# Patient Record
Sex: Male | Born: 1945 | Race: Black or African American | Hispanic: No | Marital: Married | Smoking: Never smoker
Health system: Southern US, Community
[De-identification: ages and names within clinical notes are randomized; demographics above are authoritative.]

## PROBLEM LIST (undated history)

## (undated) DIAGNOSIS — D649 Anemia, unspecified: Secondary | ICD-10-CM

## (undated) DIAGNOSIS — I10 Essential (primary) hypertension: Secondary | ICD-10-CM

---

## 2012-04-15 ENCOUNTER — Encounter: Payer: Self-pay | Admitting: Family Medicine

## 2012-04-15 ENCOUNTER — Ambulatory Visit (INDEPENDENT_AMBULATORY_CARE_PROVIDER_SITE_OTHER): Payer: Self-pay | Admitting: Family Medicine

## 2012-04-15 VITALS — BP 154/80 | HR 67 | Ht 69.75 in | Wt 198.0 lb

## 2012-04-15 DIAGNOSIS — I10 Essential (primary) hypertension: Secondary | ICD-10-CM

## 2012-04-15 LAB — COMPREHENSIVE METABOLIC PANEL
ALT: 19 U/L (ref 0–53)
AST: 23 U/L (ref 0–37)
Alkaline Phosphatase: 76 U/L (ref 39–117)
BUN: 13 mg/dL (ref 6–23)
Calcium: 9.1 mg/dL (ref 8.4–10.5)
Chloride: 105 mEq/L (ref 96–112)
Creat: 0.97 mg/dL (ref 0.50–1.35)

## 2012-04-15 MED ORDER — TRIAMTERENE-HCTZ 37.5-25 MG PO CAPS: 1.0000 | ORAL_CAPSULE | Freq: Every day | ORAL | Status: DC

## 2012-04-15 MED ORDER — ASPIRIN 81 MG PO TBEC: 81.0000 mg | DELAYED_RELEASE_TABLET | Freq: Every day | ORAL | Status: AC

## 2012-04-15 NOTE — Patient Instructions (Signed)
1.5 Gram Low Sodium Diet A 1.5 gram sodium diet restricts the amount of sodium in the diet to no more than 1.5 g or 1500 mg daily. The American Heart Association recommends Americans over the age of 20 to consume no more than 1500 mg of sodium each day to reduce the risk of developing high blood pressure. Research also shows that limiting sodium may reduce heart attack and stroke risk. Many foods contain sodium for flavor and sometimes as a preservative. When the amount of sodium in a diet needs to be low, it is important to know what to look for when choosing foods and drinks. The following includes some information and guidelines to help make it easier for you to adapt to a low sodium diet. QUICK TIPS  Do not add salt to food.   Avoid convenience items and fast food.   Choose unsalted snack foods.   Buy lower sodium products, often labeled as "lower sodium" or "no salt added."   Check food labels to learn how much sodium is in 1 serving.   When eating at a restaurant, ask that your food be prepared with less salt or none, if possible.  READING FOOD LABELS FOR SODIUM INFORMATION The nutrition facts label is a good place to find how much sodium is in foods. Look for products with no more than 400 mg of sodium per serving. Remember that 1.5 g = 1500 mg. The food label may also list foods as:  Sodium-free: Less than 5 mg in a serving.   Very low sodium: 35 mg or less in a serving.   Low-sodium: 140 mg or less in a serving.   Light in sodium: 50% less sodium in a serving. For example, if a food that usually has 300 mg of sodium is changed to become light in sodium, it will have 150 mg of sodium.   Reduced sodium: 25% less sodium in a serving. For example, if a food that usually has 400 mg of sodium is changed to reduced sodium, it will have 300 mg of sodium.  CHOOSING FOODS Grains  Avoid: Salted crackers and snack items. Some cereals, including instant hot cereals. Bread stuffing and  biscuit mixes. Seasoned rice or pasta mixes.   Choose: Unsalted snack items. Low-sodium cereals, oats, puffed wheat and rice, shredded wheat. English muffins and bread. Pasta.  Meats  Avoid: Salted, canned, smoked, spiced, pickled meats, including fish and poultry. Bacon, ham, sausage, cold cuts, hot dogs, anchovies.   Choose: Low-sodium canned tuna and salmon. Fresh or frozen meat, poultry, and fish.  Dairy  Avoid: Processed cheese and spreads. Cottage cheese. Buttermilk and condensed milk. Regular cheese.   Choose: Milk. Low-sodium cottage cheese. Yogurt. Sour cream. Low-sodium cheese.  Fruits and Vegetables  Avoid: Regular canned vegetables. Regular canned tomato sauce and paste. Frozen vegetables in sauces. Olives. Pickles. Relishes. Sauerkraut.   Choose: Low-sodium canned vegetables. Low-sodium tomato sauce and paste. Frozen or fresh vegetables. Fresh and frozen fruit.  Condiments  Avoid: Canned and packaged gravies. Worcestershire sauce. Tartar sauce. Barbecue sauce. Soy sauce. Steak sauce. Ketchup. Onion, garlic, and table salt. Meat flavorings and tenderizers.   Choose: Fresh and dried herbs and spices. Low-sodium varieties of mustard and ketchup. Lemon juice. Tabasco sauce. Horseradish.  SAMPLE 1.5 GRAM SODIUM MEAL PLAN Breakfast / Sodium (mg)  1 cup low-fat milk / 143 mg   1 whole-wheat English muffin / 240 mg   1 tbs heart-healthy margarine / 153 mg   1 hard-boiled egg /   139 mg   1 small orange / 0 mg  Lunch / Sodium (mg)  1 cup raw carrots / 76 mg   2 tbs no salt added peanut butter / 5 mg   2 slices whole-wheat bread / 270 mg   1 tbs jelly / 6 mg    cup red grapes / 2 mg  Dinner / Sodium (mg)  1 cup whole-wheat pasta / 2 mg   1 cup low-sodium tomato sauce / 73 mg   3 oz lean ground beef / 57 mg   1 small side salad (1 cup raw spinach leaves,  cup cucumber,  cup yellow bell pepper) with 1 tsp olive oil and 1 tsp red wine vinegar / 25 mg  Snack /  Sodium (mg)  1 container low-fat vanilla yogurt / 107 mg   3 graham cracker squares / 127 mg  Nutrient Analysis  Calories: 1745   Protein: 75 g   Carbohydrate: 237 g   Fat: 57 g   Sodium: 1425 mg  Document Released: 11/26/2005 Document Revised: 11/15/2011 Document Reviewed: 02/27/2010 [facility] Patient Information 2012 [place], [place].  Hypertension As your heart beats, it forces blood through your arteries. This force is your blood pressure. If the pressure is too high, it is called hypertension (HTN) or high blood pressure. HTN is dangerous because you may have it and not know it. High blood pressure may mean that your heart has to work harder to pump blood. Your arteries may be narrow or stiff. The extra work puts you at risk for heart disease, stroke, and other problems.  Blood pressure consists of two numbers, a higher number over a lower, 110/72, for example. It is stated as "110 over 72." The ideal is below 120 for the top number (systolic) and under 80 for the bottom (diastolic). Write down your blood pressure today. You should pay close attention to your blood pressure if you have certain conditions such as:  Heart failure.   Prior heart attack.   Diabetes   Chronic kidney disease.   Prior stroke.   Multiple risk factors for heart disease.  To see if you have HTN, your blood pressure should be measured while you are seated with your arm held at the level of the heart. It should be measured at least twice. A one-time elevated blood pressure reading (especially in the Emergency Department) does not mean that you need treatment. There may be conditions in which the blood pressure is different between your right and left arms. It is important to see your caregiver soon for a recheck. Most people have essential hypertension which means that there is not a specific cause. This type of high blood pressure may be lowered by changing lifestyle factors such as:  Stress.    Smoking.   Lack of exercise.   Excessive weight.   Drug/tobacco/alcohol use.   Eating less salt.  Most people do not have symptoms from high blood pressure until it has caused damage to the body. Effective treatment can often prevent, delay or reduce that damage. TREATMENT  When a cause has been identified, treatment for high blood pressure is directed at the cause. There are a large number of medications to treat HTN. These fall into several categories, and your caregiver will help you select the medicines that are best for you. Medications may have side effects. You should review side effects with your caregiver. If your blood pressure stays high after you have made lifestyle changes or started  on medicines,   Your medication(s) may need to be changed.   Other problems may need to be addressed.   Be certain you understand your prescriptions, and know how and when to take your medicine.   Be sure to follow up with your caregiver within the time frame advised (usually within two weeks) to have your blood pressure rechecked and to review your medications.   If you are taking more than one medicine to lower your blood pressure, make sure you know how and at what times they should be taken. Taking two medicines at the same time can result in blood pressure that is too low.  SEEK IMMEDIATE MEDICAL CARE IF:  You develop a severe headache, blurred or changing vision, or confusion.   You have unusual weakness or numbness, or a faint feeling.   You have severe chest or abdominal pain, vomiting, or breathing problems.  MAKE SURE YOU:   Understand these instructions.   Will watch your condition.   Will get help right away if you are not doing well or get worse.  Document Released: 11/26/2005 Document Revised: 11/15/2011 Document Reviewed: 07/16/2008 [facility] Patient Information 2012 [place], [place].

## 2012-04-21 NOTE — Progress Notes (Signed)
  Subjective:    Patient ID: Frank Daugherty, male    DOB: 1946/12/07, 66 y.o.   MRN: [id number]  HPI Pt presents today as a new pt visit.  Pt is pending [facility] set up.  No acute issues currently.   Has had informal diagnosis of HTN in the past per pt by previous physician. Has not been on medications for this in the past.  Has not been checknig BPs daily Does report some mild high salt intake.  No HA, CP, SOB.  No other past medical history.    Review of Systems See HPI, otherwise ROS negative.     Objective:   Physical Exam Gen: up in chair, NAD HEENT: NCAT, EOMI, TMs clear bilaterally CV: RRR, no murmurs auscultated PULM: CTAB, no wheezes, rales, rhoncii ABD: S/NT/+ bowel sounds  EXT: 2+ peripheral pulses         Assessment & Plan:

## 2012-04-23 ENCOUNTER — Encounter: Payer: Self-pay | Admitting: Family Medicine

## 2012-04-23 ENCOUNTER — Ambulatory Visit (INDEPENDENT_AMBULATORY_CARE_PROVIDER_SITE_OTHER): Payer: Self-pay | Admitting: Family Medicine

## 2012-04-23 VITALS — BP 127/83 | HR 87 | Temp 98.3°F | Ht 69.75 in | Wt 194.5 lb

## 2012-04-23 DIAGNOSIS — I10 Essential (primary) hypertension: Secondary | ICD-10-CM | POA: Insufficient documentation

## 2012-04-23 DIAGNOSIS — E785 Hyperlipidemia, unspecified: Secondary | ICD-10-CM

## 2012-04-23 LAB — BASIC METABOLIC PANEL
BUN: 23 mg/dL (ref 6–23)
CO2: 26 mEq/L (ref 19–32)
Chloride: 102 mEq/L (ref 96–112)
Creat: 1 mg/dL (ref 0.50–1.35)
Glucose, Bld: 116 mg/dL — ABNORMAL HIGH (ref 70–99)
Potassium: 4.2 mEq/L (ref 3.5–5.3)

## 2012-04-23 MED ORDER — PRAVASTATIN SODIUM 40 MG PO TABS: 40.0000 mg | ORAL_TABLET | Freq: Every day | ORAL | Status: DC

## 2012-04-23 NOTE — Progress Notes (Signed)
Subjective:  Pt is here for hypertension follow up: Pt is pending [employer] set up.  Hypertension: Checking BPS Daily ?:no BP Range:no Any HA, CP, SOB?:no Any Medication Side Effects:no Medication Compliance:yes Salt intake?:low Exercise?:no Weight Loss?:no    Review of Systems - Negative except as noted above in HPI   Objective:  Current Outpatient Prescriptions  Medication Sig Dispense Refill  . aspirin 81 MG EC tablet Take 1 tablet (81 mg total) by mouth daily. Swallow whole.  30 tablet  12  . pravastatin (PRAVACHOL) 40 MG tablet Take 1 tablet (40 mg total) by mouth daily.  90 tablet  3  . triamterene-hydrochlorothiazide (DYAZIDE) 37.5-25 MG per capsule Take 1 each (1 capsule total) by mouth daily.  30 capsule  6    Wt Readings from Last 3 Encounters:  04/23/12 194 lb 8 oz (88.225 kg)  04/15/12 198 lb (89.812 kg)   Temp Readings from Last 3 Encounters:  04/23/12 98.3 F (36.8 C) Oral   BP Readings from Last 3 Encounters:  04/23/12 127/83  04/15/12 154/80   Pulse Readings from Last 3 Encounters:  04/23/12 87  04/15/12 67     General: alert and cooperative HEENT: PERRLA and extra ocular movement intact Heart: S1, S2 normal, no murmur, rub or gallop, regular rate and rhythm Lungs: clear to auscultation, no wheezes or rales and unlabored breathing Abdomen: abdomen is soft without significant tenderness, masses, organomegaly or guarding Extremities: extremities normal, atraumatic, no cyanosis or edema Skin:no rashes Neurology: normal without focal findings   Assessment/Plan:

## 2012-04-23 NOTE — Assessment & Plan Note (Signed)
Started on pravachol.

## 2012-04-23 NOTE — Assessment & Plan Note (Signed)
Elevated BP. Will start on maxzide for this. Discussed importance of medication. Will also check general risk stratification labs pendig [facility] set up. Handout given. Follow up in 2-3 weeks pending [facility].

## 2012-04-23 NOTE — Assessment & Plan Note (Signed)
Controlled with maxzide. Will check Cr and K. Discussed monitoring.

## 2012-04-23 NOTE — Patient Instructions (Signed)

## 2020-01-05 ENCOUNTER — Emergency Department (HOSPITAL_COMMUNITY): Payer: Medicare Other

## 2020-01-05 ENCOUNTER — Inpatient Hospital Stay (HOSPITAL_COMMUNITY)
Admission: EM | Admit: 2020-01-05 | Discharge: 2020-01-14 | DRG: 309 | Disposition: A | Discharge status: Skilled Nursing Facility | Payer: Medicare Other | Attending: Internal Medicine | Admitting: Internal Medicine

## 2020-01-05 ENCOUNTER — Encounter (HOSPITAL_COMMUNITY): Payer: Self-pay | Admitting: Emergency Medicine

## 2020-01-05 ENCOUNTER — Other Ambulatory Visit: Payer: Self-pay

## 2020-01-05 DIAGNOSIS — D649 Anemia, unspecified: Secondary | ICD-10-CM | POA: Diagnosis present

## 2020-01-05 DIAGNOSIS — I480 Paroxysmal atrial fibrillation: Principal | ICD-10-CM | POA: Diagnosis present

## 2020-01-05 DIAGNOSIS — E785 Hyperlipidemia, unspecified: Secondary | ICD-10-CM | POA: Diagnosis present

## 2020-01-05 DIAGNOSIS — R339 Retention of urine, unspecified: Secondary | ICD-10-CM | POA: Diagnosis not present

## 2020-01-05 DIAGNOSIS — I340 Nonrheumatic mitral (valve) insufficiency: Secondary | ICD-10-CM | POA: Diagnosis not present

## 2020-01-05 DIAGNOSIS — E8809 Other disorders of plasma-protein metabolism, not elsewhere classified: Secondary | ICD-10-CM | POA: Diagnosis present

## 2020-01-05 DIAGNOSIS — K7689 Other specified diseases of liver: Secondary | ICD-10-CM | POA: Diagnosis present

## 2020-01-05 DIAGNOSIS — C7952 Secondary malignant neoplasm of bone marrow: Secondary | ICD-10-CM | POA: Diagnosis present

## 2020-01-05 DIAGNOSIS — I4891 Unspecified atrial fibrillation: Secondary | ICD-10-CM | POA: Diagnosis not present

## 2020-01-05 DIAGNOSIS — D684 Acquired coagulation factor deficiency: Secondary | ICD-10-CM | POA: Diagnosis present

## 2020-01-05 DIAGNOSIS — R739 Hyperglycemia, unspecified: Secondary | ICD-10-CM | POA: Diagnosis present

## 2020-01-05 DIAGNOSIS — I10 Essential (primary) hypertension: Secondary | ICD-10-CM | POA: Diagnosis not present

## 2020-01-05 DIAGNOSIS — R935 Abnormal findings on diagnostic imaging of other abdominal regions, including retroperitoneum: Secondary | ICD-10-CM | POA: Diagnosis not present

## 2020-01-05 DIAGNOSIS — E1165 Type 2 diabetes mellitus with hyperglycemia: Secondary | ICD-10-CM | POA: Diagnosis present

## 2020-01-05 DIAGNOSIS — R109 Unspecified abdominal pain: Secondary | ICD-10-CM

## 2020-01-05 DIAGNOSIS — N179 Acute kidney failure, unspecified: Secondary | ICD-10-CM

## 2020-01-05 DIAGNOSIS — N289 Disorder of kidney and ureter, unspecified: Secondary | ICD-10-CM | POA: Diagnosis not present

## 2020-01-05 DIAGNOSIS — K7290 Hepatic failure, unspecified without coma: Secondary | ICD-10-CM | POA: Diagnosis not present

## 2020-01-05 DIAGNOSIS — R945 Abnormal results of liver function studies: Secondary | ICD-10-CM

## 2020-01-05 DIAGNOSIS — D689 Coagulation defect, unspecified: Secondary | ICD-10-CM | POA: Diagnosis not present

## 2020-01-05 DIAGNOSIS — R748 Abnormal levels of other serum enzymes: Secondary | ICD-10-CM | POA: Diagnosis not present

## 2020-01-05 DIAGNOSIS — R7401 Elevation of levels of liver transaminase levels: Secondary | ICD-10-CM | POA: Diagnosis present

## 2020-01-05 DIAGNOSIS — F1011 Alcohol abuse, in remission: Secondary | ICD-10-CM | POA: Insufficient documentation

## 2020-01-05 DIAGNOSIS — D696 Thrombocytopenia, unspecified: Secondary | ICD-10-CM | POA: Diagnosis present

## 2020-01-05 DIAGNOSIS — I493 Ventricular premature depolarization: Secondary | ICD-10-CM | POA: Diagnosis present

## 2020-01-05 DIAGNOSIS — Z20822 Contact with and (suspected) exposure to covid-19: Secondary | ICD-10-CM | POA: Diagnosis present

## 2020-01-05 DIAGNOSIS — R7989 Other specified abnormal findings of blood chemistry: Secondary | ICD-10-CM

## 2020-01-05 HISTORY — DX: Essential (primary) hypertension: I10

## 2020-01-05 LAB — ACETAMINOPHEN LEVEL: Acetaminophen (Tylenol), Serum: <10 ug/mL — ABNORMAL LOW (ref 10–30)

## 2020-01-05 LAB — BASIC METABOLIC PANEL
Anion gap: 18 — ABNORMAL HIGH (ref 5–15)
BUN: 26 mg/dL — ABNORMAL HIGH (ref 8–23)
CO2: 20 mmol/L — ABNORMAL LOW (ref 22–32)
Calcium: 8.4 mg/dL — ABNORMAL LOW (ref 8.9–10.3)
Chloride: 105 mmol/L (ref 98–111)
Creatinine, Ser: 1.43 mg/dL — ABNORMAL HIGH (ref 0.61–1.24)
GFR calc Af Amer: 56 mL/min — ABNORMAL LOW (ref >60)
GFR calc non Af Amer: 48 mL/min — ABNORMAL LOW (ref >60)
Glucose, Bld: 244 mg/dL — ABNORMAL HIGH (ref 70–99)
Potassium: 4.5 mmol/L (ref 3.5–5.1)
Sodium: 143 mmol/L (ref 135–145)

## 2020-01-05 LAB — CBC
HCT: 35.6 % — ABNORMAL LOW (ref 39.0–52.0)
Hemoglobin: 11.6 g/dL — ABNORMAL LOW (ref 13.0–17.0)
MCH: 26.4 pg (ref 26.0–34.0)
MCHC: 32.6 g/dL (ref 30.0–36.0)
MCV: 81.1 fL (ref 80.0–100.0)
Platelets: 140 10*3/uL — ABNORMAL LOW (ref 150–400)
RBC: 4.39 MIL/uL (ref 4.22–5.81)
RDW: 15.8 % — ABNORMAL HIGH (ref 11.5–15.5)
WBC: 6.2 10*3/uL (ref 4.0–10.5)
nRBC: 3.7 % — ABNORMAL HIGH (ref 0.0–0.2)

## 2020-01-05 LAB — RESPIRATORY PANEL BY RT PCR (FLU A&B, COVID)
Influenza A by PCR: NEGATIVE
Influenza B by PCR: NEGATIVE
SARS Coronavirus 2 by RT PCR: NEGATIVE

## 2020-01-05 LAB — HEPATITIS PANEL, ACUTE
HCV Ab: NONREACTIVE
Hep A IgM: NONREACTIVE
Hep B C IgM: NONREACTIVE
Hepatitis B Surface Ag: NONREACTIVE

## 2020-01-05 LAB — HEPATIC FUNCTION PANEL
ALT: 49 U/L — ABNORMAL HIGH (ref 0–44)
AST: 83 U/L — ABNORMAL HIGH (ref 15–41)
Albumin: 1.9 g/dL — ABNORMAL LOW (ref 3.5–5.0)
Alkaline Phosphatase: 974 U/L — ABNORMAL HIGH (ref 38–126)
Bilirubin, Direct: 1.5 mg/dL — ABNORMAL HIGH (ref 0.0–0.2)
Indirect Bilirubin: 1.5 mg/dL — ABNORMAL HIGH (ref 0.3–0.9)
Total Bilirubin: 3 mg/dL — ABNORMAL HIGH (ref 0.3–1.2)
Total Protein: 6.5 g/dL (ref 6.5–8.1)

## 2020-01-05 LAB — PROTIME-INR
INR: 2.4 — ABNORMAL HIGH (ref 0.8–1.2)
Prothrombin Time: 26.2 seconds — ABNORMAL HIGH (ref 11.4–15.2)

## 2020-01-05 LAB — GLUCOSE, CAPILLARY: Glucose-Capillary: 167 mg/dL — ABNORMAL HIGH (ref 70–99)

## 2020-01-05 LAB — MAGNESIUM: Magnesium: 2.6 mg/dL — ABNORMAL HIGH (ref 1.7–2.4)

## 2020-01-05 LAB — TSH: TSH: 2.281 u[IU]/mL (ref 0.350–4.500)

## 2020-01-05 MED ORDER — SODIUM CHLORIDE 0.9% FLUSH
3.0000 mL | Freq: Two times a day (BID) | INTRAVENOUS | Status: DC
  Administered 2020-01-06 – 2020-01-14 (×12): 3 mL via INTRAVENOUS

## 2020-01-05 MED ORDER — DILTIAZEM HCL-DEXTROSE 125-5 MG/125ML-% IV SOLN (PREMIX)
5.0000 mg/h | INTRAVENOUS | Status: DC
  Filled 2020-01-05: qty 125

## 2020-01-05 MED ORDER — ONDANSETRON HCL 4 MG PO TABS: 4.0000 mg | ORAL_TABLET | Freq: Four times a day (QID) | ORAL | Status: DC | PRN

## 2020-01-05 MED ORDER — ONDANSETRON HCL 4 MG/2ML IJ SOLN: 4.0000 mg | Freq: Four times a day (QID) | INTRAMUSCULAR | Status: DC | PRN

## 2020-01-05 MED ORDER — DILTIAZEM LOAD VIA INFUSION
15.0000 mg | Freq: Once | INTRAVENOUS | Status: DC
  Filled 2020-01-05: qty 15

## 2020-01-05 MED ORDER — SODIUM CHLORIDE 0.9 % IV SOLN: INTRAVENOUS | Status: AC

## 2020-01-05 MED ORDER — INSULIN ASPART 100 UNIT/ML ~~LOC~~ SOLN
0.0000 [IU] | Freq: Three times a day (TID) | SUBCUTANEOUS | Status: DC
  Administered 2020-01-07: 1 [IU] via SUBCUTANEOUS
  Administered 2020-01-08 – 2020-01-09 (×2): 2 [IU] via SUBCUTANEOUS
  Administered 2020-01-10 (×2): 1 [IU] via SUBCUTANEOUS
  Administered 2020-01-14: 2 [IU] via SUBCUTANEOUS

## 2020-01-05 MED ORDER — INSULIN ASPART 100 UNIT/ML ~~LOC~~ SOLN
0.0000 [IU] | Freq: Every day | SUBCUTANEOUS | Status: DC
  Administered 2020-01-09: 2 [IU] via SUBCUTANEOUS

## 2020-01-05 MED ORDER — SODIUM CHLORIDE 0.9 % IV BOLUS
1000.0000 mL | Freq: Once | INTRAVENOUS | Status: AC
  Administered 2020-01-05: 1000 mL via INTRAVENOUS

## 2020-01-05 NOTE — ED Notes (Signed)
Main lab to add on hepatic function.

## 2020-01-05 NOTE — ED Provider Notes (Signed)
Sign out note  74 year old male presents to ER with generalized weakness.  EMS concern for A. fib with RVR, possible V. tach or A. fib with aberrancy.  Given 150 mg prior to arrival.  Here, likely A. fib versus sinus with sinus arrhythmia.  Labs with AKI, elevated INR (not on Coumadin).  Hepatic function, CBC pending.  Cardiology consulted, recommendations pending.  3:41 PM Received sign-out, plan to follow-up remainder of labs, consult hospitalist for admission  3:42 PM reviewed repeat ekg, Likely now sinus with pvcs and sinus arrhythmia; rate 108, will hold on cardizem, give additional fluids.     Lucrezia Starch, MD 01/05/20 [phone]

## 2020-01-05 NOTE — ED Notes (Signed)
Attempted report 

## 2020-01-05 NOTE — ED Provider Notes (Signed)
[facility] EMERGENCY DEPARTMENT Provider Note   CSN: [id number] Arrival date & time: 01/05/20  1337     History Chief Complaint  Patient presents with  . Atrial Fibrillation    Frank Daugherty is a 74 y.o. male.  He is brought in by EMS from home for evaluation of acute weakness.  It sounds like he was so weak he felt like he might pass out and, slumped on the wall down to the floor.  EMS found him in a wide-complex tachycardia and gave him 150 mg of amiodarone.  This change into her narrow complex irregular tachycardic rhythm.  He denies any recent illness.  No fevers or chills no chest pain no shortness of breath.  No nausea vomiting diarrhea.  No known cardiac disease.  Denies smoking or street drugs, said he quit drinking 3 months ago.  The history is provided by the patient.  Atrial Fibrillation This is a new problem. Episode onset: unknown. The problem has not changed since onset.Pertinent negatives include no chest pain, no abdominal pain, no headaches and no shortness of breath. Nothing aggravates the symptoms. Nothing relieves the symptoms. He has tried nothing for the symptoms. The treatment provided no relief.       No past medical history on file.  Patient Active Problem List   Diagnosis Date Noted  . HTN (hypertension) 04/23/2012  . HLD (hyperlipidemia) 04/23/2012    History reviewed. No pertinent surgical history.     No family history on file.  Social History   Tobacco Use  . Smoking status: Never Smoker  . Smokeless tobacco: Never Used  Substance Use Topics  . Alcohol use: Not on file  . Drug use: Not on file    Home Medications Prior to Admission medications   Medication Sig Start Date End Date Taking? Authorizing Provider  pravastatin (PRAVACHOL) 40 MG tablet Take 1 tablet (40 mg total) by mouth daily. 04/23/12 04/23/13  Deneise Lever, MD  triamterene-hydrochlorothiazide (DYAZIDE) 37.5-25 MG per capsule Take 1 each (1 capsule  total) by mouth daily. 04/15/12 04/15/13  Deneise Lever, MD    Allergies    Penicillins  Review of Systems   Review of Systems  Constitutional: Negative for fever.  HENT: Negative for sore throat.   Eyes: Negative for visual disturbance.  Respiratory: Negative for shortness of breath.   Cardiovascular: Negative for chest pain.  Gastrointestinal: Negative for abdominal pain.  Genitourinary: Negative for dysuria.  Musculoskeletal: Negative for joint swelling.  Skin: Negative for rash.  Neurological: Positive for weakness. Negative for headaches.    Physical Exam Updated Vital Signs BP 104/83   Pulse (!) 123   Temp 97.6 F (36.4 C) (Oral)   Resp (!) 24   SpO2 99%   Physical Exam Vitals and nursing note reviewed.  Constitutional:      Appearance: He is well-developed.  HENT:     Head: Normocephalic and atraumatic.  Eyes:     Conjunctiva/sclera: Conjunctivae normal.  Cardiovascular:     Rate and Rhythm: Tachycardia present. Rhythm irregular.     Heart sounds: No murmur.  Pulmonary:     Effort: Pulmonary effort is normal. No respiratory distress.     Breath sounds: Normal breath sounds.  Abdominal:     Palpations: Abdomen is soft.     Tenderness: There is no abdominal tenderness.  Musculoskeletal:        General: No deformity or signs of injury. Normal range of motion.  Cervical back: Neck supple.     Right lower leg: No edema.     Left lower leg: No edema.  Skin:    General: Skin is warm and dry.     Capillary Refill: Capillary refill takes less than 2 seconds.  Neurological:     General: No focal deficit present.     Mental Status: He is alert.     ED Results / Procedures / Treatments   Labs (all labs ordered are listed, but only abnormal results are displayed) Labs Reviewed  BASIC METABOLIC PANEL - Abnormal; Notable for the following components:      Result Value   CO2 20 (*)    Glucose, Bld 244 (*)    BUN 26 (*)    Creatinine, Ser 1.43 (*)     Calcium 8.4 (*)    GFR calc non Af Amer 48 (*)    GFR calc Af Amer 56 (*)    Anion gap 18 (*)    All other components within normal limits  MAGNESIUM - Abnormal; Notable for the following components:   Magnesium 2.6 (*)    All other components within normal limits  CBC - Abnormal; Notable for the following components:   Hemoglobin 11.6 (*)    HCT 35.6 (*)    RDW 15.8 (*)    Platelets 140 (*)    nRBC 3.7 (*)    All other components within normal limits  PROTIME-INR - Abnormal; Notable for the following components:   Prothrombin Time 26.2 (*)    INR 2.4 (*)    All other components within normal limits  HEPATIC FUNCTION PANEL - Abnormal; Notable for the following components:   Albumin 1.9 (*)    AST 83 (*)    ALT 49 (*)    Alkaline Phosphatase 974 (*)    Total Bilirubin 3.0 (*)    Bilirubin, Direct 1.5 (*)    Indirect Bilirubin 1.5 (*)    All other components within normal limits  RESPIRATORY PANEL BY RT PCR (FLU A&B, COVID)  TSH    EKG EKG Interpretation  Date/Time:  Tuesday January 05 2020 13:48:33 EST Ventricular Rate:  125 PR Interval:    QRS Duration: 88 QT Interval:  304 QTC Calculation: 439 R Axis:   -17 Text Interpretation: Atrial fibrillation Borderline left axis deviation No old tracing to compare Confirmed by Aletta Edouard [phone]) on 01/05/2020 1:52:21 PM   Radiology DG Chest Port 1 View  Result Date: 01/05/2020 CLINICAL DATA:  Weakness EXAM: PORTABLE CHEST 1 VIEW COMPARISON:  None. FINDINGS: The heart size and mediastinal contours are within normal limits. No focal airspace consolidation, pleural effusion, or pneumothorax. The visualized skeletal structures are unremarkable. IMPRESSION: No active disease. Electronically Signed   By: Davina Poke D.O.   On: 01/05/2020 14:15    Procedures .Critical Care Performed by: Hayden Rasmussen, MD Authorized by: Hayden Rasmussen, MD   Critical care provider statement:    Critical care time (minutes):  45    Critical care time was exclusive of:  Separately billable procedures and treating other patients   Critical care was necessary to treat or prevent imminent or life-threatening deterioration of the following conditions:  Circulatory failure and metabolic crisis   Critical care was time spent personally by me on the following activities:  Discussions with consultants, evaluation of patient's response to treatment, examination of patient, ordering and performing treatments and interventions, ordering and review of laboratory studies, ordering and review of radiographic studies,  pulse oximetry, re-evaluation of patient's condition, obtaining history from patient or surrogate, review of old charts and development of treatment plan with patient or surrogate   I assumed direction of critical care for this patient from another provider in my specialty: no     (including critical care time)  Medications Ordered in ED Medications  diltiazem (CARDIZEM) 1 mg/mL load via infusion 15 mg (0 mg Intravenous Hold 01/05/20 1625)    And  diltiazem (CARDIZEM) 125 mg in dextrose 5% 125 mL (1 mg/mL) infusion (0 mg/hr Intravenous Hold 01/05/20 1626)  sodium chloride 0.9 % bolus 1,000 mL (0 mLs Intravenous Stopped 01/05/20 1639)  sodium chloride 0.9 % bolus 1,000 mL (1,000 mLs Intravenous New Bag/Given 01/05/20 1639)    ED Course  I have reviewed the triage vital signs and the nursing notes.  Pertinent labs & imaging results that were available during my care of the patient were reviewed by me and considered in my medical decision making (see chart for details).  Clinical Course as of Jan 04 1730  Tue Jan 04, 2135  [id number] 74 year old male here after a near syncopal event found to be in possibly V. tach versus A. fib with aberrancy.  Received amiodarone and now is in A. fib with a rate in the 120s.  No known history of arrhythmia.  No medical complaints.   [MB]  [id number] Chest x-ray interpreted by me as no acute disease.    [MB]  1532 Discussed with Trish from [facility] who will have somebody consult on him.  His lab work is showing an elevated glucose and elevated creatinine and elevated INR with no prior labs to compare with.  Have added on liver function tests.   [MB]    Clinical Course User Index [MB] Hayden Rasmussen, MD   MDM Rules/Calculators/A&P                      Final Clinical Impression(s) / ED Diagnoses Final diagnoses:  Transaminitis  Abdominal pain  Atrial fibrillation with RVR [facility])    Rx / DC Orders ED Discharge Orders         Ordered    Amb referral to AFIB Clinic     01/05/20 1346           Hayden Rasmussen, MD 01/05/20 1733

## 2020-01-05 NOTE — Consult Note (Signed)
Cardiology Consultation:   Patient ID: Frank Daugherty; [id number]; 08-Nov-1946   Admit date: 01/05/2020 Date of Consult: 01/05/2020  Primary Care Provider: Patient, No Pcp Per Primary Cardiologist: New to [facility]  Patient Profile:   Frank Daugherty is a 74 y.o. male with a hx of HTN and HLD (not on medications) who is being seen today for the evaluation of new onset AF with RVR at the request of Dr. Melina Copa.  History of Present Illness:   Frank Daugherty is a 74 yo M with a hx as stated above who presented to the ED on 01/05/20 with acute onset of fatigue and weakness. He reports that for the last two weeks he has had decreased appetite and oral intake which has resulted in about a 5-10lb weight loss. He states that he lives here in [place] and his son was here from [place] to take him back to his house for the next week or two until he started feeling better. He denies anginal symptoms, SOB, palpitations, LE edema, orthopnea, recent fevers, chills, N/V/D. He has no prior hx of CAD or AF. Denies alcohol or tobacco use. He states that he does not follow regularly with a healthcare provider. Previously seen at [facility] at which time he was prescribed medications for HTN and HLD however he has not been taking these for a very long time. He was previously working as a Education officer, museum but has not worked in approximately 3 weeks. He has some concern for COVID on presentation given his occupation.   Given his abrupt symptoms, his son called EMS for transport to the ED for further evaluation. On EMS arrival he was noted to be in atrial fibrillation with runs of VT. 150mg  Amiodarone was given in route with improvement in VT however AF has persisted. EKG with AF and HR at 125bpm and no acute abnormalities. TSH WNL at 2.281. Creatinine elevated above remote baseline at 1.43 (was 1.00 in 2013). COVID screen was negative. CXR with no active cardiopulmomary disease. COVID screen negative.   Patient has since  converted to NSR with rates in the upper 90's to low 100's. He is currently asymptomatic and resting comfortably. the patient reports no prior hx of AF or CAD. He has had no recent illness. Denies chest pain, prior palpitations, no SOB or LE edema.   No past medical history on file.  The histories are not reviewed yet. Please review them in the "History" navigator section and refresh this [place].   Prior to Admission medications   Medication Sig Start Date End Date Taking? Authorizing Provider  pravastatin (PRAVACHOL) 40 MG tablet Take 1 tablet (40 mg total) by mouth daily. 04/23/12 04/23/13  Deneise Lever, MD  triamterene-hydrochlorothiazide (DYAZIDE) 37.5-25 MG per capsule Take 1 each (1 capsule total) by mouth daily. 04/15/12 04/15/13  Deneise Lever, MD    Inpatient Medications: Scheduled Meds: . diltiazem  15 mg Intravenous Once   Continuous Infusions: . diltiazem (CARDIZEM) infusion     PRN Meds:   Allergies:    Allergies  Allergen Reactions  . Penicillins Itching    Social History:   Social History   Socioeconomic History  . Marital status: Married    Spouse name: Not on file  . Number of children: Not on file  . Years of education: Not on file  . Highest education level: Not on file  Occupational History  . Not on file  Tobacco Use  . Smoking status: Never Smoker  .  Smokeless tobacco: Never Used  Substance and Sexual Activity  . Alcohol use: Not on file  . Drug use: Not on file  . Sexual activity: Not on file  Other Topics Concern  . Not on file  Social History Narrative  . Not on file   Social Determinants of Health   Financial Resource Strain:   . Difficulty of Paying Living Expenses: Not on file  Food Insecurity:   . Worried About Charity fundraiser in the Last Year: Not on file  . Ran Out of Food in the Last Year: Not on file  Transportation Needs:   . Lack of Transportation (Medical): Not on file  . Lack of Transportation (Non-Medical): Not  on file  Physical Activity:   . Days of Exercise per Week: Not on file  . Minutes of Exercise per Session: Not on file  Stress:   . Feeling of Stress : Not on file  Social Connections:   . Frequency of Communication with Friends and Family: Not on file  . Frequency of Social Gatherings with Friends and Family: Not on file  . Attends Religious Services: Not on file  . Active Member of Clubs or Organizations: Not on file  . Attends Archivist Meetings: Not on file  . Marital Status: Not on file  Intimate Partner Violence:   . Fear of Current or Ex-Partner: Not on file  . Emotionally Abused: Not on file  . Physically Abused: Not on file  . Sexually Abused: Not on file    Family History:   No family history on file. Family Status:  No family status information on file.    ROS:  Please see the history of present illness.  All other ROS reviewed and negative.     Physical Exam/Data:   Vitals:   01/05/20 1400 01/05/20 1415 01/05/20 1430 01/05/20 1445  BP: 104/83 112/87 100/66 (!) 133/98  Pulse: (!) 123 (!) 123  (!) 138  Resp: (!) 24 15 (!) 21 (!) 22  Temp:      TempSrc:      SpO2: 99% 100%  99%   No intake or output data in the 24 hours ending 01/05/20 1525 There were no vitals filed for this visit. There is no height or weight on file to calculate BMI.   General: Frail, NAD Skin: Warm, dry, intact  Neck: Negative for carotid bruits. No JVD Lungs:Clear to ausculation bilaterally. No wheezes, rales, or rhonchi. Breathing is unlabored. Cardiovascular: RRR with S1 S2. No murmurs Abdomen: Soft, non-tender, non-distended. No obvious abdominal masses. Extremities: No edema. DP pulses 2+ bilaterally Neuro: Alert and oriented. No focal deficits. No facial asymmetry. MAE spontaneously. Psych: Responds to questions appropriately with normal affect.     EKG:  The EKG was personally reviewed and demonstrates: 01/05/20 AF with RVR, HR 125bpm with no acute abnormality    Telemetry:  Telemetry was personally reviewed and demonstrates: 01/05/20 NSR/ST with HRs in the 90-100 range   Relevant CV Studies:  ECHO: None   CATH: None   Laboratory Data:  Chemistry Recent Labs  Lab 01/05/20 1433  NA 143  K 4.5  CL 105  CO2 20*  GLUCOSE 244*  BUN 26*  CREATININE 1.43*  CALCIUM 8.4*  GFRNONAA 48*  GFRAA 56*  ANIONGAP 18*    Total Protein  Date Value Ref Range Status  04/15/2012 6.9 6.0 - 8.3 g/dL Final   Albumin  Date Value Ref Range Status  04/15/2012 4.1 3.5 -  5.2 g/dL Final   AST  Date Value Ref Range Status  04/15/2012 23 0 - 37 U/L Final   ALT  Date Value Ref Range Status  04/15/2012 19 0 - 53 U/L Final   Alkaline Phosphatase  Date Value Ref Range Status  04/15/2012 76 39 - 117 U/L Final   Total Bilirubin  Date Value Ref Range Status  04/15/2012 0.7 0.3 - 1.2 mg/dL Final   HematologyNo results for input(s): WBC, RBC, HGB, HCT, MCV, MCH, MCHC, RDW, PLT in the last 168 hours. Cardiac EnzymesNo results for input(s): TROPONINI in the last 168 hours. No results for input(s): TROPIPOC in the last 168 hours.  BNPNo results for input(s): BNP, PROBNP in the last 168 hours.  DDimer No results for input(s): DDIMER in the last 168 hours. TSH: No results found for: TSH Lipids: Lab Results  Component Value Date   LDLDIRECT 111 (H) 04/15/2012   HgbA1c:No results found for: HGBA1C  Radiology/Studies:  DG Chest Port 1 View  Result Date: 01/05/2020 CLINICAL DATA:  Weakness EXAM: PORTABLE CHEST 1 VIEW COMPARISON:  None. FINDINGS: The heart size and mediastinal contours are within normal limits. No focal airspace consolidation, pleural effusion, or pneumothorax. The visualized skeletal structures are unremarkable. IMPRESSION: No active disease. Electronically Signed   By: Davina Poke D.O.   On: 01/05/2020 14:15   Assessment and Plan:   1. New onset atrial fibrillation/episodes of WCT: -Pt presented from home with acute onset of  fatigue. Patient reports feeling poorly with decreased appetite for the last two weeks. He admits to decreased oral intake and has lost approximately 5-10lb in that time. He reports that he began to feel very weak to the point he felt like he may pass out. His son called EMS and was found to be in AF with RVR. Per chart review, he was having episodes of wide complex tachycardia which settled with the administration of Amiodarone 150mg  IV push -In the ED, CXR was WNL. TSH normal at 2.281. EKG with AF without acute changes, with HR 125bpm.  -He has since converted to NSR/ST with rates in the 90-100 range. Likely his AF is secondary to dehydration.  -Would recommend fluid volume resuscitation and follow renal function for improvement.  -Would also recommend an echocardiogram to evaluate for structural abnormalities.  -Once BP responds to IVF, would consider the addition of low dose BB.      2. Acute kidney insufficiency: -Presented with acute onset of fatigue found to be in AF with RVR -Creatinine found to be elevated at 1.43, up from prior lab work available in [facility] at 1.00 -Appears to be in the setting of dehydration -As above, would recommend IVF hydration and follow renal function  3. Decreased appetite with elevated LFTs/coagulopathy: -Unclear etiology -AST/ALT elevated at 83/49 -INR, 2.4 with a PTT at 26.2 -Denies recent change in bowel, no abdominal pain  -Would benefit from further IM assistance for further workup  4. HTN: -Stable, 93/78>91/73>92/65 -Not currently on home antihypertensives  -Would use gentle IVF hydration and follow response closely   5. HLD: -Obtain current lipid panel -No statin given increased liver function    Active Problems:   * No active hospital problems. *   For questions or updates, please contact [place] Please consult [email or web address] for contact info under Cardiology/STEMI.   SignedKathyrn Drown NP-C [facility] Pager:  [phone] 01/05/2020 3:25 PM

## 2020-01-05 NOTE — ED Triage Notes (Signed)
Pt here from home where he was walking and had sudden onset of weakness, which made him feel like he needed to sit down. Pt in afib RVR with runs of Vtach with EMS. 150 mg amio given PTA, with improvement in vtach but arrives still in afib. Pt's only complaint is weakness when standing.

## 2020-01-05 NOTE — H&P (Signed)
History and Physical    Frank Daugherty [id number] DOB: 07-Jun-1946 DOA: 01/05/2020  PCP: Patient, No Pcp Per   Patient coming from: Home   Chief Complaint: Near-syncope   HPI: Frank Daugherty is a 74 y.o. male with medical history significant for hypertension, has not seen a physician in several years, and now presents the ED after an episode of near syncope.  Patient reports he been in his usual state of health until he developed acute lightheadedness and generalized weakness upon standing, feeling as though he might pass out, and improved some after sitting down.  He denies any recent chest pain or shortness of breath, denies leg swelling or tenderness, and denies any recent fevers or chills.  He does not take any medications regularly, reports drinking alcohol every 2 or 3 days, usually 2 beers on each occasion.  He reports a remote history of "jaundice," but does not remember what this was attributed to your what type of work-up he had.  He denies any abdominal pain or vomiting, had some loose stools recently, denies any melena or hematochezia.  ED Course: Upon arrival to the ED, patient is found to be afebrile, saturating low 90s on room air, slightly tachypneic, tachycardic to the 130s, and with blood pressure 90/64.  EKG features atrial fibrillation with rate 125.  Chest x-ray is negative for acute cardiopulmonary disease.  Right upper quadrant ultrasound with slightly distended gallbladder with sludge but no wall thickening or Murphy sign to suggest acute gallbladder disease.  CBD dimensions are upper limit of normal on the ultrasound.  Chemistry panel concerning for alkaline phosphatase 974, albumin 1.9, AST 83, ALT 49, total bilirubin 3.0, and creatinine 1.43, all normal in 2013.  COVID-19 PCR is negative.  CBC with mild anemia and thrombocytopenia.  INR is elevated to 2.4.  Cardiology and gastroenterology were consulted by the ED physician, 1 L normal saline was administered, and hospitalists  asked to admit.   Review of Systems:  All other systems reviewed and apart from HPI, are negative.  Past Medical History:  Diagnosis Date  . Hypertension     History reviewed. No pertinent surgical history.   reports that he has never smoked. He has never used smokeless tobacco. He reports current alcohol use of about 4.0 standard drinks of alcohol per week. He reports that he does not use drugs.  Allergies  Allergen Reactions  . Penicillins Itching    Did it involve swelling of the face/tongue/throat, SOB, or low BP? Unk Did it involve sudden or severe rash/hives, skin peeling, or any reaction on the inside of your mouth or nose? No Did you need to seek medical attention at a hospital or doctor's office? No When did it last happen? "It happened a long time ago" If all above answers are "NO", may proceed with cephalosporin use.     Family History  Family history unknown: Yes     Prior to Admission medications   Medication Sig Start Date End Date Taking? Authorizing Provider  OVER THE COUNTER MEDICATION Take 1 tablet by mouth See admin instructions. OTC Espira Multivitamin for males: Take 1 tablet by mouth once a day with food   Yes [provider]  pravastatin (PRAVACHOL) 40 MG tablet Take 1 tablet (40 mg total) by mouth daily. Patient not taking: Reported on 01/05/2020 04/23/12 04/23/13  Deneise Lever, MD  triamterene-hydrochlorothiazide (DYAZIDE) 37.5-25 MG per capsule Take 1 each (1 capsule total) by mouth daily. Patient not taking: Reported on 01/05/2020 04/15/12  04/15/13  Deneise Lever, MD    Physical Exam: Vitals:   01/05/20 1815 01/05/20 1845 01/05/20 1915 01/05/20 1945  BP: 107/72 116/90 (!) 113/95 108/79  Pulse: 94   98  Resp: 19 (!) 22 18 20   Temp:      TempSrc:      SpO2: 100%   100%    Constitutional: NAD, calm  Eyes: PERTLA, lids and conjunctivae normal ENMT: Mucous membranes are moist. Posterior pharynx clear of any exudate or lesions.   Neck:  normal, supple, no masses, no thyromegaly Respiratory:  no wheezing, no crackles. No accessory muscle use.  Cardiovascular: Rate ~100 and irregularly irregular. No extremity edema.   Abdomen: No distension, no tenderness, soft. Bowel sounds active.  Musculoskeletal: no clubbing / cyanosis. No joint deformity upper and lower extremities.   Skin: no significant rashes, lesions, ulcers. Warm, dry, well-perfused. Neurologic: CN 2-12 grossly intact. Sensation intact. Strength 5/5 in all 4 limbs.  Psychiatric: Alert and appropriate throughout interview and exam. Pleasant and cooperative.    Labs and Imaging on Admission: I have personally reviewed following labs and imaging studies  CBC: Recent Labs  Lab 01/05/20 1433  WBC 6.2  HGB 11.6*  HCT 35.6*  MCV 81.1  PLT [id number]*   Basic Metabolic Panel: Recent Labs  Lab 01/05/20 1433  NA 143  K 4.5  CL 105  CO2 20*  GLUCOSE 244*  BUN 26*  CREATININE 1.43*  CALCIUM 8.4*  MG 2.6*   GFR: CrCl cannot be calculated (Unknown ideal weight.). Liver Function Tests: Recent Labs  Lab 01/05/20 1530  AST 83*  ALT 49*  ALKPHOS 974*  BILITOT 3.0*  PROT 6.5  ALBUMIN 1.9*   No results for input(s): LIPASE, AMYLASE in the last 168 hours. No results for input(s): AMMONIA in the last 168 hours. Coagulation Profile: Recent Labs  Lab 01/05/20 1433  INR 2.4*   Cardiac Enzymes: No results for input(s): CKTOTAL, CKMB, CKMBINDEX, TROPONINI in the last 168 hours. BNP (last 3 results) No results for input(s): PROBNP in the last 8760 hours. HbA1C: No results for input(s): HGBA1C in the last 72 hours. CBG: No results for input(s): GLUCAP in the last 168 hours. Lipid Profile: No results for input(s): CHOL, HDL, LDLCALC, TRIG, CHOLHDL, LDLDIRECT in the last 72 hours. Thyroid Function Tests: Recent Labs    01/05/20 1433  TSH 2.281   Anemia Panel: No results for input(s): VITAMINB12, FOLATE, FERRITIN, TIBC, IRON, RETICCTPCT in the last 72  hours. Urine analysis: No results found for: COLORURINE, APPEARANCEUR, LABSPEC, PHURINE, GLUCOSEU, HGBUR, BILIRUBINUR, KETONESUR, PROTEINUR, UROBILINOGEN, NITRITE, LEUKOCYTESUR Sepsis Labs: @LABRCNTIP (procalcitonin:4,lacticidven:4) ) Recent Results (from the past 240 hour(s))  Respiratory Panel by RT PCR (Flu A&B, Covid) - Nasopharyngeal Swab     Status: None   Collection Time: 01/05/20  2:43 PM   Specimen: Nasopharyngeal Swab  Result Value Ref Range Status   SARS Coronavirus 2 by RT PCR NEGATIVE NEGATIVE Final    Comment: (NOTE) SARS-CoV-2 target nucleic acids are NOT DETECTED. The SARS-CoV-2 RNA is generally detectable in upper respiratoy specimens during the acute phase of infection. The lowest concentration of SARS-CoV-2 viral copies this assay can detect is 131 copies/mL. A negative result does not preclude SARS-Cov-2 infection and should not be used as the sole basis for treatment or other patient management decisions. A negative result may occur with  improper specimen collection/handling, submission of specimen other than nasopharyngeal swab, presence of viral mutation(s) within the areas targeted by this assay, and  inadequate number of viral copies (<131 copies/mL). A negative result must be combined with clinical observations, patient history, and epidemiological information. The expected result is Negative. Fact Sheet for Patients:  PinkCheek.be Fact Sheet for Healthcare Providers:  GravelBags.it This test is not yet ap proved or cleared by the [place] FDA and  has been authorized for detection and/or diagnosis of SARS-CoV-2 by FDA under an Emergency Use Authorization (EUA). This EUA will remain  in effect (meaning this test can be used) for the duration of the COVID-19 declaration under Section 564(b)(1) of the Act, 21 U.S.C. section 360bbb-3(b)(1), unless the authorization is terminated or revoked  sooner.    Influenza A by PCR NEGATIVE NEGATIVE Final   Influenza B by PCR NEGATIVE NEGATIVE Final    Comment: (NOTE) The Xpert Xpress SARS-CoV-2/FLU/RSV assay is intended as an aid in  the diagnosis of influenza from Nasopharyngeal swab specimens and  should not be used as a sole basis for treatment. Nasal washings and  aspirates are unacceptable for Xpert Xpress SARS-CoV-2/FLU/RSV  testing. Fact Sheet for Patients: PinkCheek.be Fact Sheet for Healthcare Providers: GravelBags.it This test is not yet approved or cleared by the [place] FDA and  has been authorized for detection and/or diagnosis of SARS-CoV-2 by  FDA under an Emergency Use Authorization (EUA). This EUA will remain  in effect (meaning this test can be used) for the duration of the  Covid-19 declaration under Section 564(b)(1) of the Act, 21  U.S.C. section 360bbb-3(b)(1), unless the authorization is  terminated or revoked. Performed at [facility], [place] [address] [place], [place] [address]      Radiological Exams on Admission: DG Chest Port 1 View  Result Date: 01/05/2020 CLINICAL DATA:  Weakness EXAM: PORTABLE CHEST 1 VIEW COMPARISON:  None. FINDINGS: The heart size and mediastinal contours are within normal limits. No focal airspace consolidation, pleural effusion, or pneumothorax. The visualized skeletal structures are unremarkable. IMPRESSION: No active disease. Electronically Signed   By: Davina Poke D.O.   On: 01/05/2020 14:15   US Abdomen Limited RUQ  Result Date: 01/05/2020 CLINICAL DATA:  Abdominal pain for 2 weeks EXAM: ULTRASOUND ABDOMEN LIMITED RIGHT UPPER QUADRANT COMPARISON:  None. Chest x-ray 01/05/2020 FINDINGS: Gallbladder: Slightly distended. Moderate sludge in the gallbladder. No shadowing stones. Normal wall thickness. Negative sonographic Murphy Common bile duct: Diameter: 6 mm Liver: Liver is slightly echogenic. Multiple  cysts within the liver. The largest is seen in the posterior right hepatic lobe and measures 2.3 x 1.8 x 1.6 cm. Portal vein is patent on color Doppler imaging with normal direction of blood flow towards the liver. Other: None. IMPRESSION: 1. Slightly distended gallbladder with sludge, but negative for wall thickness, sonographic Murphy, or other features to suggest acute gallbladder disease. Common duct diameter upper limits of normal 2. Slightly echogenic liver with multiple cysts Electronically Signed   By: Donavan Foil M.D.   On: 01/05/2020 18:07    EKG: Independently reviewed. Atrial fibrillation with RVR, rate 125.   Assessment/Plan   1. Atrial fibrillation with RVR  - Presents with near-syncope and found to be in atrial fibrillation with RVR  - Cardiology consulting and much appreciated  - No angina to suggest ischemic etiology, TSH is normal  - Likely precipitated by underlying disease process and with coagulopathy, anticoagulation not recommended for now  - Continue cardiac monitoring, use beta-blocker as needed for rate-control, continue IVF hydration, check echocardiogram   2. Liver disease; coagulopathy   - Presents with  lightheadedness/near-syncope, and is found to have grossly abnormal LFT's with INR 2.4  - No abdominal pain or obstructive process on RUQ [place]  - He denies any significant EtOH use but believes he may have had "jaundice" many years ago  - Check APAP level and viral hepatitis panel, trend LFT's and INR, follow-up GI recommendations, continue supportive care    3. Renal insufficiency  - SCr is 1.43 in ED, up from 1 in 2013  - Check FENa, continue IVF hydration, renally-dose medications, repeat chem panel in am   4. Hypertension  - BP low-normal in ED  - He was prescribed antihypertensives several years ago but has not taken recently, will continue to hold for now    5. Hyperglycemia  - Serum glucose is 244 in ED  - Check A1c, monitor CBG's and use a  low-intensity SSI with Novolog if needed    DVT prophylaxis: SCD's  Code Status: Full  Family Communication: Discussed with patient  Consults called: Cardiology and GI consulted by ED physician  Admission status: Inpatient. Patient has multiorgan failure and will require ongoing inpatient assessment and stabilization with multiple specialists consulting and is not reasonable expected to be stable for discharge within observation timeframe.    Vianne Bulls, MD [facility] Pager: See [email or web address]  If 7AM-7PM, please contact the daytime attending [email or web address]  01/05/2020, 8:30 PM

## 2020-01-06 ENCOUNTER — Inpatient Hospital Stay (HOSPITAL_COMMUNITY): Payer: Medicare Other

## 2020-01-06 DIAGNOSIS — R945 Abnormal results of liver function studies: Secondary | ICD-10-CM

## 2020-01-06 DIAGNOSIS — I340 Nonrheumatic mitral (valve) insufficiency: Secondary | ICD-10-CM

## 2020-01-06 LAB — CBC WITH DIFFERENTIAL/PLATELET
Abs Immature Granulocytes: 0.33 10*3/uL — ABNORMAL HIGH (ref 0.00–0.07)
Basophils Absolute: 0 10*3/uL (ref 0.0–0.1)
Basophils Relative: 0 %
Eosinophils Absolute: 0 10*3/uL (ref 0.0–0.5)
Eosinophils Relative: 0 %
HCT: 29.6 % — ABNORMAL LOW (ref 39.0–52.0)
Hemoglobin: 9.9 g/dL — ABNORMAL LOW (ref 13.0–17.0)
Immature Granulocytes: 6 %
Lymphocytes Relative: 47 %
Lymphs Abs: 2.4 10*3/uL (ref 0.7–4.0)
MCH: 26.5 pg (ref 26.0–34.0)
MCHC: 33.4 g/dL (ref 30.0–36.0)
MCV: 79.4 fL — ABNORMAL LOW (ref 80.0–100.0)
Monocytes Absolute: 0.3 10*3/uL (ref 0.1–1.0)
Monocytes Relative: 5 %
Neutro Abs: 2.2 10*3/uL (ref 1.7–7.7)
Neutrophils Relative %: 42 %
Platelets: 127 10*3/uL — ABNORMAL LOW (ref 150–400)
RBC: 3.73 MIL/uL — ABNORMAL LOW (ref 4.22–5.81)
RDW: 15.6 % — ABNORMAL HIGH (ref 11.5–15.5)
WBC: 5.1 10*3/uL (ref 4.0–10.5)
nRBC: 2.1 % — ABNORMAL HIGH (ref 0.0–0.2)

## 2020-01-06 LAB — URINALYSIS, COMPLETE (UACMP) WITH MICROSCOPIC
Bilirubin Urine: NEGATIVE
Glucose, UA: NEGATIVE mg/dL
Hgb urine dipstick: NEGATIVE
Ketones, ur: NEGATIVE mg/dL
Leukocytes,Ua: NEGATIVE
Nitrite: NEGATIVE
Protein, ur: 30 mg/dL — AB
Specific Gravity, Urine: 1.023 (ref 1.005–1.030)
pH: 5 (ref 5.0–8.0)

## 2020-01-06 LAB — COMPREHENSIVE METABOLIC PANEL
ALT: 43 U/L (ref 0–44)
AST: 65 U/L — ABNORMAL HIGH (ref 15–41)
Albumin: 1.8 g/dL — ABNORMAL LOW (ref 3.5–5.0)
Alkaline Phosphatase: 882 U/L — ABNORMAL HIGH (ref 38–126)
Anion gap: 10 (ref 5–15)
BUN: 21 mg/dL (ref 8–23)
CO2: 24 mmol/L (ref 22–32)
Calcium: 8.1 mg/dL — ABNORMAL LOW (ref 8.9–10.3)
Chloride: 108 mmol/L (ref 98–111)
Creatinine, Ser: 0.9 mg/dL (ref 0.61–1.24)
GFR calc Af Amer: >60 mL/min (ref >60)
GFR calc non Af Amer: >60 mL/min (ref >60)
Glucose, Bld: 131 mg/dL — ABNORMAL HIGH (ref 70–99)
Potassium: 4.3 mmol/L (ref 3.5–5.1)
Sodium: 142 mmol/L (ref 135–145)
Total Bilirubin: 1.5 mg/dL — ABNORMAL HIGH (ref 0.3–1.2)
Total Protein: 5.6 g/dL — ABNORMAL LOW (ref 6.5–8.1)

## 2020-01-06 LAB — GLUCOSE, CAPILLARY
Glucose-Capillary: 116 mg/dL — ABNORMAL HIGH (ref 70–99)
Glucose-Capillary: 135 mg/dL — ABNORMAL HIGH (ref 70–99)
Glucose-Capillary: 111 mg/dL — ABNORMAL HIGH (ref 70–99)
Glucose-Capillary: 119 mg/dL — ABNORMAL HIGH (ref 70–99)

## 2020-01-06 LAB — CREATININE, URINE, RANDOM: Creatinine, Urine: 153.97 mg/dL

## 2020-01-06 LAB — SODIUM, URINE, RANDOM: Sodium, Ur: <10 mmol/L

## 2020-01-06 LAB — HEMOGLOBIN A1C
Hgb A1c MFr Bld: 6.9 % — ABNORMAL HIGH (ref 4.8–5.6)
Mean Plasma Glucose: 151.33 mg/dL

## 2020-01-06 LAB — PROTIME-INR
INR: 2 — ABNORMAL HIGH (ref 0.8–1.2)
Prothrombin Time: 22.6 seconds — ABNORMAL HIGH (ref 11.4–15.2)

## 2020-01-06 LAB — ECHOCARDIOGRAM COMPLETE

## 2020-01-06 LAB — GAMMA GT: GGT: 151 U/L — ABNORMAL HIGH (ref 7–50)

## 2020-01-06 MED ORDER — SODIUM CHLORIDE 0.9 % IV SOLN: INTRAVENOUS | Status: DC

## 2020-01-06 MED ORDER — SODIUM CHLORIDE 0.9 % IV BOLUS
500.0000 mL | Freq: Once | INTRAVENOUS | Status: AC
  Administered 2020-01-06: 500 mL via INTRAVENOUS

## 2020-01-06 MED ORDER — METOPROLOL TARTRATE 12.5 MG HALF TABLET
12.5000 mg | ORAL_TABLET | Freq: Two times a day (BID) | ORAL | Status: DC
  Administered 2020-01-06 (×2): 12.5 mg via ORAL
  Filled 2020-01-06 (×2): qty 1

## 2020-01-06 MED ORDER — GADOBUTROL 1 MMOL/ML IV SOLN
10.0000 mL | Freq: Once | INTRAVENOUS | Status: AC | PRN
  Administered 2020-01-06: 10 mL via INTRAVENOUS

## 2020-01-06 NOTE — Progress Notes (Signed)
Progress Note  Patient Name: Frank Daugherty Date of Encounter: 01/06/2020  Primary Cardiologist: Evalina Field, MD   Subjective   No chest pain, no SOB, no abd pain  Inpatient Medications    Scheduled Meds: . insulin aspart  0-5 Units Subcutaneous QHS  . insulin aspart  0-6 Units Subcutaneous TID WC  . sodium chloride flush  3 mL Intravenous Q12H   Continuous Infusions:  PRN Meds: ondansetron **OR** ondansetron (ZOFRAN) IV   Vital Signs    Vitals:   01/05/20 1945 01/05/20 2015 01/05/20 2147 01/06/20 0711  BP: 108/79 103/66 100/70 112/70  Pulse: 98 80 73 71  Resp: '20 17 18 18  ' Temp:   98 F (36.7 C) 98 F (36.7 C)  TempSrc:   Oral   SpO2: 100% 99% 97% 100%    Intake/Output Summary (Last 24 hours) at 01/06/2020 0808 Last data filed at 01/05/2020 1959 Gross per 24 hour  Intake 2000 ml  Output --  Net 2000 ml   Last 3 Weights 04/23/2012 04/15/2012  Weight (lbs) 194 lb 8 oz 198 lb  Weight (kg) 88.225 kg 89.812 kg      Telemetry    SR with PACs - Personally Reviewed  ECG    EKG at 1533 SR with PACs and PVCs no ST changes. - Personally Reviewed  Physical Exam   GEN: No acute distress.   Neck: No JVD Cardiac: RRR, with premature beats, no murmurs, rubs, or gallops.  Respiratory: Clear to auscultation bilaterally. GI: Soft, nontender, non-distended  MS: No edema; No deformity. Neuro:  Nonfocal  Psych: Normal affect   Labs    High Sensitivity Troponin:  No results for input(s): TROPONINIHS in the last 720 hours.    Chemistry Recent Labs  Lab 01/05/20 1433 01/05/20 1530 01/06/20 0338  NA 143  --  142  K 4.5  --  4.3  CL 105  --  108  CO2 20*  --  24  GLUCOSE 244*  --  131*  BUN 26*  --  21  CREATININE 1.43*  --  0.90  CALCIUM 8.4*  --  8.1*  PROT  --  6.5 5.6*  ALBUMIN  --  1.9* 1.8*  AST  --  83* 65*  ALT  --  49* 43  ALKPHOS  --  974* 882*  BILITOT  --  3.0* 1.5*  GFRNONAA 48*  --  >60  GFRAA 56*  --  >60  ANIONGAP 18*  --  10      Hematology Recent Labs  Lab 01/05/20 1433 01/06/20 0338  WBC 6.2 5.1  RBC 4.39 3.73*  HGB 11.6* 9.9*  HCT 35.6* 29.6*  MCV 81.1 79.4*  MCH 26.4 26.5  MCHC 32.6 33.4  RDW 15.8* 15.6*  PLT 140* 127*    BNPNo results for input(s): BNP, PROBNP in the last 168 hours.   DDimer No results for input(s): DDIMER in the last 168 hours.   Radiology    DG Chest Port 1 View  Result Date: 01/05/2020 CLINICAL DATA:  Weakness EXAM: PORTABLE CHEST 1 VIEW COMPARISON:  None. FINDINGS: The heart size and mediastinal contours are within normal limits. No focal airspace consolidation, pleural effusion, or pneumothorax. The visualized skeletal structures are unremarkable. IMPRESSION: No active disease. Electronically Signed   By: Davina Poke D.O.   On: 01/05/2020 14:15   US Abdomen Limited RUQ  Result Date: 01/05/2020 CLINICAL DATA:  Abdominal pain for 2 weeks EXAM: ULTRASOUND ABDOMEN LIMITED RIGHT UPPER  QUADRANT COMPARISON:  None. Chest x-ray 01/05/2020 FINDINGS: Gallbladder: Slightly distended. Moderate sludge in the gallbladder. No shadowing stones. Normal wall thickness. Negative sonographic Murphy Common bile duct: Diameter: 6 mm Liver: Liver is slightly echogenic. Multiple cysts within the liver. The largest is seen in the posterior right hepatic lobe and measures 2.3 x 1.8 x 1.6 cm. Portal vein is patent on color Doppler imaging with normal direction of blood flow towards the liver. Other: None. IMPRESSION: 1. Slightly distended gallbladder with sludge, but negative for wall thickness, sonographic Murphy, or other features to suggest acute gallbladder disease. Common duct diameter upper limits of normal 2. Slightly echogenic liver with multiple cysts Electronically Signed   By: Donavan Foil M.D.   On: 01/05/2020 18:07    Cardiac Studies   Echo scheduled  Patient Profile     74 y.o. male a hx of HTN and HLD (not on medications) admitted for near syncope and found to have a fib RVR with  conversion to SR elevated INR without medications..  There was a questions of VT but this was ruled out.    Assessment & Plan    Near syncope with tachycardia- PAF and now in SR.  With abnormal liver studies and elevated INR no anticoagulation given.  In process of GI eval.   PAF now in SR with PACs and PVCs would like to add BB but BP is soft at 100/70 to 112/70 possible to add lopressor 12.5 BID but will defer to Dr. Audie Box.  Unable to anticoagulate currently  Liver disease, coagulopathy  -GI eval.  Elevated alk phos, LFTs  --INR 2.0 today   AKI improved now Cr 0.90 with IV fluids.    Anemia/thrombocytopenia  with hgb drop from 11.6 to 9.9  plts 127   DM-2 new per IM  HTN hx but hypotensive on arrival now improving with fluids      For questions or updates, please contact [facility] Please consult [email or web address] for contact info under        Signed, Cecilie Kicks, NP  01/06/2020, 8:08 AM

## 2020-01-06 NOTE — Progress Notes (Signed)
  Echocardiogram 2D Echocardiogram has been performed.  Frank Daugherty 01/06/2020, 9:37 AM

## 2020-01-06 NOTE — Progress Notes (Signed)
Bladder scan done with 344 cc urine , In and out cath done , total urine out put 800 cc . Patient tolerated procedure well.

## 2020-01-06 NOTE — Progress Notes (Signed)
Progress Note    Frank Daugherty  [id number] DOB: 23-Mar-1946  DOA: 01/05/2020 PCP: Patient, No Pcp Per      Brief Narrative:    Medical records reviewed and are as summarized below:  Frank Daugherty is an 74 y.o. male with medical history significant for hypertension, has not seen a physician in several years, and now presents the ED after an episode of near syncope.  Patient said he was in his usual state of health until he developed acute lightheadedness and generalized weakness upon standing, feeling as though he might pass out, and improved some after sitting down.  ED Course: Upon arrival to the ED, patient is found to be afebrile, saturating low 90s on room air, slightly tachypneic, tachycardic to the 130s, and with blood pressure 90/64.  EKG features atrial fibrillation with rate 125.  Chest x-ray is negative for acute cardiopulmonary disease.  Right upper quadrant ultrasound with slightly distended gallbladder with sludge but no wall thickening or Murphy sign to suggest acute gallbladder disease.  CBD dimensions are upper limit of normal on the ultrasound.  Chemistry panel concerning for alkaline phosphatase 974, albumin 1.9, AST 83, ALT 49, total bilirubin 3.0, and creatinine 1.43, all normal in 2013.  COVID-19 PCR is negative.  CBC with mild anemia and thrombocytopenia.  INR is elevated to 2.4.  Cardiology and gastroenterology were consulted by the ED physician, 1 L normal saline was administered, and hospitalists asked to admit.        Assessment/Plan:   Principal Problem:   Atrial fibrillation with RVR (HCC) Active Problems:   HTN (hypertension)   HLD (hyperlipidemia)   Renal insufficiency   Coagulopathy (HCC)   Liver failure (HCC)   Hyperglycemia   1. Atrial fibrillation with RVR  - Presents with near-syncope.  2D echo showed EF estimated at 55 to 60%, moderately increased LVH.  Diastolic function could not be evaluated on echo. Continue metoprolol.  No  anticoagulation for now since she is coagulopathic.  Follow-up with cardiologist.  2. Liver disease; coagulopathy, hypoalbuminemia   - No abdominal pain or obstructive process on RUQ [place]  Hepatitis A, B and C were nonreactive.  MRCP abdomen is pending. Follow-up with the gastroenterologist.  3.   Acute kidney injury: Improved Creatinine has improved with IV fluids.  Urine studies suggest dehydration.  4. Hypertension  - Continue metoprolol  5. Type 2 diabetes mellitus with hyperglycemia  Hemoglobin A1c was 6.9. NovoLog as needed for hyperglycemia.     Family Communication/Anticipated D/C date and plan/Code Status   DVT prophylaxis: SCDs Code Status: Full code Family Communication: Plan discussed with patient Disposition Plan: To be determined      Subjective:   No abdominal pain, vomiting, shortness of breath or chest pain.  Objective:    Vitals:   01/05/20 2015 01/05/20 2147 01/06/20 0711 01/06/20 1700  BP: 103/66 100/70 112/70   Pulse: 80 73 71 70  Resp: 17 18 18    Temp:  98 F (36.7 C) 98 F (36.7 C) 99 F (37.2 C)  TempSrc:  Oral  Oral  SpO2: 99% 97% 100% 93%    Intake/Output Summary (Last 24 hours) at 01/06/2020 1710 Last data filed at 01/06/2020 1013 Gross per 24 hour  Intake 1000 ml  Output 25 ml  Net 975 ml   There were no vitals filed for this visit.  Exam:  GEN: NAD SKIN: No rash EYES: EOMI ENT: MMM CV: Irregular rate and rhythm PULM: CTA B ABD:  soft, ND, NT, +BS CNS: AAO x 3, non focal EXT: No edema or tenderness   Data Reviewed:   I have personally reviewed following labs and imaging studies:  Labs: Labs show the following:   Basic Metabolic Panel: Recent Labs  Lab 01/05/20 1433 01/06/20 0338  NA 143 142  K 4.5 4.3  CL 105 108  CO2 20* 24  GLUCOSE 244* 131*  BUN 26* 21  CREATININE 1.43* 0.90  CALCIUM 8.4* 8.1*  MG 2.6*  --    GFR CrCl cannot be calculated (Unknown ideal weight.). Liver Function Tests: Recent  Labs  Lab 01/05/20 1530 01/06/20 0338  AST 83* 65*  ALT 49* 43  ALKPHOS 974* 882*  BILITOT 3.0* 1.5*  PROT 6.5 5.6*  ALBUMIN 1.9* 1.8*   No results for input(s): LIPASE, AMYLASE in the last 168 hours. No results for input(s): AMMONIA in the last 168 hours. Coagulation profile Recent Labs  Lab 01/05/20 1433 01/06/20 0338  INR 2.4* 2.0*    CBC: Recent Labs  Lab 01/05/20 1433 01/06/20 0338  WBC 6.2 5.1  NEUTROABS  --  2.2  HGB 11.6* 9.9*  HCT 35.6* 29.6*  MCV 81.1 79.4*  PLT 140* 127*   Cardiac Enzymes: No results for input(s): CKTOTAL, CKMB, CKMBINDEX, TROPONINI in the last 168 hours. BNP (last 3 results) No results for input(s): PROBNP in the last 8760 hours. CBG: Recent Labs  Lab 01/05/20 2205 01/06/20 0748 01/06/20 1211 01/06/20 1702  GLUCAP 167* 116* 135* 111*   D-Dimer: No results for input(s): DDIMER in the last 72 hours. Hgb A1c: Recent Labs    01/06/20 0338  HGBA1C 6.9*   Lipid Profile: No results for input(s): CHOL, HDL, LDLCALC, TRIG, CHOLHDL, LDLDIRECT in the last 72 hours. Thyroid function studies: Recent Labs    01/05/20 1433  TSH 2.281   Anemia work up: No results for input(s): VITAMINB12, FOLATE, FERRITIN, TIBC, IRON, RETICCTPCT in the last 72 hours. Sepsis Labs: Recent Labs  Lab 01/05/20 1433 01/06/20 0338  WBC 6.2 5.1    Microbiology Recent Results (from the past 240 hour(s))  Respiratory Panel by RT PCR (Flu A&B, Covid) - Nasopharyngeal Swab     Status: None   Collection Time: 01/05/20  2:43 PM   Specimen: Nasopharyngeal Swab  Result Value Ref Range Status   SARS Coronavirus 2 by RT PCR NEGATIVE NEGATIVE Final    Comment: (NOTE) SARS-CoV-2 target nucleic acids are NOT DETECTED. The SARS-CoV-2 RNA is generally detectable in upper respiratoy specimens during the acute phase of infection. The lowest concentration of SARS-CoV-2 viral copies this assay can detect is 131 copies/mL. A negative result does not preclude  SARS-Cov-2 infection and should not be used as the sole basis for treatment or other patient management decisions. A negative result may occur with  improper specimen collection/handling, submission of specimen other than nasopharyngeal swab, presence of viral mutation(s) within the areas targeted by this assay, and inadequate number of viral copies (<131 copies/mL). A negative result must be combined with clinical observations, patient history, and epidemiological information. The expected result is Negative. Fact Sheet for Patients:  PinkCheek.be Fact Sheet for Healthcare Providers:  GravelBags.it This test is not yet ap proved or cleared by the [place] FDA and  has been authorized for detection and/or diagnosis of SARS-CoV-2 by FDA under an Emergency Use Authorization (EUA). This EUA will remain  in effect (meaning this test can be used) for the duration of the COVID-19 declaration under Section 564(b)(1) of  the Act, 21 U.S.C. section 360bbb-3(b)(1), unless the authorization is terminated or revoked sooner.    Influenza A by PCR NEGATIVE NEGATIVE Final   Influenza B by PCR NEGATIVE NEGATIVE Final    Comment: (NOTE) The Xpert Xpress SARS-CoV-2/FLU/RSV assay is intended as an aid in  the diagnosis of influenza from Nasopharyngeal swab specimens and  should not be used as a sole basis for treatment. Nasal washings and  aspirates are unacceptable for Xpert Xpress SARS-CoV-2/FLU/RSV  testing. Fact Sheet for Patients: PinkCheek.be Fact Sheet for Healthcare Providers: GravelBags.it This test is not yet approved or cleared by the [place] FDA and  has been authorized for detection and/or diagnosis of SARS-CoV-2 by  FDA under an Emergency Use Authorization (EUA). This EUA will remain  in effect (meaning this test can be used) for the duration of the  Covid-19  declaration under Section 564(b)(1) of the Act, 21  U.S.C. section 360bbb-3(b)(1), unless the authorization is  terminated or revoked. Performed at [facility], [place] [address] [place], [place] [address]     Procedures and diagnostic studies:  DG Chest Port 1 View  Result Date: 01/05/2020 CLINICAL DATA:  Weakness EXAM: PORTABLE CHEST 1 VIEW COMPARISON:  None. FINDINGS: The heart size and mediastinal contours are within normal limits. No focal airspace consolidation, pleural effusion, or pneumothorax. The visualized skeletal structures are unremarkable. IMPRESSION: No active disease. Electronically Signed   By: Davina Poke D.O.   On: 01/05/2020 14:15   ECHOCARDIOGRAM COMPLETE  Result Date: 01/06/2020   ECHOCARDIOGRAM REPORT   Patient Name:   YUL GEDDIE Date of Exam: 01/06/2020 Medical Rec #:  [id number]     Height:       69.8 in Accession #:    [id number]    Weight:       194.5 lb Date of Birth:  09-03-1946    BSA:          2.06 m Patient Age:    71 years      BP:           112/70 mmHg Patient Gender: M             HR:           120 bpm. Exam Location:  Inpatient Procedure: 2D Echo Indications:    Atrial fibrillation  History:        Patient has no prior history of Echocardiogram examinations.                 Risk Factors:Hypertension and Dyslipidemia.  Sonographer:    Clayton Lefort RDCS (AE) Referring Phys: [id number] [place]  1. Left ventricular ejection fraction, by visual estimation, is 55 to 60%. The left ventricle has normal function. There is mildly increased left ventricular hypertrophy.  2. Left ventricular diastolic function could not be evaluated.  3. The left ventricle has no regional wall motion abnormalities.  4. Global right ventricle has normal systolic function.The right ventricular size is normal. No increase in right ventricular wall thickness.  5. Left atrial size was normal.  6. Right atrial size was normal.  7. Mild mitral annular calcification.  8. The  mitral valve is grossly normal. Mild mitral valve regurgitation.  9. The tricuspid valve is normal in structure. 10. The tricuspid valve is normal in structure. Tricuspid valve regurgitation is trivial. 11. The aortic valve is tricuspid. Aortic valve regurgitation is not visualized. 12. The pulmonic valve was not well visualized. Pulmonic valve regurgitation is  not visualized. 13. The inferior vena cava is normal in size with greater than 50% respiratory variability, suggesting right atrial pressure of 3 mmHg. FINDINGS  Left Ventricle: Left ventricular ejection fraction, by visual estimation, is 55 to 60%. The left ventricle has normal function. The left ventricle has no regional wall motion abnormalities. There is mildly increased left ventricular hypertrophy. Concentric left ventricular hypertrophy. The left ventricular diastology could not be evaluated due to nondiagnostic images. Left ventricular diastolic function could not be evaluated. Right Ventricle: The right ventricular size is normal. No increase in right ventricular wall thickness. Global RV systolic function is has normal systolic function. Left Atrium: Left atrial size was normal in size. Right Atrium: Right atrial size was normal in size Pericardium: There is no evidence of pericardial effusion. Mitral Valve: The mitral valve is grossly normal. There is mild thickening of the mitral valve leaflet(s). There is mild calcification of the mitral valve leaflet(s). Mild mitral annular calcification. Mild mitral valve regurgitation. Tricuspid Valve: The tricuspid valve is normal in structure. Tricuspid valve regurgitation is trivial. Aortic Valve: The aortic valve is tricuspid. . There is mild thickening and mild calcification of the aortic valve. Aortic valve regurgitation is not visualized. There is mild thickening of the aortic valve. There is mild calcification of the aortic valve. Pulmonic Valve: The pulmonic valve was not well visualized. Pulmonic  valve regurgitation is not visualized. Pulmonic regurgitation is not visualized. Aorta: The aortic root, ascending aorta and aortic arch are all structurally normal, with no evidence of dilitation or obstruction. Pulmonary Artery: The pulmonary artery is not well seen. Venous: The inferior vena cava is normal in size with greater than 50% respiratory variability, suggesting right atrial pressure of 3 mmHg. IAS/Shunts: No atrial level shunt detected by color flow Doppler.  LEFT VENTRICLE PLAX 2D LVIDd:         4.70 cm LVIDs:         3.30 cm LV PW:         1.25 cm LV IVS:        1.40 cm LVOT diam:     2.00 cm LV SV:         58 ml LV SV Index:   27.70 LVOT Area:     3.14 cm  RIGHT VENTRICLE             IVC RV Basal diam:  3.10 cm     IVC diam: 1.20 cm RV S prime:     15.70 cm/s TAPSE (M-mode): 2.3 cm LEFT ATRIUM           Index       RIGHT ATRIUM           Index LA diam:      3.10 cm 1.51 cm/m  RA Area:     15.80 cm LA Vol (A2C): 42.8 ml 20.80 ml/m RA Volume:   40.20 ml  19.54 ml/m LA Vol (A4C): 53.2 ml 25.85 ml/m  AORTIC VALVE LVOT Vmax:   125.00 cm/s LVOT Vmean:  80.740 cm/s LVOT VTI:    0.227 m  AORTA Ao Root diam: 3.30 cm Ao Asc diam:  3.10 cm  SHUNTS Systemic VTI:  0.23 m Systemic Diam: 2.00 cm  Buford Dresser MD Electronically signed by Buford Dresser MD Signature Date/Time: 01/06/2020/1:14:41 PM    Final    US Abdomen Limited RUQ  Result Date: 01/05/2020 CLINICAL DATA:  Abdominal pain for 2 weeks EXAM: ULTRASOUND ABDOMEN LIMITED RIGHT UPPER QUADRANT COMPARISON:  None.  Chest x-ray 01/05/2020 FINDINGS: Gallbladder: Slightly distended. Moderate sludge in the gallbladder. No shadowing stones. Normal wall thickness. Negative sonographic Murphy Common bile duct: Diameter: 6 mm Liver: Liver is slightly echogenic. Multiple cysts within the liver. The largest is seen in the posterior right hepatic lobe and measures 2.3 x 1.8 x 1.6 cm. Portal vein is patent on color Doppler imaging with normal  direction of blood flow towards the liver. Other: None. IMPRESSION: 1. Slightly distended gallbladder with sludge, but negative for wall thickness, sonographic Murphy, or other features to suggest acute gallbladder disease. Common duct diameter upper limits of normal 2. Slightly echogenic liver with multiple cysts Electronically Signed   By: Donavan Foil M.D.   On: 01/05/2020 18:07    Medications:   . insulin aspart  0-5 Units Subcutaneous QHS  . insulin aspart  0-6 Units Subcutaneous TID WC  . metoprolol tartrate  12.5 mg Oral BID  . sodium chloride flush  3 mL Intravenous Q12H   Continuous Infusions:   LOS: 1 day   Paige Monarrez  [facility]     01/06/2020, 5:10 PM

## 2020-01-06 NOTE — Consult Note (Signed)
Referring Provider:  [facility]         Primary Care Physician:  Patient, No Pcp Per Primary Gastroenterologist: unassigned             We were asked to see this patient for:     Abnormal liver tests             ASSESSMENT /  PLAN     1. Abnormal liver tests, predominantly cholestatic pattern but also cannot exclude underlying chronic liver disease with thrombocytopenia, hypoalbuminemia, coagulopathy.  -No culprit medications identified. -He has been having intermittent upper abdominal pain radiating through to the back but atypical for biliary pain as it seems to improve with food. However, gallbladder sludge and CBD ULN on [place]. With degree of alk phos elevation would expect dilated CBD if this was extrahepatic obstruction unless he passed a stone. Will obtain MRCP to clear bile duct pathology.  -obtain GGT, 5 , 5 nucleotidase --Consider AMA for PBC if no other etiologies found  -some of the coagulopathy may be due to malnourishment especially given severe hypoalbuminemia. Cirrhosis not excluded but liver non-cirrhotic appearing on [place]   2. New Afib with RVR, ? Secondary to dehydration.   3. AKI, resolving.   HPI:    Chief Complaint: abnormal liver tests  Frank Daugherty is a 74 y.o. male pmh significant for HTN. Admitted with weakness / weight loss. Found by EMS to be in Afib with RVR (new) and runs of V tach. Improved with Amiodarone. Cardiology has evaluated and doesn't feel he had ventricular tachycardia. Feels Afib likely secondary to dehydration. He converted to NSR.  Not being anticoagulated and Amiodarone on hold now due to abnormal liver tests.   Patient hasn't seen a health care provider in years. He had an episode of jaundice in the 1950 but otherwise no history of liver problems. No [place] of liver disease. He drinks 4-5 beers / week, never been a heavy drinker. No street drugs.  Puts ginger in tea , takes a multivitamin but no herbs / supplements or NSAIDS.  Doesn't like  to take meds and in fact isn't taking BP meds.   Patient has lost a significant amount of weight over the last few weeks but doesn't know why. No N/V and again weight loss not due to abdominal pain as food helps / prevents the pain. No bowel changes or blood in stools   No fevers, no pruritis. No SOB or chest pain. No urinary sx.   Data Reviewed:   hgb 11.6 yesterday >>> 9.9 today MCV 79 Platelets 140 Na+ 142 BUN 26 >>> 21 today Cr 143 >>>> 0.9 today Alk phos 974>>> 882 today Alb 1.8 AST 83 yesterday >>>65 ALT 49 >>> 43 Tbili 3 >>> 1.5 today Direct bili 1.5 INR 2.4 >>> 2.0 Hepatitis panel negative.   RUQ [place]  Slightly distended gb with sludge, no findings of acute cholecystitis. CBD ULN at 51m, multiple liver cysts the largest 2.3 / 1.8 / 1.6 cm.   Past Medical History:  Diagnosis Date  . Hypertension     History reviewed. No pertinent surgical history.  Prior to Admission medications   Medication Sig Start Date End Date Taking? Authorizing Provider  OVER THE COUNTER MEDICATION Take 1 tablet by mouth See admin instructions. OTC Espira Multivitamin for males: Take 1 tablet by mouth once a day with food   Yes [provider]  pravastatin (PRAVACHOL) 40 MG tablet Take 1 tablet (40 mg total) by mouth  daily. Patient not taking: Reported on 01/05/2020 04/23/12 04/23/13  Deneise Lever, MD  triamterene-hydrochlorothiazide (DYAZIDE) 37.5-25 MG per capsule Take 1 each (1 capsule total) by mouth daily. Patient not taking: Reported on 01/05/2020 04/15/12 04/15/13  Deneise Lever, MD    Current Facility-Administered Medications  Medication Dose Route Frequency Provider Last Rate Last Admin  . insulin aspart (novoLOG) injection 0-5 Units  0-5 Units Subcutaneous QHS Opyd, Timothy S, MD      . insulin aspart (novoLOG) injection 0-6 Units  0-6 Units Subcutaneous TID WC Opyd, Ilene Qua, MD      . metoprolol tartrate (LOPRESSOR) tablet 12.5 mg  12.5 mg Oral BID Geralynn Rile, MD       . ondansetron [facility]) tablet 4 mg  4 mg Oral Q6H PRN Opyd, Ilene Qua, MD       Or  . ondansetron (ZOFRAN) injection 4 mg  4 mg Intravenous Q6H PRN Opyd, Ilene Qua, MD      . sodium chloride flush (NS) 0.9 % injection 3 mL  3 mL Intravenous Q12H Opyd, Ilene Qua, MD        Allergies as of 01/05/2020 - Review Complete 01/05/2020  Allergen Reaction Noted  . Penicillins Itching 04/15/2012    Family History  Family history unknown: Yes    Social History   Socioeconomic History  . Marital status: Married    Spouse name: Not on file  . Number of children: Not on file  . Years of education: Not on file  . Highest education level: Not on file  Occupational History  . Not on file  Tobacco Use  . Smoking status: Never Smoker  . Smokeless tobacco: Never Used  Substance and Sexual Activity  . Alcohol use: Yes    Alcohol/week: 4.0 standard drinks    Types: 4 Cans of beer per week  . Drug use: Never  . Sexual activity: Not on file  Other Topics Concern  . Not on file  Social History Narrative  . Not on file   Social Determinants of Health   Financial Resource Strain:   . Difficulty of Paying Living Expenses: Not on file  Food Insecurity:   . Worried About Charity fundraiser in the Last Year: Not on file  . Ran Out of Food in the Last Year: Not on file  Transportation Needs:   . Lack of Transportation (Medical): Not on file  . Lack of Transportation (Non-Medical): Not on file  Physical Activity:   . Days of Exercise per Week: Not on file  . Minutes of Exercise per Session: Not on file  Stress:   . Feeling of Stress : Not on file  Social Connections:   . Frequency of Communication with Friends and Family: Not on file  . Frequency of Social Gatherings with Friends and Family: Not on file  . Attends Religious Services: Not on file  . Active Member of Clubs or Organizations: Not on file  . Attends Archivist Meetings: Not on file  . Marital Status: Not on file   Intimate Partner Violence:   . Fear of Current or Ex-Partner: Not on file  . Emotionally Abused: Not on file  . Physically Abused: Not on file  . Sexually Abused: Not on file    Review of Systems: All systems reviewed and negative except where noted in HPI.  Physical Exam: Vital signs in last 24 hours: Temp:  [97.6 F (36.4 C)-98 F (36.7 C)] 98 F (36.7  C) (01/27 0711) Pulse Rate:  [71-138] 71 (01/27 0711) Resp:  [15-31] 18 (01/27 0711) BP: (90-136)/(64-115) 112/70 (01/27 0711) SpO2:  [91 %-100 %] 100 % (01/27 0711)   General:   Alert, thin male in NAD Psych:  Pleasant, cooperative. Normal mood and affect. Eyes:  Pupils equal, sclera clear, no icterus.   Conjunctiva pink. Ears:  Normal auditory acuity. Nose:  No deformity, discharge,  or lesions. Neck:  Supple; no masses Lungs:  Clear throughout to auscultation.   No wheezes, crackles, or rhonchi.  Heart:  Regular rate and rhythm; no murmurs, no lower extremity edema Abdomen:  Soft, non-distended, nontender, BS active, no palp mass   Rectal:  Deferred  Msk:  Symmetrical without gross deformities. . Neurologic:  Alert and  oriented x4;  grossly normal neurologically. Skin:  Intact without significant lesions or rashes.   Intake/Output from previous day: 01/26 0701 - 01/27 0700 In: 2000 [IV Piggyback:2000] Out: -  Intake/Output this shift: No intake/output data recorded.  Lab Results: Recent Labs    01/05/20 1433 01/06/20 0338  WBC 6.2 5.1  HGB 11.6* 9.9*  HCT 35.6* 29.6*  PLT 140* 127*   BMET Recent Labs    01/05/20 1433 01/06/20 0338  NA 143 142  K 4.5 4.3  CL 105 108  CO2 20* 24  GLUCOSE 244* 131*  BUN 26* 21  CREATININE 1.43* 0.90  CALCIUM 8.4* 8.1*   LFT Recent Labs    01/05/20 1530 01/05/20 1530 01/06/20 0338  PROT 6.5   < > 5.6*  ALBUMIN 1.9*   < > 1.8*  AST 83*   < > 65*  ALT 49*   < > 43  ALKPHOS 974*   < > 882*  BILITOT 3.0*   < > 1.5*  BILIDIR 1.5*  --   --   IBILI 1.5*  --    --    < > = values in this interval not displayed.   PT/INR Recent Labs    01/05/20 1433 01/06/20 0338  LABPROT 26.2* 22.6*  INR 2.4* 2.0*   Hepatitis Panel Recent Labs    01/05/20 2030  HEPBSAG NON REACTIVE  HCVAB NON REACTIVE  HEPAIGM NON REACTIVE  HEPBIGM NON REACTIVE     . CBC Latest Ref Rng & Units 01/06/2020 01/05/2020  WBC 4.0 - 10.5 K/uL 5.1 6.2  Hemoglobin 13.0 - 17.0 g/dL 9.9(L) 11.6(L)  Hematocrit 39.0 - 52.0 % 29.6(L) 35.6(L)  Platelets 150 - 400 K/uL 127(L) 140(L)    . CMP Latest Ref Rng & Units 01/06/2020 01/05/2020 04/23/2012  Glucose 70 - 99 mg/dL 131(H) 244(H) 116(H)  BUN 8 - 23 mg/dL 21 26(H) 23  Creatinine 0.61 - 1.24 mg/dL 0.90 1.43(H) 1.00  Sodium 135 - 145 mmol/L 142 143 139  Potassium 3.5 - 5.1 mmol/L 4.3 4.5 4.2  Chloride 98 - 111 mmol/L 108 105 102  CO2 22 - 32 mmol/L 24 20(L) 26  Calcium 8.9 - 10.3 mg/dL 8.1(L) 8.4(L) 9.6  Total Protein 6.5 - 8.1 g/dL 5.6(L) 6.5 -  Total Bilirubin 0.3 - 1.2 mg/dL 1.5(H) 3.0(H) -  Alkaline Phos 38 - 126 U/L 882(H) 974(H) -  AST 15 - 41 U/L 65(H) 83(H) -  ALT 0 - 44 U/L 43 49(H) -   Studies/Results: DG Chest Port 1 View  Result Date: 01/05/2020 CLINICAL DATA:  Weakness EXAM: PORTABLE CHEST 1 VIEW COMPARISON:  None. FINDINGS: The heart size and mediastinal contours are within normal limits. No focal airspace consolidation, pleural  effusion, or pneumothorax. The visualized skeletal structures are unremarkable. IMPRESSION: No active disease. Electronically Signed   By: Davina Poke D.O.   On: 01/05/2020 14:15   US Abdomen Limited RUQ  Result Date: 01/05/2020 CLINICAL DATA:  Abdominal pain for 2 weeks EXAM: ULTRASOUND ABDOMEN LIMITED RIGHT UPPER QUADRANT COMPARISON:  None. Chest x-ray 01/05/2020 FINDINGS: Gallbladder: Slightly distended. Moderate sludge in the gallbladder. No shadowing stones. Normal wall thickness. Negative sonographic Murphy Common bile duct: Diameter: 6 mm Liver: Liver is slightly echogenic.  Multiple cysts within the liver. The largest is seen in the posterior right hepatic lobe and measures 2.3 x 1.8 x 1.6 cm. Portal vein is patent on color Doppler imaging with normal direction of blood flow towards the liver. Other: None. IMPRESSION: 1. Slightly distended gallbladder with sludge, but negative for wall thickness, sonographic Murphy, or other features to suggest acute gallbladder disease. Common duct diameter upper limits of normal 2. Slightly echogenic liver with multiple cysts Electronically Signed   By: Donavan Foil M.D.   On: 01/05/2020 18:07    Principal Problem:   Atrial fibrillation with RVR ([place]) Active Problems:   HTN (hypertension)   HLD (hyperlipidemia)   Renal insufficiency   Coagulopathy ([place])   Liver failure ([place])   Hyperglycemia    Tye Savoy, NP-C @  01/06/2020, 9:48 AM

## 2020-01-07 ENCOUNTER — Encounter (HOSPITAL_COMMUNITY): Payer: Self-pay | Admitting: Family Medicine

## 2020-01-07 DIAGNOSIS — R935 Abnormal findings on diagnostic imaging of other abdominal regions, including retroperitoneum: Secondary | ICD-10-CM

## 2020-01-07 DIAGNOSIS — R748 Abnormal levels of other serum enzymes: Secondary | ICD-10-CM

## 2020-01-07 DIAGNOSIS — R945 Abnormal results of liver function studies: Secondary | ICD-10-CM

## 2020-01-07 DIAGNOSIS — R7989 Other specified abnormal findings of blood chemistry: Secondary | ICD-10-CM

## 2020-01-07 LAB — HEPATIC FUNCTION PANEL
ALT: 42 U/L (ref 0–44)
AST: 53 U/L — ABNORMAL HIGH (ref 15–41)
Albumin: 1.7 g/dL — ABNORMAL LOW (ref 3.5–5.0)
Alkaline Phosphatase: 792 U/L — ABNORMAL HIGH (ref 38–126)
Bilirubin, Direct: 0.7 mg/dL — ABNORMAL HIGH (ref 0.0–0.2)
Indirect Bilirubin: 0.7 mg/dL (ref 0.3–0.9)
Total Bilirubin: 1.4 mg/dL — ABNORMAL HIGH (ref 0.3–1.2)
Total Protein: 6 g/dL — ABNORMAL LOW (ref 6.5–8.1)

## 2020-01-07 LAB — BASIC METABOLIC PANEL
Anion gap: 12 (ref 5–15)
BUN: 20 mg/dL (ref 8–23)
CO2: 18 mmol/L — ABNORMAL LOW (ref 22–32)
Calcium: 7.9 mg/dL — ABNORMAL LOW (ref 8.9–10.3)
Chloride: 117 mmol/L — ABNORMAL HIGH (ref 98–111)
Creatinine, Ser: 1.21 mg/dL (ref 0.61–1.24)
GFR calc Af Amer: >60 mL/min (ref >60)
GFR calc non Af Amer: 59 mL/min — ABNORMAL LOW (ref >60)
Glucose, Bld: 139 mg/dL — ABNORMAL HIGH (ref 70–99)
Potassium: 3.8 mmol/L (ref 3.5–5.1)
Sodium: 147 mmol/L — ABNORMAL HIGH (ref 135–145)

## 2020-01-07 LAB — CBC
HCT: 29.7 % — ABNORMAL LOW (ref 39.0–52.0)
Hemoglobin: 9.6 g/dL — ABNORMAL LOW (ref 13.0–17.0)
MCH: 26.1 pg (ref 26.0–34.0)
MCHC: 32.3 g/dL (ref 30.0–36.0)
MCV: 80.7 fL (ref 80.0–100.0)
Platelets: 123 10*3/uL — ABNORMAL LOW (ref 150–400)
RBC: 3.68 MIL/uL — ABNORMAL LOW (ref 4.22–5.81)
RDW: 15.9 % — ABNORMAL HIGH (ref 11.5–15.5)
WBC: 6 10*3/uL (ref 4.0–10.5)
nRBC: 2.2 % — ABNORMAL HIGH (ref 0.0–0.2)

## 2020-01-07 LAB — IRON AND TIBC
Iron: 115 ug/dL (ref 45–182)
Saturation Ratios: 78 % — ABNORMAL HIGH (ref 17.9–39.5)
TIBC: 147 ug/dL — ABNORMAL LOW (ref 250–450)
UIBC: 32 ug/dL

## 2020-01-07 LAB — PATHOLOGIST SMEAR REVIEW

## 2020-01-07 LAB — GLUCOSE, CAPILLARY
Glucose-Capillary: 125 mg/dL — ABNORMAL HIGH (ref 70–99)
Glucose-Capillary: 108 mg/dL — ABNORMAL HIGH (ref 70–99)
Glucose-Capillary: 188 mg/dL — ABNORMAL HIGH (ref 70–99)
Glucose-Capillary: 84 mg/dL (ref 70–99)

## 2020-01-07 LAB — FERRITIN: Ferritin: >7500 ng/mL — ABNORMAL HIGH (ref 24–336)

## 2020-01-07 LAB — MAGNESIUM: Magnesium: 2.5 mg/dL — ABNORMAL HIGH (ref 1.7–2.4)

## 2020-01-07 LAB — PROTIME-INR
INR: 1.8 — ABNORMAL HIGH (ref 0.8–1.2)
Prothrombin Time: 21.1 seconds — ABNORMAL HIGH (ref 11.4–15.2)

## 2020-01-07 MED ORDER — EPINEPHRINE 0.3 MG/0.3ML IJ SOAJ
0.3000 mg | Freq: Once | INTRAMUSCULAR | Status: DC | PRN
  Filled 2020-01-07 (×2): qty 0.6

## 2020-01-07 MED ORDER — CHLORHEXIDINE GLUCONATE CLOTH 2 % EX PADS
6.0000 | MEDICATED_PAD | Freq: Every day | CUTANEOUS | Status: DC
  Administered 2020-01-07 – 2020-01-14 (×6): 6 via TOPICAL

## 2020-01-07 MED ORDER — VITAMIN K1 10 MG/ML IJ SOLN
10.0000 mg | Freq: Once | INTRAMUSCULAR | Status: AC
  Administered 2020-01-07: 10 mg via SUBCUTANEOUS
  Filled 2020-01-07: qty 1

## 2020-01-07 MED ORDER — METOPROLOL SUCCINATE ER 50 MG PO TB24
50.0000 mg | ORAL_TABLET | Freq: Every day | ORAL | Status: DC
  Administered 2020-01-07: 50 mg via ORAL
  Filled 2020-01-07: qty 1

## 2020-01-07 MED ORDER — AMOXICILLIN 500 MG PO CAPS
500.0000 mg | ORAL_CAPSULE | Freq: Once | ORAL | Status: AC
  Administered 2020-01-07: 500 mg via ORAL
  Filled 2020-01-07: qty 1

## 2020-01-07 MED ORDER — DIPHENHYDRAMINE HCL 50 MG/ML IJ SOLN: 25.0000 mg | Freq: Once | INTRAMUSCULAR | Status: DC | PRN

## 2020-01-07 MED ORDER — TAMSULOSIN HCL 0.4 MG PO CAPS
0.4000 mg | ORAL_CAPSULE | Freq: Every day | ORAL | Status: DC
  Administered 2020-01-07 – 2020-01-13 (×7): 0.4 mg via ORAL
  Filled 2020-01-07 (×7): qty 1

## 2020-01-07 NOTE — Consult Note (Addendum)
[place]  [phone] [phone]   [place]  Referral MD: Dr. Jennye Boroughs  Reason for Referral: Abnormal imaging finding concerning for multiple myeloma  HPI: Mr. Huckaba is a 74 year old male past medical history significant for hypertension.  He presented to the ER after an episode of near syncope.  He developed some acute lightheadedness and generalized weakness upon standing and felt as though he might pass out.  This improved after sitting down.  In the ER, he was slightly tachypneic and tachycardic.  EKG showed atrial fibrillation with a heart rate of 125.  Labs showed an alkaline phosphatase of 974, albumin 1.9, AST 83, ALT 49, total bilirubin 3.0, and creatinine 1.43.  CBC showed a mild decrease in his hemoglobin to 11.6 and mild thrombocytopenia with a platelet count of 140,000.  The patient had an MRI/MRCP on 01/06/2020 which showed marked heterogenicity of the visualized marrow spaces of the spine and pelvis, areas of restricted diffusion and abnormal enhancement on a background of iron deposition.  Findings raise the question of a process such as multiple myeloma which would also explain his elevated alkaline phosphatase, numerous hepatic cysts and signs of hepatic and splenic iron deposition, trace ascites about the liver.  His ferritin earlier today was greater than 7500.  Alkaline phosphatase trending down to 792 today until bilirubin is down to 1.4.  SPEP and UPEP have been ordered and are pending.  The patient denies having any presyncopal episodes since admission.  He denies fevers, chills, night sweats.  Reports anorexia and weight loss of approximately 10 pounds in the past 2 to 3 weeks.  He denies headaches and vision changes.  Denies chest pain, shortness of breath, cough.  He denies abdominal pain, nausea, vomiting, constipation, diarrhea.  He has not noticed any lower extremity edema.  Denies bone pain.  He has  not noticed any bleeding.  The patient is divorced and has 2 sons that live near [place], [place].  Denies history of tobacco use.  Reports that he drinks 3-4 beers per month.  Medical oncology was asked see the patient to make recommendations regarding his abnormal MRI which raised the question of multiple myeloma.    Past Medical History:  Diagnosis Date  . Hypertension   :  History reviewed. No pertinent surgical history.:  Current Facility-Administered Medications  Medication Dose Route Frequency Provider Last Rate Last Admin  . diphenhydrAMINE (BENADRYL) injection 25 mg  25 mg Intravenous Once PRN Jennye Boroughs, MD      . EPINEPHrine (EPI-PEN) injection 0.3 mg  0.3 mg Intramuscular Once PRN Jennye Boroughs, MD      . insulin aspart (novoLOG) injection 0-5 Units  0-5 Units Subcutaneous QHS Opyd, Ilene Qua, MD      . insulin aspart (novoLOG) injection 0-6 Units  0-6 Units Subcutaneous TID WC Opyd, Ilene Qua, MD      . metoprolol succinate (TOPROL-XL) 24 hr tablet 50 mg  50 mg Oral Daily Geralynn Rile, MD   50 mg at 01/07/20 1032  . ondansetron (ZOFRAN) tablet 4 mg  4 mg Oral Q6H PRN Opyd, Ilene Qua, MD       Or  . ondansetron (ZOFRAN) injection 4 mg  4 mg Intravenous Q6H PRN Opyd, Ilene Qua, MD      . sodium chloride flush (NS) 0.9 % injection 3 mL  3 mL Intravenous Q12H Opyd, Ilene Qua, MD   3 mL at 01/07/20 [phone]  Allergies  Allergen Reactions  . Penicillins Itching    Did it involve swelling of the face/tongue/throat, SOB, or low BP? Unk Did it involve sudden or severe rash/hives, skin peeling, or any reaction on the inside of your mouth or nose? No Did you need to seek medical attention at a hospital or doctor's office? No When did it last happen? "It happened a long time ago" If all above answers are "NO", may proceed with cephalosporin use.   :  Family History  Family history unknown: Yes  :  Social History   Socioeconomic History  . Marital  status: Married    Spouse name: Not on file  . Number of children: Not on file  . Years of education: Not on file  . Highest education level: Not on file  Occupational History  . Not on file  Tobacco Use  . Smoking status: Never Smoker  . Smokeless tobacco: Never Used  Substance and Sexual Activity  . Alcohol use: Yes    Alcohol/week: 4.0 standard drinks    Types: 4 Cans of beer per week  . Drug use: Never  . Sexual activity: Not on file  Other Topics Concern  . Not on file  Social History Narrative  . Not on file   Social Determinants of Health   Financial Resource Strain:   . Difficulty of Paying Living Expenses: Not on file  Food Insecurity:   . Worried About Charity fundraiser in the Last Year: Not on file  . Ran Out of Food in the Last Year: Not on file  Transportation Needs:   . Lack of Transportation (Medical): Not on file  . Lack of Transportation (Non-Medical): Not on file  Physical Activity:   . Days of Exercise per Week: Not on file  . Minutes of Exercise per Session: Not on file  Stress:   . Feeling of Stress : Not on file  Social Connections:   . Frequency of Communication with Friends and Family: Not on file  . Frequency of Social Gatherings with Friends and Family: Not on file  . Attends Religious Services: Not on file  . Active Member of Clubs or Organizations: Not on file  . Attends Archivist Meetings: Not on file  . Marital Status: Not on file  Intimate Partner Violence:   . Fear of Current or Ex-Partner: Not on file  . Emotionally Abused: Not on file  . Physically Abused: Not on file  . Sexually Abused: Not on file  :  Review of Systems: A comprehensive 14 point review of systems was negative except as noted in the HPI.  Exam: Patient Vitals for the past 24 hrs:  BP Temp Temp src Pulse Resp SpO2 Weight  01/07/20 1310 132/71 98.1 F (36.7 C) Oral 81 -- 99 % --  01/07/20 1032 137/89 -- -- (!) 111 -- -- --  01/07/20 0500 (!) 112/51  97.6 F (36.4 C) Oral (!) 51 18 100 % 152 lb 12.5 oz (69.3 kg)  01/06/20 2114 (!) 120/98 98.6 F (37 C) Oral 81 18 100 % --  01/06/20 1700 -- 99 F (37.2 C) Oral 70 -- 93 % --    General: Thin male, no distress Eyes:  no scleral icterus.   ENT:  There were no oropharyngeal lesions.   Neck was without thyromegaly.   Lymphatics:  Negative cervical, supraclavicular or axillary adenopathy.   Respiratory: lungs were clear bilaterally without wheezing or crackles.   Cardiovascular:  Regular  rate and rhythm, S1/S2, without murmur, rub or gallop.  There was no pedal edema.   GI:  abdomen was soft, flat, nontender, nondistended, without organomegaly.   Musculoskeletal:  no spinal tenderness of palpation of vertebral spine.   Skin exam was without echymosis, petichae.   Neuro exam was nonfocal. Patient was alert and oriented.  Attention was good.   Language was appropriate.  Mood was normal without depression.  Speech was not pressured.  Thought content was not tangential.     Lab Results  Component Value Date   WBC 6.0 01/07/2020   HGB 9.6 (L) 01/07/2020   HCT 29.7 (L) 01/07/2020   PLT 123 (L) 01/07/2020   GLUCOSE 139 (H) 01/07/2020   LDLDIRECT 111 (H) 04/15/2012   ALT 42 01/07/2020   AST 53 (H) 01/07/2020   NA 147 (H) 01/07/2020   K 3.8 01/07/2020   CL 117 (H) 01/07/2020   CREATININE 1.21 01/07/2020   BUN 20 01/07/2020   CO2 18 (L) 01/07/2020    DG Chest Port 1 View  Result Date: 01/05/2020 CLINICAL DATA:  Weakness EXAM: PORTABLE CHEST 1 VIEW COMPARISON:  None. FINDINGS: The heart size and mediastinal contours are within normal limits. No focal airspace consolidation, pleural effusion, or pneumothorax. The visualized skeletal structures are unremarkable. IMPRESSION: No active disease. Electronically Signed   By: Davina Poke D.O.   On: 01/05/2020 14:15   MR ABDOMEN MRCP W WO CONTAST  Result Date: 01/06/2020 CLINICAL DATA:  Painless jaundice and weight loss. EXAM: MRI ABDOMEN  WITHOUT AND WITH CONTRAST (INCLUDING MRCP) TECHNIQUE: Multiplanar multisequence MR imaging of the abdomen was performed both before and after the administration of intravenous contrast. Heavily T2-weighted images of the biliary and pancreatic ducts were obtained, and three-dimensional MRCP images were rendered by post processing. CONTRAST:  45m GADAVIST GADOBUTROL 1 MMOL/ML IV SOLN COMPARISON:  Ultrasound evaluation 01/05/2020 FINDINGS: Lower chest: Assessment of the lung bases is limited on MR. No signs of consolidation or pleural effusion. Hepatobiliary: Numerous hepatic cysts, signs of hepatic iron deposition. Is. Sludge layering in the dependent gallbladder. No signs of filling defect in the biliary tree. Pancreas: Limited assessment due to respiratory motion. No signs of ductal dilation or focal lesion. Arterial phase imaging is showing reasonable quality. Spleen:  Normal size spleen with signs of splenic iron deposition. Adrenals/Urinary Tract: Normal adrenals. Symmetric enhancement of bilateral kidneys. No signs of hydronephrosis. Stomach/Bowel: Visualized gastrointestinal tract is unremarkable with very limited assessment due to respiratory motion and without protocol for bowel assessment on MR. Vascular/Lymphatic: Patent abdominal vasculature. No signs of adenopathy. Other:  Trace ascites about the liver. Musculoskeletal: Very heterogeneous marrow signal with signs of iron deposition in the marrow spaces. Assessment on in and out of phase is limited. Scattered areas of relative restricted diffusion are noted though diffusion is limited in the setting of iron deposition. Heterogeneous patchy marrow space enhancement is noted throughout. IMPRESSION: 1. Marked heterogeneity of the visualized marrow spaces of the spine and pelvis. Areas of restricted diffusion and abnormal enhancement on a background of iron deposition. Correlate with any history of chronic anemia and exogenous iron administration or  transfusions. Findings raise the question of process such as multiple myeloma, perhaps this would explain the elevated alkaline phosphatase which is more pronounced than other hepatic enzyme elevations. 2. Numerous hepatic cysts and signs of hepatic and splenic iron deposition. 3. Trace ascites about the liver. 4. No signs of biliary ductal dilation or filling defect. 5. Sludge layering in a  mildly distended gallbladder. Electronically Signed   By: Zetta Bills M.D.   On: 01/06/2020 19:56   ECHOCARDIOGRAM COMPLETE  Result Date: 01/06/2020   ECHOCARDIOGRAM REPORT   Patient Name:   CATRELL MORRONE Date of Exam: 01/06/2020 Medical Rec #:  [id number]     Height:       69.8 in Accession #:    [id number]    Weight:       194.5 lb Date of Birth:  02-05-1946    BSA:          2.06 m Patient Age:    83 years      BP:           112/70 mmHg Patient Gender: M             HR:           120 bpm. Exam Location:  Inpatient Procedure: 2D Echo Indications:    Atrial fibrillation  History:        Patient has no prior history of Echocardiogram examinations.                 Risk Factors:Hypertension and Dyslipidemia.  Sonographer:    Clayton Lefort RDCS (AE) Referring Phys: [id number] [place]  1. Left ventricular ejection fraction, by visual estimation, is 55 to 60%. The left ventricle has normal function. There is mildly increased left ventricular hypertrophy.  2. Left ventricular diastolic function could not be evaluated.  3. The left ventricle has no regional wall motion abnormalities.  4. Global right ventricle has normal systolic function.The right ventricular size is normal. No increase in right ventricular wall thickness.  5. Left atrial size was normal.  6. Right atrial size was normal.  7. Mild mitral annular calcification.  8. The mitral valve is grossly normal. Mild mitral valve regurgitation.  9. The tricuspid valve is normal in structure. 10. The tricuspid valve is normal in structure. Tricuspid valve  regurgitation is trivial. 11. The aortic valve is tricuspid. Aortic valve regurgitation is not visualized. 12. The pulmonic valve was not well visualized. Pulmonic valve regurgitation is not visualized. 13. The inferior vena cava is normal in size with greater than 50% respiratory variability, suggesting right atrial pressure of 3 mmHg. FINDINGS  Left Ventricle: Left ventricular ejection fraction, by visual estimation, is 55 to 60%. The left ventricle has normal function. The left ventricle has no regional wall motion abnormalities. There is mildly increased left ventricular hypertrophy. Concentric left ventricular hypertrophy. The left ventricular diastology could not be evaluated due to nondiagnostic images. Left ventricular diastolic function could not be evaluated. Right Ventricle: The right ventricular size is normal. No increase in right ventricular wall thickness. Global RV systolic function is has normal systolic function. Left Atrium: Left atrial size was normal in size. Right Atrium: Right atrial size was normal in size Pericardium: There is no evidence of pericardial effusion. Mitral Valve: The mitral valve is grossly normal. There is mild thickening of the mitral valve leaflet(s). There is mild calcification of the mitral valve leaflet(s). Mild mitral annular calcification. Mild mitral valve regurgitation. Tricuspid Valve: The tricuspid valve is normal in structure. Tricuspid valve regurgitation is trivial. Aortic Valve: The aortic valve is tricuspid. . There is mild thickening and mild calcification of the aortic valve. Aortic valve regurgitation is not visualized. There is mild thickening of the aortic valve. There is mild calcification of the aortic valve. Pulmonic Valve: The pulmonic valve was not well visualized. Pulmonic valve regurgitation is  not visualized. Pulmonic regurgitation is not visualized. Aorta: The aortic root, ascending aorta and aortic arch are all structurally normal, with no  evidence of dilitation or obstruction. Pulmonary Artery: The pulmonary artery is not well seen. Venous: The inferior vena cava is normal in size with greater than 50% respiratory variability, suggesting right atrial pressure of 3 mmHg. IAS/Shunts: No atrial level shunt detected by color flow Doppler.  LEFT VENTRICLE PLAX 2D LVIDd:         4.70 cm LVIDs:         3.30 cm LV PW:         1.25 cm LV IVS:        1.40 cm LVOT diam:     2.00 cm LV SV:         58 ml LV SV Index:   27.70 LVOT Area:     3.14 cm  RIGHT VENTRICLE             IVC RV Basal diam:  3.10 cm     IVC diam: 1.20 cm RV S prime:     15.70 cm/s TAPSE (M-mode): 2.3 cm LEFT ATRIUM           Index       RIGHT ATRIUM           Index LA diam:      3.10 cm 1.51 cm/m  RA Area:     15.80 cm LA Vol (A2C): 42.8 ml 20.80 ml/m RA Volume:   40.20 ml  19.54 ml/m LA Vol (A4C): 53.2 ml 25.85 ml/m  AORTIC VALVE LVOT Vmax:   125.00 cm/s LVOT Vmean:  80.740 cm/s LVOT VTI:    0.227 m  AORTA Ao Root diam: 3.30 cm Ao Asc diam:  3.10 cm  SHUNTS Systemic VTI:  0.23 m Systemic Diam: 2.00 cm  Buford Dresser MD Electronically signed by Buford Dresser MD Signature Date/Time: 01/06/2020/1:14:41 PM    Final    US Abdomen Limited RUQ  Result Date: 01/05/2020 CLINICAL DATA:  Abdominal pain for 2 weeks EXAM: ULTRASOUND ABDOMEN LIMITED RIGHT UPPER QUADRANT COMPARISON:  None. Chest x-ray 01/05/2020 FINDINGS: Gallbladder: Slightly distended. Moderate sludge in the gallbladder. No shadowing stones. Normal wall thickness. Negative sonographic Murphy Common bile duct: Diameter: 6 mm Liver: Liver is slightly echogenic. Multiple cysts within the liver. The largest is seen in the posterior right hepatic lobe and measures 2.3 x 1.8 x 1.6 cm. Portal vein is patent on color Doppler imaging with normal direction of blood flow towards the liver. Other: None. IMPRESSION: 1. Slightly distended gallbladder with sludge, but negative for wall thickness, sonographic Murphy, or other  features to suggest acute gallbladder disease. Common duct diameter upper limits of normal 2. Slightly echogenic liver with multiple cysts Electronically Signed   By: Donavan Foil M.D.   On: 01/05/2020 18:07     DG Chest Port 1 View  Result Date: 01/05/2020 CLINICAL DATA:  Weakness EXAM: PORTABLE CHEST 1 VIEW COMPARISON:  None. FINDINGS: The heart size and mediastinal contours are within normal limits. No focal airspace consolidation, pleural effusion, or pneumothorax. The visualized skeletal structures are unremarkable. IMPRESSION: No active disease. Electronically Signed   By: Davina Poke D.O.   On: 01/05/2020 14:15   MR ABDOMEN MRCP W WO CONTAST  Result Date: 01/06/2020 CLINICAL DATA:  Painless jaundice and weight loss. EXAM: MRI ABDOMEN WITHOUT AND WITH CONTRAST (INCLUDING MRCP) TECHNIQUE: Multiplanar multisequence MR imaging of the abdomen was performed both before and after the  administration of intravenous contrast. Heavily T2-weighted images of the biliary and pancreatic ducts were obtained, and three-dimensional MRCP images were rendered by post processing. CONTRAST:  37m GADAVIST GADOBUTROL 1 MMOL/ML IV SOLN COMPARISON:  Ultrasound evaluation 01/05/2020 FINDINGS: Lower chest: Assessment of the lung bases is limited on MR. No signs of consolidation or pleural effusion. Hepatobiliary: Numerous hepatic cysts, signs of hepatic iron deposition. Is. Sludge layering in the dependent gallbladder. No signs of filling defect in the biliary tree. Pancreas: Limited assessment due to respiratory motion. No signs of ductal dilation or focal lesion. Arterial phase imaging is showing reasonable quality. Spleen:  Normal size spleen with signs of splenic iron deposition. Adrenals/Urinary Tract: Normal adrenals. Symmetric enhancement of bilateral kidneys. No signs of hydronephrosis. Stomach/Bowel: Visualized gastrointestinal tract is unremarkable with very limited assessment due to respiratory motion and  without protocol for bowel assessment on MR. Vascular/Lymphatic: Patent abdominal vasculature. No signs of adenopathy. Other:  Trace ascites about the liver. Musculoskeletal: Very heterogeneous marrow signal with signs of iron deposition in the marrow spaces. Assessment on in and out of phase is limited. Scattered areas of relative restricted diffusion are noted though diffusion is limited in the setting of iron deposition. Heterogeneous patchy marrow space enhancement is noted throughout. IMPRESSION: 1. Marked heterogeneity of the visualized marrow spaces of the spine and pelvis. Areas of restricted diffusion and abnormal enhancement on a background of iron deposition. Correlate with any history of chronic anemia and exogenous iron administration or transfusions. Findings raise the question of process such as multiple myeloma, perhaps this would explain the elevated alkaline phosphatase which is more pronounced than other hepatic enzyme elevations. 2. Numerous hepatic cysts and signs of hepatic and splenic iron deposition. 3. Trace ascites about the liver. 4. No signs of biliary ductal dilation or filling defect. 5. Sludge layering in a mildly distended gallbladder. Electronically Signed   By: GZetta BillsM.D.   On: 01/06/2020 19:56   ECHOCARDIOGRAM COMPLETE  Result Date: 01/06/2020   ECHOCARDIOGRAM REPORT   Patient Name:   LKARTHIKEYA FUNKEDate of Exam: 01/06/2020 Medical Rec #:  [id number]    Height:       69.8 in Accession #:    [id number]   Weight:       194.5 lb Date of Birth:  109/11/1946   BSA:          2.06 m Patient Age:    89years      BP:           112/70 mmHg Patient Gender: M             HR:           120 bpm. Exam Location:  Inpatient Procedure: 2D Echo Indications:    Atrial fibrillation  History:        Patient has no prior history of Echocardiogram examinations.                 Risk Factors:Hypertension and Dyslipidemia.  Sonographer:    AClayton LefortRDCS (AE) Referring Phys: [id number] 1. Left ventricular ejection fraction, by visual estimation, is 55 to 60%. The left ventricle has normal function. There is mildly increased left ventricular hypertrophy.  2. Left ventricular diastolic function could not be evaluated.  3. The left ventricle has no regional wall motion abnormalities.  4. Global right ventricle has normal systolic function.The right ventricular size is normal. No increase in right ventricular wall thickness.  5. Left atrial size was normal.  6. Right atrial size was normal.  7. Mild mitral annular calcification.  8. The mitral valve is grossly normal. Mild mitral valve regurgitation.  9. The tricuspid valve is normal in structure. 10. The tricuspid valve is normal in structure. Tricuspid valve regurgitation is trivial. 11. The aortic valve is tricuspid. Aortic valve regurgitation is not visualized. 12. The pulmonic valve was not well visualized. Pulmonic valve regurgitation is not visualized. 13. The inferior vena cava is normal in size with greater than 50% respiratory variability, suggesting right atrial pressure of 3 mmHg. FINDINGS  Left Ventricle: Left ventricular ejection fraction, by visual estimation, is 55 to 60%. The left ventricle has normal function. The left ventricle has no regional wall motion abnormalities. There is mildly increased left ventricular hypertrophy. Concentric left ventricular hypertrophy. The left ventricular diastology could not be evaluated due to nondiagnostic images. Left ventricular diastolic function could not be evaluated. Right Ventricle: The right ventricular size is normal. No increase in right ventricular wall thickness. Global RV systolic function is has normal systolic function. Left Atrium: Left atrial size was normal in size. Right Atrium: Right atrial size was normal in size Pericardium: There is no evidence of pericardial effusion. Mitral Valve: The mitral valve is grossly normal. There is mild thickening of the mitral  valve leaflet(s). There is mild calcification of the mitral valve leaflet(s). Mild mitral annular calcification. Mild mitral valve regurgitation. Tricuspid Valve: The tricuspid valve is normal in structure. Tricuspid valve regurgitation is trivial. Aortic Valve: The aortic valve is tricuspid. . There is mild thickening and mild calcification of the aortic valve. Aortic valve regurgitation is not visualized. There is mild thickening of the aortic valve. There is mild calcification of the aortic valve. Pulmonic Valve: The pulmonic valve was not well visualized. Pulmonic valve regurgitation is not visualized. Pulmonic regurgitation is not visualized. Aorta: The aortic root, ascending aorta and aortic arch are all structurally normal, with no evidence of dilitation or obstruction. Pulmonary Artery: The pulmonary artery is not well seen. Venous: The inferior vena cava is normal in size with greater than 50% respiratory variability, suggesting right atrial pressure of 3 mmHg. IAS/Shunts: No atrial level shunt detected by color flow Doppler.  LEFT VENTRICLE PLAX 2D LVIDd:         4.70 cm LVIDs:         3.30 cm LV PW:         1.25 cm LV IVS:        1.40 cm LVOT diam:     2.00 cm LV SV:         58 ml LV SV Index:   27.70 LVOT Area:     3.14 cm  RIGHT VENTRICLE             IVC RV Basal diam:  3.10 cm     IVC diam: 1.20 cm RV S prime:     15.70 cm/s TAPSE (M-mode): 2.3 cm LEFT ATRIUM           Index       RIGHT ATRIUM           Index LA diam:      3.10 cm 1.51 cm/m  RA Area:     15.80 cm LA Vol (A2C): 42.8 ml 20.80 ml/m RA Volume:   40.20 ml  19.54 ml/m LA Vol (A4C): 53.2 ml 25.85 ml/m  AORTIC VALVE LVOT Vmax:   125.00 cm/s LVOT Vmean:  80.740 cm/s LVOT  VTI:    0.227 m  AORTA Ao Root diam: 3.30 cm Ao Asc diam:  3.10 cm  SHUNTS Systemic VTI:  0.23 m Systemic Diam: 2.00 cm  Buford Dresser MD Electronically signed by Buford Dresser MD Signature Date/Time: 01/06/2020/1:14:41 PM    Final    US Abdomen Limited  RUQ  Result Date: 01/05/2020 CLINICAL DATA:  Abdominal pain for 2 weeks EXAM: ULTRASOUND ABDOMEN LIMITED RIGHT UPPER QUADRANT COMPARISON:  None. Chest x-ray 01/05/2020 FINDINGS: Gallbladder: Slightly distended. Moderate sludge in the gallbladder. No shadowing stones. Normal wall thickness. Negative sonographic Murphy Common bile duct: Diameter: 6 mm Liver: Liver is slightly echogenic. Multiple cysts within the liver. The largest is seen in the posterior right hepatic lobe and measures 2.3 x 1.8 x 1.6 cm. Portal vein is patent on color Doppler imaging with normal direction of blood flow towards the liver. Other: None. IMPRESSION: 1. Slightly distended gallbladder with sludge, but negative for wall thickness, sonographic Murphy, or other features to suggest acute gallbladder disease. Common duct diameter upper limits of normal 2. Slightly echogenic liver with multiple cysts Electronically Signed   By: Donavan Foil M.D.   On: 01/05/2020 18:07   Assessment and Plan:  1.  Abnormal imaging raising concern for multiple myeloma 2.  Elevated alkaline phosphatase 3.  Mild normocytic anemia and mild thrombocytopenia 4.  Elevated ferritin 5.  A. fib with RVR  -Discussed abnormal findings on the MRI with the patient and need for further work-up.  Will obtain SPEP and UPEP to further evaluate for multiple myeloma.  Pending these test results, he may need additional work-up including a bone marrow biopsy. -The patient has an elevated alkaline phosphatase which is trending downward.  This could be associated with underlying malignancy.  Work-up in progress as noted above. -The patient has anemia and thrombocytopenia which is mild.  Recommend continued monitoring. -Noted significantly elevated ferritin.  Distribution of iron deposition makes primary hemochromatosis unlikely.  Will defer to Dr. Burr Medico for further work-up. -Rate controlled with metoprolol.  Cardiology is following.  Thank you for this referral.    Mikey Bussing, DNP, AGPCNP-BC, AOCNP  Addendum  I have seen the patient, examined him. I agree with the assessment and and plan and have edited the notes.   I have discussed his lab and image findings with pt. His MRI supports iron deposit in liver, spleen and bone marrow, and his ferritin is extremely high.  Although he has no family history of hemochromatosis, or liver disease, heritable hemochromatosis should be ruled out.  I will order HFE gene mutation test.   Pt tells me that he had hepatitis when he was in college, but his hepatitis panel was negative. He is not a heavy alcohol drinker. The abnormal liver function could be related to hemochromatosis.  The extremely high level of ferritin also make me wondering if he has Hemophagocytic lymphohistiocytosis ([place]), however he does not have fever, significant transaminitis, splenomegaly, his cytopenia is very mild, I think this is unlikely.   MRI showed diffuse abnormal bone marrow signal in spine and pelvic, this is not specific for MM, his SPEP and UPEP are pending, I will also order light chain level. His total serum protein is low, no significant renal failure, hypercalcemia, or anemia makes MM less likely. Will hold on bone marrow biopsy for now while we are waiting for the above lab result.   Thanks for this interesting consult, we will f/u.   Truitt Merle  01/07/2020

## 2020-01-07 NOTE — Progress Notes (Signed)
Penicillin Allergy Assessment    Penicillin Allergy History: I spoke to Frank Daugherty this afternoon about his penicillin allergy. It is listed in the chart that his reaction was itching. He confirms this is indeed what happened after he took penicillin, along with possibly a mild rash he is not too sure. This happened about 60 years ago when he was a teenager. He did not experience any life threatening adverse events and to his knowledge he has not tried any penicillin like antibiotics since the reaction.   I explained to [place] that patients who experience a Type 1 allergic reaction like he did are likely to lose their allergy later in life. About 80% of people lose their type 1 allergy after just 10 years. Given his reaction occurred 60 years ago there is an exceedingly low chance he is still penicillin allergic. I also told him about the benefits of removing the penicillin allergy label as well as the harms that come along with penicillin allergy.   He is agreeable to do an amoxicillin challenge. I spoke with the patients nurse who will monitor him every 15 minutes for the hour after and document any adverse reactions. If he tolerates with no issues I will remove his penicillin allergy and contact his PCP and pharmacy so they can do the same.  Plan:  -Amoxicillin 500 mg x1 dose -Vitals q15 mins for the hour after -If tolerates remove penicillin allergy   Nicoletta Dress, PharmD PGY2 Infectious Disease Pharmacy Resident

## 2020-01-07 NOTE — Progress Notes (Addendum)
Progress Note    Frank Daugherty  XTK:[id number] DOB: Jun 27, 1946  DOA: 01/05/2020 PCP: Patient, No Pcp Per      Brief Narrative:    Medical records reviewed and are as summarized below:  Frank Daugherty is an 74 y.o. male with medical history significant for hypertension, has not seen a physician in several years, and now presents the ED after an episode of near syncope.  Patient said he was in his usual state of health until he developed acute lightheadedness and generalized weakness upon standing, feeling as though he might pass out, and improved some after sitting down.  ED Course: Upon arrival to the ED, patient is found to be afebrile, saturating low 90s on room air, slightly tachypneic, tachycardic to the 130s, and with blood pressure 90/64.  EKG features atrial fibrillation with rate 125.  Chest x-ray is negative for acute cardiopulmonary disease.  Right upper quadrant ultrasound with slightly distended gallbladder with sludge but no wall thickening or Murphy sign to suggest acute gallbladder disease.  CBD dimensions are upper limit of normal on the ultrasound.  Chemistry panel concerning for alkaline phosphatase 974, albumin 1.9, AST 83, ALT 49, total bilirubin 3.0, and creatinine 1.43, all normal in 2013.  COVID-19 PCR is negative.  CBC with mild anemia and thrombocytopenia.  INR is elevated to 2.4.  Cardiology and gastroenterology were consulted by the ED physician, 1 L normal saline was administered, and hospitalists asked to admit.        Assessment/Plan:   Principal Problem:   Atrial fibrillation with RVR (HCC) Active Problems:   HTN (hypertension)   HLD (hyperlipidemia)   Renal insufficiency   Coagulopathy (HCC)   Liver failure (HCC)   Hyperglycemia   Abnormal liver function tests   Abnormal serum level of alkaline phosphatase   Abnormal MRI of abdomen   Atrial fibrillation with RVR   He converted to NSR and has had intermittent sinus tachycardia. 2D echo  showed EF estimated at 55 to 60%, moderately increased LVH.  Diastolic function could not be evaluated on echo. Continue metoprolol.  No anticoagulation for now since he is coagulopathic.  Follow-up with cardiologist.  Liver disease; coagulopathy, hypoalbuminemia, elevated liver enzymes  - No abdominal pain or obstructive process on RUQ [place]  Hepatitis A, B and C were nonreactive.  MRCP abdomen showed numerous hepatic cysts and signs of hepatic and splenic iron deposition. Ferritin level is very high >7,500. GI recommends vitamin K for coagulopathy. Follow-up with gastroenterologist.  Abnormal heterogeneity of marrow spaces of the spine and pelvis suspicious for multiple myeloma Consulted oncologist, Dr. Burr Medico. UPEP and SPEP have been ordered.  Acute kidney injury: Improved Creatinine has improved with IV fluids.  Urine studies suggest dehydration. Discontinue IV fluids.  Hypertension  - Continue metoprolol  Type 2 diabetes mellitus with hyperglycemia  Hemoglobin A1c was 6.9. NovoLog as needed for hyperglycemia.  Acute urinary retention In and out catheterization was done overnight for [facility] retention with bladder vol of 800cc. Retained urine again this afternoon- bladder vol 558. Start flomax. Place foley catheter for retention     Family Communication/Anticipated D/C date and plan/Code Status   DVT prophylaxis: SCDs Code Status: Full code Family Communication: Plan discussed with patient while son was on speaker phone Disposition Plan: Possible discharge to home in 2 to 3 days      Subjective:   C/o difficulty passing urine. No abdominal pain, SOB or chest pain  Objective:    Vitals:  01/07/20 1032 01/07/20 1310 01/07/20 1555 01/07/20 1611  BP: 137/89 132/71 138/84 127/82  Pulse: (!) 111 81    Resp:      Temp:  98.1 F (36.7 C)    TempSrc:  Oral    SpO2:  99%    Weight:        Intake/Output Summary (Last 24 hours) at 01/07/2020 1626 Last data filed at  01/07/2020 1310 Gross per 24 hour  Intake 480 ml  Output 2400 ml  Net -1920 ml   Filed Weights   01/07/20 0500  Weight: 69.3 kg    Exam:  GEN: NAD SKIN: No rash EYES: EOMI ENT: MMM CV: regular rhythm but slightly tachycardic PULM: CTA B ABD: soft, ND, NT, +BS CNS: AAO x 3, non focal EXT: No edema or tenderness   Data Reviewed:   I have personally reviewed following labs and imaging studies:  Labs: Labs show the following:   Basic Metabolic Panel: Recent Labs  Lab 01/05/20 1433 01/05/20 1433 01/06/20 0338 01/07/20 0953 01/07/20 1002  NA 143  --  142 147*  --   K 4.5   < > 4.3 3.8  --   CL 105  --  108 117*  --   CO2 20*  --  24 18*  --   GLUCOSE 244*  --  131* 139*  --   BUN 26*  --  21 20  --   CREATININE 1.43*  --  0.90 1.21  --   CALCIUM 8.4*  --  8.1* 7.9*  --   MG 2.6*  --   --   --  2.5*   < > = values in this interval not displayed.   GFR CrCl cannot be calculated (Unknown ideal weight.). Liver Function Tests: Recent Labs  Lab 01/05/20 1530 01/06/20 0338 01/07/20 0430  AST 83* 65* 53*  ALT 49* 43 42  ALKPHOS 974* 882* 792*  BILITOT 3.0* 1.5* 1.4*  PROT 6.5 5.6* 6.0*  ALBUMIN 1.9* 1.8* 1.7*   No results for input(s): LIPASE, AMYLASE in the last 168 hours. No results for input(s): AMMONIA in the last 168 hours. Coagulation profile Recent Labs  Lab 01/05/20 1433 01/06/20 0338 01/07/20 0953  INR 2.4* 2.0* 1.8*    CBC: Recent Labs  Lab 01/05/20 1433 01/06/20 0338 01/07/20 1002  WBC 6.2 5.1 6.0  NEUTROABS  --  2.2  --   HGB 11.6* 9.9* 9.6*  HCT 35.6* 29.6* 29.7*  MCV 81.1 79.4* 80.7  PLT 140* 127* 123*   Cardiac Enzymes: No results for input(s): CKTOTAL, CKMB, CKMBINDEX, TROPONINI in the last 168 hours. BNP (last 3 results) No results for input(s): PROBNP in the last 8760 hours. CBG: Recent Labs  Lab 01/06/20 1211 01/06/20 1702 01/06/20 2113 01/07/20 0758 01/07/20 1127  GLUCAP 135* 111* 119* 125* 108*   D-Dimer: No  results for input(s): DDIMER in the last 72 hours. Hgb A1c: Recent Labs    01/06/20 0338  HGBA1C 6.9*   Lipid Profile: No results for input(s): CHOL, HDL, LDLCALC, TRIG, CHOLHDL, LDLDIRECT in the last 72 hours. Thyroid function studies: Recent Labs    01/05/20 1433  TSH 2.281   Anemia work up: Recent Labs    01/07/20 1002  FERRITIN >7,500*  TIBC 147*  IRON 115   Sepsis Labs: Recent Labs  Lab 01/05/20 1433 01/06/20 0338 01/07/20 1002  WBC 6.2 5.1 6.0    Microbiology Recent Results (from the past 240 hour(s))  Respiratory Panel by RT  PCR (Flu A&B, Covid) - Nasopharyngeal Swab     Status: None   Collection Time: 01/05/20  2:43 PM   Specimen: Nasopharyngeal Swab  Result Value Ref Range Status   SARS Coronavirus 2 by RT PCR NEGATIVE NEGATIVE Final    Comment: (NOTE) SARS-CoV-2 target nucleic acids are NOT DETECTED. The SARS-CoV-2 RNA is generally detectable in upper respiratoy specimens during the acute phase of infection. The lowest concentration of SARS-CoV-2 viral copies this assay can detect is 131 copies/mL. A negative result does not preclude SARS-Cov-2 infection and should not be used as the sole basis for treatment or other patient management decisions. A negative result may occur with  improper specimen collection/handling, submission of specimen other than nasopharyngeal swab, presence of viral mutation(s) within the areas targeted by this assay, and inadequate number of viral copies (<131 copies/mL). A negative result must be combined with clinical observations, patient history, and epidemiological information. The expected result is Negative. Fact Sheet for Patients:  PinkCheek.be Fact Sheet for Healthcare Providers:  GravelBags.it This test is not yet ap proved or cleared by the [place] FDA and  has been authorized for detection and/or diagnosis of SARS-CoV-2 by FDA under an Emergency Use  Authorization (EUA). This EUA will remain  in effect (meaning this test can be used) for the duration of the COVID-19 declaration under Section 564(b)(1) of the Act, 21 U.S.C. section 360bbb-3(b)(1), unless the authorization is terminated or revoked sooner.    Influenza A by PCR NEGATIVE NEGATIVE Final   Influenza B by PCR NEGATIVE NEGATIVE Final    Comment: (NOTE) The Xpert Xpress SARS-CoV-2/FLU/RSV assay is intended as an aid in  the diagnosis of influenza from Nasopharyngeal swab specimens and  should not be used as a sole basis for treatment. Nasal washings and  aspirates are unacceptable for Xpert Xpress SARS-CoV-2/FLU/RSV  testing. Fact Sheet for Patients: PinkCheek.be Fact Sheet for Healthcare Providers: GravelBags.it This test is not yet approved or cleared by the [place] FDA and  has been authorized for detection and/or diagnosis of SARS-CoV-2 by  FDA under an Emergency Use Authorization (EUA). This EUA will remain  in effect (meaning this test can be used) for the duration of the  Covid-19 declaration under Section 564(b)(1) of the Act, 21  U.S.C. section 360bbb-3(b)(1), unless the authorization is  terminated or revoked. Performed at [facility], [place] [address], [place], [place] [address]     Procedures and diagnostic studies:  MR ABDOMEN MRCP W WO CONTAST  Result Date: 01/06/2020 CLINICAL DATA:  Painless jaundice and weight loss. EXAM: MRI ABDOMEN WITHOUT AND WITH CONTRAST (INCLUDING MRCP) TECHNIQUE: Multiplanar multisequence MR imaging of the abdomen was performed both before and after the administration of intravenous contrast. Heavily T2-weighted images of the biliary and pancreatic ducts were obtained, and three-dimensional MRCP images were rendered by post processing. CONTRAST:  10m GADAVIST GADOBUTROL 1 MMOL/ML IV SOLN COMPARISON:  Ultrasound evaluation 01/05/2020 FINDINGS: Lower chest:  Assessment of the lung bases is limited on MR. No signs of consolidation or pleural effusion. Hepatobiliary: Numerous hepatic cysts, signs of hepatic iron deposition. Is. Sludge layering in the dependent gallbladder. No signs of filling defect in the biliary tree. Pancreas: Limited assessment due to respiratory motion. No signs of ductal dilation or focal lesion. Arterial phase imaging is showing reasonable quality. Spleen:  Normal size spleen with signs of splenic iron deposition. Adrenals/Urinary Tract: Normal adrenals. Symmetric enhancement of bilateral kidneys. No signs of hydronephrosis. Stomach/Bowel: Visualized gastrointestinal tract is  unremarkable with very limited assessment due to respiratory motion and without protocol for bowel assessment on MR. Vascular/Lymphatic: Patent abdominal vasculature. No signs of adenopathy. Other:  Trace ascites about the liver. Musculoskeletal: Very heterogeneous marrow signal with signs of iron deposition in the marrow spaces. Assessment on in and out of phase is limited. Scattered areas of relative restricted diffusion are noted though diffusion is limited in the setting of iron deposition. Heterogeneous patchy marrow space enhancement is noted throughout. IMPRESSION: 1. Marked heterogeneity of the visualized marrow spaces of the spine and pelvis. Areas of restricted diffusion and abnormal enhancement on a background of iron deposition. Correlate with any history of chronic anemia and exogenous iron administration or transfusions. Findings raise the question of process such as multiple myeloma, perhaps this would explain the elevated alkaline phosphatase which is more pronounced than other hepatic enzyme elevations. 2. Numerous hepatic cysts and signs of hepatic and splenic iron deposition. 3. Trace ascites about the liver. 4. No signs of biliary ductal dilation or filling defect. 5. Sludge layering in a mildly distended gallbladder. Electronically Signed   By: Zetta Bills M.D.   On: 01/06/2020 19:56   ECHOCARDIOGRAM COMPLETE  Result Date: 01/06/2020   ECHOCARDIOGRAM REPORT   Patient Name:   SILVIO SAUSEDO Date of Exam: 01/06/2020 Medical Rec #:  [id number]     Height:       69.8 in Accession #:    [id number]    Weight:       194.5 lb Date of Birth:  1946-01-27    BSA:          2.06 m Patient Age:    31 years      BP:           112/70 mmHg Patient Gender: M             HR:           120 bpm. Exam Location:  Inpatient Procedure: 2D Echo Indications:    Atrial fibrillation  History:        Patient has no prior history of Echocardiogram examinations.                 Risk Factors:Hypertension and Dyslipidemia.  Sonographer:    Clayton Lefort RDCS (AE) Referring Phys: [id number] [place]  1. Left ventricular ejection fraction, by visual estimation, is 55 to 60%. The left ventricle has normal function. There is mildly increased left ventricular hypertrophy.  2. Left ventricular diastolic function could not be evaluated.  3. The left ventricle has no regional wall motion abnormalities.  4. Global right ventricle has normal systolic function.The right ventricular size is normal. No increase in right ventricular wall thickness.  5. Left atrial size was normal.  6. Right atrial size was normal.  7. Mild mitral annular calcification.  8. The mitral valve is grossly normal. Mild mitral valve regurgitation.  9. The tricuspid valve is normal in structure. 10. The tricuspid valve is normal in structure. Tricuspid valve regurgitation is trivial. 11. The aortic valve is tricuspid. Aortic valve regurgitation is not visualized. 12. The pulmonic valve was not well visualized. Pulmonic valve regurgitation is not visualized. 13. The inferior vena cava is normal in size with greater than 50% respiratory variability, suggesting right atrial pressure of 3 mmHg. FINDINGS  Left Ventricle: Left ventricular ejection fraction, by visual estimation, is 55 to 60%. The left ventricle has normal  function. The left ventricle has no regional wall motion abnormalities. There  is mildly increased left ventricular hypertrophy. Concentric left ventricular hypertrophy. The left ventricular diastology could not be evaluated due to nondiagnostic images. Left ventricular diastolic function could not be evaluated. Right Ventricle: The right ventricular size is normal. No increase in right ventricular wall thickness. Global RV systolic function is has normal systolic function. Left Atrium: Left atrial size was normal in size. Right Atrium: Right atrial size was normal in size Pericardium: There is no evidence of pericardial effusion. Mitral Valve: The mitral valve is grossly normal. There is mild thickening of the mitral valve leaflet(s). There is mild calcification of the mitral valve leaflet(s). Mild mitral annular calcification. Mild mitral valve regurgitation. Tricuspid Valve: The tricuspid valve is normal in structure. Tricuspid valve regurgitation is trivial. Aortic Valve: The aortic valve is tricuspid. . There is mild thickening and mild calcification of the aortic valve. Aortic valve regurgitation is not visualized. There is mild thickening of the aortic valve. There is mild calcification of the aortic valve. Pulmonic Valve: The pulmonic valve was not well visualized. Pulmonic valve regurgitation is not visualized. Pulmonic regurgitation is not visualized. Aorta: The aortic root, ascending aorta and aortic arch are all structurally normal, with no evidence of dilitation or obstruction. Pulmonary Artery: The pulmonary artery is not well seen. Venous: The inferior vena cava is normal in size with greater than 50% respiratory variability, suggesting right atrial pressure of 3 mmHg. IAS/Shunts: No atrial level shunt detected by color flow Doppler.  LEFT VENTRICLE PLAX 2D LVIDd:         4.70 cm LVIDs:         3.30 cm LV PW:         1.25 cm LV IVS:        1.40 cm LVOT diam:     2.00 cm LV SV:         58 ml LV SV  Index:   27.70 LVOT Area:     3.14 cm  RIGHT VENTRICLE             IVC RV Basal diam:  3.10 cm     IVC diam: 1.20 cm RV S prime:     15.70 cm/s TAPSE (M-mode): 2.3 cm LEFT ATRIUM           Index       RIGHT ATRIUM           Index LA diam:      3.10 cm 1.51 cm/m  RA Area:     15.80 cm LA Vol (A2C): 42.8 ml 20.80 ml/m RA Volume:   40.20 ml  19.54 ml/m LA Vol (A4C): 53.2 ml 25.85 ml/m  AORTIC VALVE LVOT Vmax:   125.00 cm/s LVOT Vmean:  80.740 cm/s LVOT VTI:    0.227 m  AORTA Ao Root diam: 3.30 cm Ao Asc diam:  3.10 cm  SHUNTS Systemic VTI:  0.23 m Systemic Diam: 2.00 cm  Buford Dresser MD Electronically signed by Buford Dresser MD Signature Date/Time: 01/06/2020/1:14:41 PM    Final    US Abdomen Limited RUQ  Result Date: 01/05/2020 CLINICAL DATA:  Abdominal pain for 2 weeks EXAM: ULTRASOUND ABDOMEN LIMITED RIGHT UPPER QUADRANT COMPARISON:  None. Chest x-ray 01/05/2020 FINDINGS: Gallbladder: Slightly distended. Moderate sludge in the gallbladder. No shadowing stones. Normal wall thickness. Negative sonographic Murphy Common bile duct: Diameter: 6 mm Liver: Liver is slightly echogenic. Multiple cysts within the liver. The largest is seen in the posterior right hepatic lobe and measures 2.3 x 1.8 x 1.6 cm.  Portal vein is patent on color Doppler imaging with normal direction of blood flow towards the liver. Other: None. IMPRESSION: 1. Slightly distended gallbladder with sludge, but negative for wall thickness, sonographic Murphy, or other features to suggest acute gallbladder disease. Common duct diameter upper limits of normal 2. Slightly echogenic liver with multiple cysts Electronically Signed   By: Donavan Foil M.D.   On: 01/05/2020 18:07    Medications:   . insulin aspart  0-5 Units Subcutaneous QHS  . insulin aspart  0-6 Units Subcutaneous TID WC  . metoprolol succinate  50 mg Oral Daily  . sodium chloride flush  3 mL Intravenous Q12H   Continuous Infusions:   LOS: 2 days    Tramel Westbrook  [facility]     01/07/2020, 4:26 PM

## 2020-01-07 NOTE — Progress Notes (Addendum)
Progress Note    ASSESSMENT AND PLAN:    1. Abnormal liver tests, predominantly cholestatic pattern. GGT is elevated but not as much as to be expected if this were all liver related. No evidence for choledocholithiasis on advanced imaging. Imaging does raise concern for multiple myeloma which can explain markedly elevated alk phos.  --Evidence for iron overload by imaging.Discussed with Dr. Tarri Glenn and  distribution of iron deposition (not just liver but also bone marrow and spleen makes primary hemochromatosis unlikely. Also, it would be unusual in someone from [place] --Chronic liver disease not excluded but NO evidence for cirrhosis on imaging. Some if not all of the coagulopathy could be nutritional. Will give Vitamin K to see how much INR will improve.  --Regarding thrombocytopenia, if has multiple myeloma that can apparently cause thrombocytopenia.  --he can eat from GI standpoint. Will place diet order --Son on phone, gave him a brief GI update   2. New Afib with RVR      SUBJECTIVE    No complaints except wants something to drink  / eat   OBJECTIVE:     Vital signs in last 24 hours: Temp:  [97.6 F (36.4 C)-99 F (37.2 C)] 97.6 F (36.4 C) (01/28 0500) Pulse Rate:  [51-81] 51 (01/28 0500) Resp:  [18] 18 (01/28 0500) BP: (112-120)/(51-98) 112/51 (01/28 0500) SpO2:  [93 %-100 %] 100 % (01/28 0500) Weight:  [69.3 kg] 69.3 kg (01/28 0500)   General:   Alert, thin male in NAD EENT:  Normal hearing, non icteric sclera   Heart:  Regular rate and rhythm;  No lower extremity edema   Pulm: Normal respiratory effort   Abdomen:  Soft, nondistended, nontender.  Normal bowel sounds.          Neurologic:  Alert and  oriented x4;  grossly normal neurologically. Psych:  Pleasant, cooperative.  Normal mood and affect.   Intake/Output from previous day: 01/27 0701 - 01/28 0700 In: 0  Out: 1625 [Urine:1625] Intake/Output this shift: No intake/output data  recorded.  Lab Results: Recent Labs    01/05/20 1433 01/06/20 0338  WBC 6.2 5.1  HGB 11.6* 9.9*  HCT 35.6* 29.6*  PLT 140* 127*   BMET Recent Labs    01/05/20 1433 01/06/20 0338  NA 143 142  K 4.5 4.3  CL 105 108  CO2 20* 24  GLUCOSE 244* 131*  BUN 26* 21  CREATININE 1.43* 0.90  CALCIUM 8.4* 8.1*   LFT Recent Labs    01/07/20 0430  PROT 6.0*  ALBUMIN 1.7*  AST 53*  ALT 42  ALKPHOS 792*  BILITOT 1.4*  BILIDIR 0.7*  IBILI 0.7   PT/INR Recent Labs    01/05/20 1433 01/06/20 0338  LABPROT 26.2* 22.6*  INR 2.4* 2.0*   Hepatitis Panel Recent Labs    01/05/20 2030  HEPBSAG NON REACTIVE  HCVAB NON REACTIVE  HEPAIGM NON REACTIVE  HEPBIGM NON REACTIVE    DG Chest Port 1 View  Result Date: 01/05/2020 CLINICAL DATA:  Weakness EXAM: PORTABLE CHEST 1 VIEW COMPARISON:  None. FINDINGS: The heart size and mediastinal contours are within normal limits. No focal airspace consolidation, pleural effusion, or pneumothorax. The visualized skeletal structures are unremarkable. IMPRESSION: No active disease. Electronically Signed   By: Davina Poke D.O.   On: 01/05/2020 14:15   MR ABDOMEN MRCP W WO CONTAST  Result Date: 01/06/2020 CLINICAL DATA:  Painless jaundice and weight loss. EXAM: MRI ABDOMEN WITHOUT AND WITH CONTRAST (INCLUDING  MRCP) TECHNIQUE: Multiplanar multisequence MR imaging of the abdomen was performed both before and after the administration of intravenous contrast. Heavily T2-weighted images of the biliary and pancreatic ducts were obtained, and three-dimensional MRCP images were rendered by post processing. CONTRAST:  29m GADAVIST GADOBUTROL 1 MMOL/ML IV SOLN COMPARISON:  Ultrasound evaluation 01/05/2020 FINDINGS: Lower chest: Assessment of the lung bases is limited on MR. No signs of consolidation or pleural effusion. Hepatobiliary: Numerous hepatic cysts, signs of hepatic iron deposition. Is. Sludge layering in the dependent gallbladder. No signs of  filling defect in the biliary tree. Pancreas: Limited assessment due to respiratory motion. No signs of ductal dilation or focal lesion. Arterial phase imaging is showing reasonable quality. Spleen:  Normal size spleen with signs of splenic iron deposition. Adrenals/Urinary Tract: Normal adrenals. Symmetric enhancement of bilateral kidneys. No signs of hydronephrosis. Stomach/Bowel: Visualized gastrointestinal tract is unremarkable with very limited assessment due to respiratory motion and without protocol for bowel assessment on MR. Vascular/Lymphatic: Patent abdominal vasculature. No signs of adenopathy. Other:  Trace ascites about the liver. Musculoskeletal: Very heterogeneous marrow signal with signs of iron deposition in the marrow spaces. Assessment on in and out of phase is limited. Scattered areas of relative restricted diffusion are noted though diffusion is limited in the setting of iron deposition. Heterogeneous patchy marrow space enhancement is noted throughout. IMPRESSION: 1. Marked heterogeneity of the visualized marrow spaces of the spine and pelvis. Areas of restricted diffusion and abnormal enhancement on a background of iron deposition. Correlate with any history of chronic anemia and exogenous iron administration or transfusions. Findings raise the question of process such as multiple myeloma, perhaps this would explain the elevated alkaline phosphatase which is more pronounced than other hepatic enzyme elevations. 2. Numerous hepatic cysts and signs of hepatic and splenic iron deposition. 3. Trace ascites about the liver. 4. No signs of biliary ductal dilation or filling defect. 5. Sludge layering in a mildly distended gallbladder. Electronically Signed   By: GZetta BillsM.D.   On: 01/06/2020 19:56   ECHOCARDIOGRAM COMPLETE  Result Date: 01/06/2020   ECHOCARDIOGRAM REPORT   Patient Name:   Frank Daugherty of Exam: 01/06/2020 Medical Rec #:  [id number]    Height:       69.8 in Accession  #:    [id number]   Weight:       194.5 lb Date of Birth:  110/11/1946   BSA:          2.06 m Patient Age:    89years      BP:           112/70 mmHg Patient Gender: M             HR:           120 bpm. Exam Location:  Inpatient Procedure: 2D Echo Indications:    Atrial fibrillation  History:        Patient has no prior history of Echocardiogram examinations.                 Risk Factors:Hypertension and Dyslipidemia.  Sonographer:    AClayton LefortRDCS (AE) Referring Phys: [id number] 1. Left ventricular ejection fraction, by visual estimation, is 55 to 60%. The left ventricle has normal function. There is mildly increased left ventricular hypertrophy.  2. Left ventricular diastolic function could not be evaluated.  3. The left ventricle has no regional wall motion abnormalities.  4. Global right ventricle  has normal systolic function.The right ventricular size is normal. No increase in right ventricular wall thickness.  5. Left atrial size was normal.  6. Right atrial size was normal.  7. Mild mitral annular calcification.  8. The mitral valve is grossly normal. Mild mitral valve regurgitation.  9. The tricuspid valve is normal in structure. 10. The tricuspid valve is normal in structure. Tricuspid valve regurgitation is trivial. 11. The aortic valve is tricuspid. Aortic valve regurgitation is not visualized. 12. The pulmonic valve was not well visualized. Pulmonic valve regurgitation is not visualized. 13. The inferior vena cava is normal in size with greater than 50% respiratory variability, suggesting right atrial pressure of 3 mmHg. FINDINGS  Left Ventricle: Left ventricular ejection fraction, by visual estimation, is 55 to 60%. The left ventricle has normal function. The left ventricle has no regional wall motion abnormalities. There is mildly increased left ventricular hypertrophy. Concentric left ventricular hypertrophy. The left ventricular diastology could not be evaluated due to  nondiagnostic images. Left ventricular diastolic function could not be evaluated. Right Ventricle: The right ventricular size is normal. No increase in right ventricular wall thickness. Global RV systolic function is has normal systolic function. Left Atrium: Left atrial size was normal in size. Right Atrium: Right atrial size was normal in size Pericardium: There is no evidence of pericardial effusion. Mitral Valve: The mitral valve is grossly normal. There is mild thickening of the mitral valve leaflet(s). There is mild calcification of the mitral valve leaflet(s). Mild mitral annular calcification. Mild mitral valve regurgitation. Tricuspid Valve: The tricuspid valve is normal in structure. Tricuspid valve regurgitation is trivial. Aortic Valve: The aortic valve is tricuspid. . There is mild thickening and mild calcification of the aortic valve. Aortic valve regurgitation is not visualized. There is mild thickening of the aortic valve. There is mild calcification of the aortic valve. Pulmonic Valve: The pulmonic valve was not well visualized. Pulmonic valve regurgitation is not visualized. Pulmonic regurgitation is not visualized. Aorta: The aortic root, ascending aorta and aortic arch are all structurally normal, with no evidence of dilitation or obstruction. Pulmonary Artery: The pulmonary artery is not well seen. Venous: The inferior vena cava is normal in size with greater than 50% respiratory variability, suggesting right atrial pressure of 3 mmHg. IAS/Shunts: No atrial level shunt detected by color flow Doppler.  LEFT VENTRICLE PLAX 2D LVIDd:         4.70 cm LVIDs:         3.30 cm LV PW:         1.25 cm LV IVS:        1.40 cm LVOT diam:     2.00 cm LV SV:         58 ml LV SV Index:   27.70 LVOT Area:     3.14 cm  RIGHT VENTRICLE             IVC RV Basal diam:  3.10 cm     IVC diam: 1.20 cm RV S prime:     15.70 cm/s TAPSE (M-mode): 2.3 cm LEFT ATRIUM           Index       RIGHT ATRIUM           Index LA  diam:      3.10 cm 1.51 cm/m  RA Area:     15.80 cm LA Vol (A2C): 42.8 ml 20.80 ml/m RA Volume:   40.20 ml  19.54 ml/m LA Vol (A4C): 53.2  ml 25.85 ml/m  AORTIC VALVE LVOT Vmax:   125.00 cm/s LVOT Vmean:  80.740 cm/s LVOT VTI:    0.227 m  AORTA Ao Root diam: 3.30 cm Ao Asc diam:  3.10 cm  SHUNTS Systemic VTI:  0.23 m Systemic Diam: 2.00 cm  Buford Dresser MD Electronically signed by Buford Dresser MD Signature Date/Time: 01/06/2020/1:14:41 PM    Final    US Abdomen Limited RUQ  Result Date: 01/05/2020 CLINICAL DATA:  Abdominal pain for 2 weeks EXAM: ULTRASOUND ABDOMEN LIMITED RIGHT UPPER QUADRANT COMPARISON:  None. Chest x-ray 01/05/2020 FINDINGS: Gallbladder: Slightly distended. Moderate sludge in the gallbladder. No shadowing stones. Normal wall thickness. Negative sonographic Murphy Common bile duct: Diameter: 6 mm Liver: Liver is slightly echogenic. Multiple cysts within the liver. The largest is seen in the posterior right hepatic lobe and measures 2.3 x 1.8 x 1.6 cm. Portal vein is patent on color Doppler imaging with normal direction of blood flow towards the liver. Other: None. IMPRESSION: 1. Slightly distended gallbladder with sludge, but negative for wall thickness, sonographic Murphy, or other features to suggest acute gallbladder disease. Common duct diameter upper limits of normal 2. Slightly echogenic liver with multiple cysts Electronically Signed   By: Donavan Foil M.D.   On: 01/05/2020 18:07     Principal Problem:   Atrial fibrillation with RVR ([place]) Active Problems:   HTN (hypertension)   HLD (hyperlipidemia)   Renal insufficiency   Coagulopathy ([facility])   Liver failure ([place])   Hyperglycemia     LOS: 2 days   Tye Savoy ,NP 01/07/2020, 9:16 AM

## 2020-01-07 NOTE — Progress Notes (Signed)
Cardiology Progress Note  Patient ID: Frank Daugherty MRN: [id number] DOB: 1946/05/23 Date of Encounter: 01/07/2020  Primary Cardiologist: Evalina Field, MD  Subjective  Remains in normal sinus rhythm.  Has frequent PVCs.  Also has frequent PACs.  Heart rate in the 90-100 range.  MRI suggestive of possible multiple myeloma?.  Gastroenterology still on board.  ROS:  All other ROS reviewed and negative. Pertinent positives noted in the HPI.     Inpatient Medications  Scheduled Meds: . insulin aspart  0-5 Units Subcutaneous QHS  . insulin aspart  0-6 Units Subcutaneous TID WC  . metoprolol succinate  50 mg Oral Daily  . sodium chloride flush  3 mL Intravenous Q12H   Continuous Infusions: . sodium chloride 75 mL/hr at 01/07/20 0642   PRN Meds: ondansetron **OR** ondansetron (ZOFRAN) IV   Vital Signs   Vitals:   01/06/20 0711 01/06/20 1700 01/06/20 2114 01/07/20 0500  BP: 112/70  (!) 120/98 (!) 112/51  Pulse: 71 70 81 (!) 51  Resp: '18  18 18  ' Temp: 98 F (36.7 C) 99 F (37.2 C) 98.6 F (37 C) 97.6 F (36.4 C)  TempSrc:  Oral Oral Oral  SpO2: 100% 93% 100% 100%  Weight:    69.3 kg    Intake/Output Summary (Last 24 hours) at 01/07/2020 0911 Last data filed at 01/06/2020 2335 Gross per 24 hour  Intake 0 ml  Output 1625 ml  Net -1625 ml   Last 3 Weights 01/07/2020 04/23/2012 04/15/2012  Weight (lbs) 152 lb 12.5 oz 194 lb 8 oz 198 lb  Weight (kg) 69.3 kg 88.225 kg 89.812 kg      Telemetry  Overnight telemetry shows sinus rhythm to sinus tachycardia, heart rate 90 to low 100s, frequent PACs and PVCs noted, which I personally reviewed.   ECG  The most recent ECG shows sinus tachycardia, heart rate 108, PVC noted, which I personally reviewed.   Physical Exam   Vitals:   01/06/20 0711 01/06/20 1700 01/06/20 2114 01/07/20 0500  BP: 112/70  (!) 120/98 (!) 112/51  Pulse: 71 70 81 (!) 51  Resp: '18  18 18  ' Temp: 98 F (36.7 C) 99 F (37.2 C) 98.6 F (37 C) 97.6 F (36.4  C)  TempSrc:  Oral Oral Oral  SpO2: 100% 93% 100% 100%  Weight:    69.3 kg     Intake/Output Summary (Last 24 hours) at 01/07/2020 0911 Last data filed at 01/06/2020 2335 Gross per 24 hour  Intake 0 ml  Output 1625 ml  Net -1625 ml    Last 3 Weights 01/07/2020 04/23/2012 04/15/2012  Weight (lbs) 152 lb 12.5 oz 194 lb 8 oz 198 lb  Weight (kg) 69.3 kg 88.225 kg 89.812 kg    Body mass index is 22.08 kg/m.  General: Well nourished, well developed, in no acute distress Head: Atraumatic, normal size  Eyes: PEERLA, EOMI  Neck: Supple, no JVD Endocrine: No thryomegaly Cardiac: Normal S1, S2; RRR; no murmurs, rubs, or gallops Lungs: Clear to auscultation bilaterally, no wheezing, rhonchi or rales  Abd: Soft, nontender, no hepatomegaly  Ext: No edema, pulses 2+ Musculoskeletal: No deformities, BUE and BLE strength normal and equal Skin: Warm and dry, no rashes   Neuro: Alert and oriented to person, place, time, and situation, CNII-XII grossly intact, no focal deficits  Psych: Normal mood and affect   Labs  High Sensitivity Troponin:  No results for input(s): TROPONINIHS in the last 720 hours.   Cardiac EnzymesNo results  for input(s): TROPONINI in the last 168 hours. No results for input(s): TROPIPOC in the last 168 hours.  Chemistry Recent Labs  Lab 01/05/20 1433 01/05/20 1530 01/06/20 0338 01/07/20 0430  NA 143  --  142  --   K 4.5  --  4.3  --   CL 105  --  108  --   CO2 20*  --  24  --   GLUCOSE 244*  --  131*  --   BUN 26*  --  21  --   CREATININE 1.43*  --  0.90  --   CALCIUM 8.4*  --  8.1*  --   PROT  --  6.5 5.6* 6.0*  ALBUMIN  --  1.9* 1.8* 1.7*  AST  --  83* 65* 53*  ALT  --  49* 43 42  ALKPHOS  --  974* 882* 792*  BILITOT  --  3.0* 1.5* 1.4*  GFRNONAA 48*  --  >60  --   GFRAA 56*  --  >60  --   ANIONGAP 18*  --  10  --     Hematology Recent Labs  Lab 01/05/20 1433 01/06/20 0338  WBC 6.2 5.1  RBC 4.39 3.73*  HGB 11.6* 9.9*  HCT 35.6* 29.6*  MCV 81.1  79.4*  MCH 26.4 26.5  MCHC 32.6 33.4  RDW 15.8* 15.6*  PLT 140* 127*   BNPNo results for input(s): BNP, PROBNP in the last 168 hours.  DDimer No results for input(s): DDIMER in the last 168 hours.   Radiology  DG Chest Port 1 View  Result Date: 01/05/2020 CLINICAL DATA:  Weakness EXAM: PORTABLE CHEST 1 VIEW COMPARISON:  None. FINDINGS: The heart size and mediastinal contours are within normal limits. No focal airspace consolidation, pleural effusion, or pneumothorax. The visualized skeletal structures are unremarkable. IMPRESSION: No active disease. Electronically Signed   By: Davina Poke D.O.   On: 01/05/2020 14:15   MR ABDOMEN MRCP W WO CONTAST  Result Date: 01/06/2020 CLINICAL DATA:  Painless jaundice and weight loss. EXAM: MRI ABDOMEN WITHOUT AND WITH CONTRAST (INCLUDING MRCP) TECHNIQUE: Multiplanar multisequence MR imaging of the abdomen was performed both before and after the administration of intravenous contrast. Heavily T2-weighted images of the biliary and pancreatic ducts were obtained, and three-dimensional MRCP images were rendered by post processing. CONTRAST:  21m GADAVIST GADOBUTROL 1 MMOL/ML IV SOLN COMPARISON:  Ultrasound evaluation 01/05/2020 FINDINGS: Lower chest: Assessment of the lung bases is limited on MR. No signs of consolidation or pleural effusion. Hepatobiliary: Numerous hepatic cysts, signs of hepatic iron deposition. Is. Sludge layering in the dependent gallbladder. No signs of filling defect in the biliary tree. Pancreas: Limited assessment due to respiratory motion. No signs of ductal dilation or focal lesion. Arterial phase imaging is showing reasonable quality. Spleen:  Normal size spleen with signs of splenic iron deposition. Adrenals/Urinary Tract: Normal adrenals. Symmetric enhancement of bilateral kidneys. No signs of hydronephrosis. Stomach/Bowel: Visualized gastrointestinal tract is unremarkable with very limited assessment due to respiratory motion and  without protocol for bowel assessment on MR. Vascular/Lymphatic: Patent abdominal vasculature. No signs of adenopathy. Other:  Trace ascites about the liver. Musculoskeletal: Very heterogeneous marrow signal with signs of iron deposition in the marrow spaces. Assessment on in and out of phase is limited. Scattered areas of relative restricted diffusion are noted though diffusion is limited in the setting of iron deposition. Heterogeneous patchy marrow space enhancement is noted throughout. IMPRESSION: 1. Marked heterogeneity of the visualized marrow spaces  of the spine and pelvis. Areas of restricted diffusion and abnormal enhancement on a background of iron deposition. Correlate with any history of chronic anemia and exogenous iron administration or transfusions. Findings raise the question of process such as multiple myeloma, perhaps this would explain the elevated alkaline phosphatase which is more pronounced than other hepatic enzyme elevations. 2. Numerous hepatic cysts and signs of hepatic and splenic iron deposition. 3. Trace ascites about the liver. 4. No signs of biliary ductal dilation or filling defect. 5. Sludge layering in a mildly distended gallbladder. Electronically Signed   By: Zetta Bills M.D.   On: 01/06/2020 19:56   ECHOCARDIOGRAM COMPLETE  Result Date: 01/06/2020   ECHOCARDIOGRAM REPORT   Patient Name:   MACHAEL RAINE Date of Exam: 01/06/2020 Medical Rec #:  [id number]     Height:       69.8 in Accession #:    [id number]    Weight:       194.5 lb Date of Birth:  1946-10-18    BSA:          2.06 m Patient Age:    74 years      BP:           112/70 mmHg Patient Gender: M             HR:           120 bpm. Exam Location:  Inpatient Procedure: 2D Echo Indications:    Atrial fibrillation  History:        Patient has no prior history of Echocardiogram examinations.                 Risk Factors:Hypertension and Dyslipidemia.  Sonographer:    Clayton Lefort RDCS (AE) Referring Phys: [id number] [place]  1. Left ventricular ejection fraction, by visual estimation, is 55 to 60%. The left ventricle has normal function. There is mildly increased left ventricular hypertrophy.  2. Left ventricular diastolic function could not be evaluated.  3. The left ventricle has no regional wall motion abnormalities.  4. Global right ventricle has normal systolic function.The right ventricular size is normal. No increase in right ventricular wall thickness.  5. Left atrial size was normal.  6. Right atrial size was normal.  7. Mild mitral annular calcification.  8. The mitral valve is grossly normal. Mild mitral valve regurgitation.  9. The tricuspid valve is normal in structure. 10. The tricuspid valve is normal in structure. Tricuspid valve regurgitation is trivial. 11. The aortic valve is tricuspid. Aortic valve regurgitation is not visualized. 12. The pulmonic valve was not well visualized. Pulmonic valve regurgitation is not visualized. 13. The inferior vena cava is normal in size with greater than 50% respiratory variability, suggesting right atrial pressure of 3 mmHg. FINDINGS  Left Ventricle: Left ventricular ejection fraction, by visual estimation, is 55 to 60%. The left ventricle has normal function. The left ventricle has no regional wall motion abnormalities. There is mildly increased left ventricular hypertrophy. Concentric left ventricular hypertrophy. The left ventricular diastology could not be evaluated due to nondiagnostic images. Left ventricular diastolic function could not be evaluated. Right Ventricle: The right ventricular size is normal. No increase in right ventricular wall thickness. Global RV systolic function is has normal systolic function. Left Atrium: Left atrial size was normal in size. Right Atrium: Right atrial size was normal in size Pericardium: There is no evidence of pericardial effusion. Mitral Valve: The mitral valve is grossly normal. There is mild  thickening of the mitral  valve leaflet(s). There is mild calcification of the mitral valve leaflet(s). Mild mitral annular calcification. Mild mitral valve regurgitation. Tricuspid Valve: The tricuspid valve is normal in structure. Tricuspid valve regurgitation is trivial. Aortic Valve: The aortic valve is tricuspid. . There is mild thickening and mild calcification of the aortic valve. Aortic valve regurgitation is not visualized. There is mild thickening of the aortic valve. There is mild calcification of the aortic valve. Pulmonic Valve: The pulmonic valve was not well visualized. Pulmonic valve regurgitation is not visualized. Pulmonic regurgitation is not visualized. Aorta: The aortic root, ascending aorta and aortic arch are all structurally normal, with no evidence of dilitation or obstruction. Pulmonary Artery: The pulmonary artery is not well seen. Venous: The inferior vena cava is normal in size with greater than 50% respiratory variability, suggesting right atrial pressure of 3 mmHg. IAS/Shunts: No atrial level shunt detected by color flow Doppler.  LEFT VENTRICLE PLAX 2D LVIDd:         4.70 cm LVIDs:         3.30 cm LV PW:         1.25 cm LV IVS:        1.40 cm LVOT diam:     2.00 cm LV SV:         58 ml LV SV Index:   27.70 LVOT Area:     3.14 cm  RIGHT VENTRICLE             IVC RV Basal diam:  3.10 cm     IVC diam: 1.20 cm RV S prime:     15.70 cm/s TAPSE (M-mode): 2.3 cm LEFT ATRIUM           Index       RIGHT ATRIUM           Index LA diam:      3.10 cm 1.51 cm/m  RA Area:     15.80 cm LA Vol (A2C): 42.8 ml 20.80 ml/m RA Volume:   40.20 ml  19.54 ml/m LA Vol (A4C): 53.2 ml 25.85 ml/m  AORTIC VALVE LVOT Vmax:   125.00 cm/s LVOT Vmean:  80.740 cm/s LVOT VTI:    0.227 m  AORTA Ao Root diam: 3.30 cm Ao Asc diam:  3.10 cm  SHUNTS Systemic VTI:  0.23 m Systemic Diam: 2.00 cm  Buford Dresser MD Electronically signed by Buford Dresser MD Signature Date/Time: 01/06/2020/1:14:41 PM    Final    US Abdomen Limited  RUQ  Result Date: 01/05/2020 CLINICAL DATA:  Abdominal pain for 2 weeks EXAM: ULTRASOUND ABDOMEN LIMITED RIGHT UPPER QUADRANT COMPARISON:  None. Chest x-ray 01/05/2020 FINDINGS: Gallbladder: Slightly distended. Moderate sludge in the gallbladder. No shadowing stones. Normal wall thickness. Negative sonographic Murphy Common bile duct: Diameter: 6 mm Liver: Liver is slightly echogenic. Multiple cysts within the liver. The largest is seen in the posterior right hepatic lobe and measures 2.3 x 1.8 x 1.6 cm. Portal vein is patent on color Doppler imaging with normal direction of blood flow towards the liver. Other: None. IMPRESSION: 1. Slightly distended gallbladder with sludge, but negative for wall thickness, sonographic Murphy, or other features to suggest acute gallbladder disease. Common duct diameter upper limits of normal 2. Slightly echogenic liver with multiple cysts Electronically Signed   By: Donavan Foil M.D.   On: 01/05/2020 18:07    Cardiac Studies   TTE 01/06/2020  1. Left ventricular ejection fraction, by visual estimation, is 55 to  60%. The left ventricle has normal function. There is mildly increased left ventricular hypertrophy.  2. Left ventricular diastolic function could not be evaluated.  3. The left ventricle has no regional wall motion abnormalities.  4. Global right ventricle has normal systolic function.The right ventricular size is normal. No increase in right ventricular wall thickness.  5. Left atrial size was normal.  6. Right atrial size was normal.  7. Mild mitral annular calcification.  8. The mitral valve is grossly normal. Mild mitral valve regurgitation.  9. The tricuspid valve is normal in structure. 10. The tricuspid valve is normal in structure. Tricuspid valve regurgitation is trivial. 11. The aortic valve is tricuspid. Aortic valve regurgitation is not visualized. 12. The pulmonic valve was not well visualized. Pulmonic valve regurgitation is not  visualized. 13. The inferior vena cava is normal in size with greater than 50% respiratory variability, suggesting right atrial pressure of 3 mmHg.  Patient Profile  Ladarrian Asencio is a 74 y.o. male with history of hypertension, not on medications, admitted for presyncope and atrial fibrillation with RVR.  Laboratory data concerning for underlying malignancy which is not identified.  Assessment & Plan   1.  New onset atrial fibrillation with RVR -He is not had any ventricular tachycardia.  This is all secondary to what ever underlying processes going on.  LVEF is normal.  No significant valvular heart disease. -Holding anticoagulation as he is coagulopathic.  INR pending today. -I transition him to metoprolol succinate 50 mg daily mainly for ectopy suppression.  No further recurrence of atrial fibrillation.  2.  Frequent PVCs -No symptoms.  LVEF is normal. -We will transition him to metoprolol succinate see if we can get better control of these. -Potassium is pending from today.  Magnesium and potassium were normal yesterday.  For questions or updates, please contact [place] Please consult [email or web address] for contact info under   Signed, [place] T. Audie Box, [place]  01/07/2020 9:11 AM

## 2020-01-08 DIAGNOSIS — D689 Coagulation defect, unspecified: Secondary | ICD-10-CM

## 2020-01-08 LAB — HEPATIC FUNCTION PANEL
ALT: 32 U/L (ref 0–44)
AST: 31 U/L (ref 15–41)
Albumin: 1.6 g/dL — ABNORMAL LOW (ref 3.5–5.0)
Alkaline Phosphatase: 662 U/L — ABNORMAL HIGH (ref 38–126)
Bilirubin, Direct: 0.4 mg/dL — ABNORMAL HIGH (ref 0.0–0.2)
Indirect Bilirubin: 0.8 mg/dL (ref 0.3–0.9)
Total Bilirubin: 1.2 mg/dL (ref 0.3–1.2)
Total Protein: 5.2 g/dL — ABNORMAL LOW (ref 6.5–8.1)

## 2020-01-08 LAB — PROTIME-INR
INR: 1.6 — ABNORMAL HIGH (ref 0.8–1.2)
Prothrombin Time: 18.8 seconds — ABNORMAL HIGH (ref 11.4–15.2)

## 2020-01-08 LAB — GLUCOSE, CAPILLARY
Glucose-Capillary: 128 mg/dL — ABNORMAL HIGH (ref 70–99)
Glucose-Capillary: 206 mg/dL — ABNORMAL HIGH (ref 70–99)
Glucose-Capillary: 104 mg/dL — ABNORMAL HIGH (ref 70–99)
Glucose-Capillary: 128 mg/dL — ABNORMAL HIGH (ref 70–99)

## 2020-01-08 LAB — NUCLEOTIDASE, 5', BLOOD: 5-Nucleotidase: 9 IU/L (ref 0–10)

## 2020-01-08 MED ORDER — VITAMIN K1 10 MG/ML IJ SOLN
10.0000 mg | Freq: Every day | INTRAMUSCULAR | Status: AC
  Administered 2020-01-08 – 2020-01-09 (×2): 10 mg via SUBCUTANEOUS
  Filled 2020-01-08 (×2): qty 1

## 2020-01-08 MED ORDER — METOPROLOL SUCCINATE ER 100 MG PO TB24
100.0000 mg | ORAL_TABLET | Freq: Every day | ORAL | Status: DC
  Administered 2020-01-08 – 2020-01-09 (×2): 100 mg via ORAL
  Filled 2020-01-08 (×2): qty 1

## 2020-01-08 NOTE — Progress Notes (Addendum)
Progress Note    Frank Daugherty  XLK:[id number] DOB: [birth year]  DOA: 01/05/2020 PCP: Patient, No Pcp Per      Brief Narrative:    Medical records reviewed and are as summarized below:  Frank Daugherty is an 74 y.o. male with medical history significant for hypertension, has not seen a physician in several years, and now presents the ED after an episode of near syncope.  Patient said he was in his usual state of health until he developed acute lightheadedness and generalized weakness upon standing, feeling as though he might pass out, and improved some after sitting down.  ED Course: Upon arrival to the ED, patient is found to be afebrile, saturating low 90s on room air, slightly tachypneic, tachycardic to the 130s, and with blood pressure 90/64.  EKG features atrial fibrillation with rate 125.  Chest x-ray is negative for acute cardiopulmonary disease.  Right upper quadrant ultrasound with slightly distended gallbladder with sludge but no wall thickening or Murphy sign to suggest acute gallbladder disease.  CBD dimensions are upper limit of normal on the ultrasound.  Chemistry panel concerning for alkaline phosphatase 974, albumin 1.9, AST 83, ALT 49, total bilirubin 3.0, and creatinine 1.43, all normal in 2013.  COVID-19 PCR is negative.  CBC with mild anemia and thrombocytopenia.  INR is elevated to 2.4.  Cardiology and gastroenterology were consulted by the ED physician, 1 L normal saline was administered, and hospitalists asked to admit.        Assessment/Plan:   Principal Problem:   Atrial fibrillation with RVR (HCC) Active Problems:   HTN (hypertension)   HLD (hyperlipidemia)   Renal insufficiency   Coagulopathy (HCC)   Hyperglycemia   Abnormal liver function tests   Abnormal serum level of alkaline phosphatase   Abnormal MRI of abdomen   Atrial fibrillation with RVR   He converted to NSR and has had intermittent sinus tachycardia. 2D echo showed EF estimated at 55  to 60%, moderately increased LVH.  Diastolic function could not be evaluated on echo. Continue metoprolol.  No anticoagulation for now since he is coagulopathic.  Follow-up with cardiologist as outpatient.  Liver disease; coagulopathy, hypoalbuminemia, elevated liver enzymes ALP is trending down.  AST and ALT are normal.  - No abdominal pain or obstructive process on RUQ [place]  Hepatitis A, B and C were nonreactive.  MRCP abdomen showed numerous hepatic cysts and signs of hepatic and splenic iron deposition. Ferritin level is very high >7,500 but iron level is normal. GI recommends vitamin K for coagulopathy.  Monitor INR.  Follow-up with gastroenterologist.  Abnormal heterogeneity of marrow spaces of the spine and pelvis suspicious for multiple myeloma Consulted oncologist, Dr. Burr Medico.  Work-up in progress.  Acute kidney injury: Improved Creatinine has improved.  Hypertension  - Continue metoprolol  Type 2 diabetes mellitus with hyperglycemia  Hemoglobin A1c was 6.9. NovoLog as needed for hyperglycemia.  Acute urinary retention Continue Flomax and Foley catheter.  Attempt voiding trial tomorrow.     Family Communication/Anticipated D/C date and plan/Code Status   DVT prophylaxis: SCDs Code Status: Full code Family Communication: Plan discussed with patient Disposition Plan: Possible discharge to home in 2 to 3 days      Subjective:   No complaints.  No chest pain, shortness of breath or palpitations.  No abdominal pain or vomiting.  His heart rate went up into the 140s when he got out of bed with assistance from nursing staff.  I was at the  bedside and I noticed that occurred on telemetry was between 140 and 150, sinus tachycardia   Objective:    Vitals:   01/07/20 1611 01/07/20 2112 01/08/20 0549 01/08/20 1022  BP: 127/82 132/74 120/84 116/68  Pulse:  (!) 48 93 (!) 107  Resp:      Temp:  98 F (36.7 C) 98.7 F (37.1 C)   TempSrc:  Oral Oral   SpO2:  100% 99%     Weight:   70.1 kg     Intake/Output Summary (Last 24 hours) at 01/08/2020 1145 Last data filed at 01/08/2020 0544 Gross per 24 hour  Intake 723 ml  Output 525 ml  Net 198 ml   Filed Weights   01/07/20 0500 01/08/20 0549  Weight: 69.3 kg 70.1 kg    Exam:  GEN: NAD SKIN: No rash EYES: EOMI ENT: MMM CV: regular rhythm but slightly tachycardic PULM: CTA B ABD: soft, ND, NT, +BS CNS: AAO x 3, non focal EXT: No edema or tenderness   Data Reviewed:   I have personally reviewed following labs and imaging studies:  Labs: Labs show the following:   Basic Metabolic Panel: Recent Labs  Lab 01/05/20 1433 01/05/20 1433 01/06/20 0338 01/07/20 0953 01/07/20 1002  NA 143  --  142 147*  --   K 4.5   < > 4.3 3.8  --   CL 105  --  108 117*  --   CO2 20*  --  24 18*  --   GLUCOSE 244*  --  131* 139*  --   BUN 26*  --  21 20  --   CREATININE 1.43*  --  0.90 1.21  --   CALCIUM 8.4*  --  8.1* 7.9*  --   MG 2.6*  --   --   --  2.5*   < > = values in this interval not displayed.   GFR CrCl cannot be calculated (Unknown ideal weight.). Liver Function Tests: Recent Labs  Lab 01/05/20 1530 01/06/20 0338 01/07/20 0430 01/08/20 0509  AST 83* 65* 53* 31  ALT 49* 43 42 32  ALKPHOS 974* 882* 792* 662*  BILITOT 3.0* 1.5* 1.4* 1.2  PROT 6.5 5.6* 6.0* 5.2*  ALBUMIN 1.9* 1.8* 1.7* 1.6*   No results for input(s): LIPASE, AMYLASE in the last 168 hours. No results for input(s): AMMONIA in the last 168 hours. Coagulation profile Recent Labs  Lab 01/05/20 1433 01/06/20 0338 01/07/20 0953 01/08/20 1009  INR 2.4* 2.0* 1.8* 1.6*    CBC: Recent Labs  Lab 01/05/20 1433 01/06/20 0338 01/07/20 1002  WBC 6.2 5.1 6.0  NEUTROABS  --  2.2  --   HGB 11.6* 9.9* 9.6*  HCT 35.6* 29.6* 29.7*  MCV 81.1 79.4* 80.7  PLT 140* 127* 123*   Cardiac Enzymes: No results for input(s): CKTOTAL, CKMB, CKMBINDEX, TROPONINI in the last 168 hours. BNP (last 3 results) No results for input(s):  PROBNP in the last 8760 hours. CBG: Recent Labs  Lab 01/07/20 1127 01/07/20 1719 01/07/20 2114 01/08/20 0759 01/08/20 1118  GLUCAP 108* 188* 84 128* 206*   D-Dimer: No results for input(s): DDIMER in the last 72 hours. Hgb A1c: Recent Labs    01/06/20 0338  HGBA1C 6.9*   Lipid Profile: No results for input(s): CHOL, HDL, LDLCALC, TRIG, CHOLHDL, LDLDIRECT in the last 72 hours. Thyroid function studies: Recent Labs    01/05/20 1433  TSH 2.281   Anemia work up: [employer]    01/07/20  1002  FERRITIN >7,500*  TIBC 147*  IRON 115   Sepsis Labs: Recent Labs  Lab 01/05/20 1433 01/06/20 0338 01/07/20 1002  WBC 6.2 5.1 6.0    Microbiology Recent Results (from the past 240 hour(s))  Respiratory Panel by RT PCR (Flu A&B, Covid) - Nasopharyngeal Swab     Status: None   Collection Time: 01/05/20  2:43 PM   Specimen: Nasopharyngeal Swab  Result Value Ref Range Status   SARS Coronavirus 2 by RT PCR NEGATIVE NEGATIVE Final    Comment: (NOTE) SARS-CoV-2 target nucleic acids are NOT DETECTED. The SARS-CoV-2 RNA is generally detectable in upper respiratoy specimens during the acute phase of infection. The lowest concentration of SARS-CoV-2 viral copies this assay can detect is 131 copies/mL. A negative result does not preclude SARS-Cov-2 infection and should not be used as the sole basis for treatment or other patient management decisions. A negative result may occur with  improper specimen collection/handling, submission of specimen other than nasopharyngeal swab, presence of viral mutation(s) within the areas targeted by this assay, and inadequate number of viral copies (<131 copies/mL). A negative result must be combined with clinical observations, patient history, and epidemiological information. The expected result is Negative. Fact Sheet for Patients:  PinkCheek.be Fact Sheet for Healthcare Providers:    GravelBags.it This test is not yet ap proved or cleared by the [place] FDA and  has been authorized for detection and/or diagnosis of SARS-CoV-2 by FDA under an Emergency Use Authorization (EUA). This EUA will remain  in effect (meaning this test can be used) for the duration of the COVID-19 declaration under Section 564(b)(1) of the Act, 21 U.S.C. section 360bbb-3(b)(1), unless the authorization is terminated or revoked sooner.    Influenza A by PCR NEGATIVE NEGATIVE Final   Influenza B by PCR NEGATIVE NEGATIVE Final    Comment: (NOTE) The Xpert Xpress SARS-CoV-2/FLU/RSV assay is intended as an aid in  the diagnosis of influenza from Nasopharyngeal swab specimens and  should not be used as a sole basis for treatment. Nasal washings and  aspirates are unacceptable for Xpert Xpress SARS-CoV-2/FLU/RSV  testing. Fact Sheet for Patients: PinkCheek.be Fact Sheet for Healthcare Providers: GravelBags.it This test is not yet approved or cleared by the [place] FDA and  has been authorized for detection and/or diagnosis of SARS-CoV-2 by  FDA under an Emergency Use Authorization (EUA). This EUA will remain  in effect (meaning this test can be used) for the duration of the  Covid-19 declaration under Section 564(b)(1) of the Act, 21  U.S.C. section 360bbb-3(b)(1), unless the authorization is  terminated or revoked. Performed at [facility], [place] [address], [place], [place] [address]     Procedures and diagnostic studies:  MR ABDOMEN MRCP W WO CONTAST  Result Date: 01/06/2020 CLINICAL DATA:  Painless jaundice and weight loss. EXAM: MRI ABDOMEN WITHOUT AND WITH CONTRAST (INCLUDING MRCP) TECHNIQUE: Multiplanar multisequence MR imaging of the abdomen was performed both before and after the administration of intravenous contrast. Heavily T2-weighted images of the biliary and pancreatic ducts  were obtained, and three-dimensional MRCP images were rendered by post processing. CONTRAST:  49m GADAVIST GADOBUTROL 1 MMOL/ML IV SOLN COMPARISON:  Ultrasound evaluation 01/05/2020 FINDINGS: Lower chest: Assessment of the lung bases is limited on MR. No signs of consolidation or pleural effusion. Hepatobiliary: Numerous hepatic cysts, signs of hepatic iron deposition. Is. Sludge layering in the dependent gallbladder. No signs of filling defect in the biliary tree. Pancreas: Limited assessment due to  respiratory motion. No signs of ductal dilation or focal lesion. Arterial phase imaging is showing reasonable quality. Spleen:  Normal size spleen with signs of splenic iron deposition. Adrenals/Urinary Tract: Normal adrenals. Symmetric enhancement of bilateral kidneys. No signs of hydronephrosis. Stomach/Bowel: Visualized gastrointestinal tract is unremarkable with very limited assessment due to respiratory motion and without protocol for bowel assessment on MR. Vascular/Lymphatic: Patent abdominal vasculature. No signs of adenopathy. Other:  Trace ascites about the liver. Musculoskeletal: Very heterogeneous marrow signal with signs of iron deposition in the marrow spaces. Assessment on in and out of phase is limited. Scattered areas of relative restricted diffusion are noted though diffusion is limited in the setting of iron deposition. Heterogeneous patchy marrow space enhancement is noted throughout. IMPRESSION: 1. Marked heterogeneity of the visualized marrow spaces of the spine and pelvis. Areas of restricted diffusion and abnormal enhancement on a background of iron deposition. Correlate with any history of chronic anemia and exogenous iron administration or transfusions. Findings raise the question of process such as multiple myeloma, perhaps this would explain the elevated alkaline phosphatase which is more pronounced than other hepatic enzyme elevations. 2. Numerous hepatic cysts and signs of hepatic and  splenic iron deposition. 3. Trace ascites about the liver. 4. No signs of biliary ductal dilation or filling defect. 5. Sludge layering in a mildly distended gallbladder. Electronically Signed   By: Zetta Bills M.D.   On: 01/06/2020 19:56    Medications:   . Chlorhexidine Gluconate Cloth  6 each Topical Daily  . insulin aspart  0-5 Units Subcutaneous QHS  . insulin aspart  0-6 Units Subcutaneous TID WC  . metoprolol succinate  100 mg Oral Daily  . phytonadione  10 mg Subcutaneous Daily  . sodium chloride flush  3 mL Intravenous Q12H  . tamsulosin  0.4 mg Oral QPC supper   Continuous Infusions:   LOS: 3 days   Gerrell Tabet  [facility]     01/08/2020, 11:45 AM

## 2020-01-08 NOTE — Progress Notes (Signed)
Progress Note    ASSESSMENT AND PLAN:   1.  Abnormal liver tests, predominantly cholestatic pattern. GGT is elevated but not as much as to be expected if this were all liver related. No evidence for choledocholithiasis on advanced imaging. Imaging does raise concern for multiple myeloma which can explain markedly elevated alk phos. Alk phose 662 today <<< 792 yesterday.  --Evidence for iron overload by imaging. 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 Tarri Glenn yesterday.  --Chronic liver disease not excluded but NO evidence for cirrhosis on imaging. Some if not all of the coagulopathy could be nutritional though only minimal improvement in INR after dose of vitamin K yesterday, Will repeat dose x 2. Marland Kitchen  --Hematology now following and evaluating the iron overload as well as checking for multiple myeloma.    2. New Afib with RVR. HR in 120's now, irregular. Asymptomatic. On toprol  SUBJECTIVE    Was having problems urinating / retention. Feels better with foley. No abdominal pain. Tolerating diet.    OBJECTIVE:     Vital signs in last 24 hours: Temp:  [98 F (36.7 C)-98.7 F (37.1 C)] 98.7 F (37.1 C) (01/29 0549) Pulse Rate:  [48-111] 93 (01/29 0549) BP: (120-138)/(71-89) 120/84 (01/29 0549) SpO2:  [99 %-100 %] 99 % (01/29 0549) Weight:  [70.1 kg] 70.1 kg (01/29 0549) Last BM Date: 01/07/20 General:   Alert, thin male in NAD EENT:  Normal hearing, non icteric sclera   Heart:  Irreg rate and rhythm;  No lower extremity edema   Pulm: Normal respiratory effort   Abdomen:  Soft, nondistended, nontender.  Normal bowel sounds.          Neurologic:  Alert and  oriented x4;  grossly normal neurologically. Psych:  Pleasant, cooperative.  Normal mood and affect.   Intake/Output from previous day: 01/28 0701 - 01/29 0700 In: 723 [P.O.:720; I.V.:3] Out: 1325 [Urine:1325] Intake/Output this shift: No intake/output data  recorded.  Lab Results: Recent Labs    01/05/20 1433 01/06/20 0338 01/07/20 1002  WBC 6.2 5.1 6.0  HGB 11.6* 9.9* 9.6*  HCT 35.6* 29.6* 29.7*  PLT 140* 127* 123*   BMET Recent Labs    01/05/20 1433 01/06/20 0338 01/07/20 0953  NA 143 142 147*  K 4.5 4.3 3.8  CL 105 108 117*  CO2 20* 24 18*  GLUCOSE 244* 131* 139*  BUN 26* 21 20  CREATININE 1.43* 0.90 1.21  CALCIUM 8.4* 8.1* 7.9*   LFT Recent Labs    01/08/20 0509  PROT 5.2*  ALBUMIN 1.6*  AST 31  ALT 32  ALKPHOS 662*  BILITOT 1.2  BILIDIR 0.4*  IBILI 0.8   PT/INR Recent Labs    01/06/20 0338 01/07/20 0953  LABPROT 22.6* 21.1*  INR 2.0* 1.8*   Hepatitis Panel Recent Labs    01/05/20 2030  HEPBSAG NON REACTIVE  HCVAB NON REACTIVE  HEPAIGM NON REACTIVE  HEPBIGM NON REACTIVE    MR ABDOMEN MRCP W WO CONTAST  Result Date: 01/06/2020 CLINICAL DATA:  Painless jaundice and weight loss. EXAM: MRI ABDOMEN WITHOUT AND WITH CONTRAST (INCLUDING MRCP) TECHNIQUE: Multiplanar multisequence MR imaging of the abdomen was performed both before and after the administration of intravenous contrast. Heavily T2-weighted images of the biliary and pancreatic ducts were obtained, and three-dimensional MRCP images were rendered by post processing. CONTRAST:  56m GADAVIST GADOBUTROL 1 MMOL/ML IV SOLN COMPARISON:  Ultrasound evaluation 01/05/2020 FINDINGS: Lower chest: Assessment  of the lung bases is limited on MR. No signs of consolidation or pleural effusion. Hepatobiliary: Numerous hepatic cysts, signs of hepatic iron deposition. Is. Sludge layering in the dependent gallbladder. No signs of filling defect in the biliary tree. Pancreas: Limited assessment due to respiratory motion. No signs of ductal dilation or focal lesion. Arterial phase imaging is showing reasonable quality. Spleen:  Normal size spleen with signs of splenic iron deposition. Adrenals/Urinary Tract: Normal adrenals. Symmetric enhancement of bilateral kidneys. No  signs of hydronephrosis. Stomach/Bowel: Visualized gastrointestinal tract is unremarkable with very limited assessment due to respiratory motion and without protocol for bowel assessment on MR. Vascular/Lymphatic: Patent abdominal vasculature. No signs of adenopathy. Other:  Trace ascites about the liver. Musculoskeletal: Very heterogeneous marrow signal with signs of iron deposition in the marrow spaces. Assessment on in and out of phase is limited. Scattered areas of relative restricted diffusion are noted though diffusion is limited in the setting of iron deposition. Heterogeneous patchy marrow space enhancement is noted throughout. IMPRESSION: 1. Marked heterogeneity of the visualized marrow spaces of the spine and pelvis. Areas of restricted diffusion and abnormal enhancement on a background of iron deposition. Correlate with any history of chronic anemia and exogenous iron administration or transfusions. Findings raise the question of process such as multiple myeloma, perhaps this would explain the elevated alkaline phosphatase which is more pronounced than other hepatic enzyme elevations. 2. Numerous hepatic cysts and signs of hepatic and splenic iron deposition. 3. Trace ascites about the liver. 4. No signs of biliary ductal dilation or filling defect. 5. Sludge layering in a mildly distended gallbladder. Electronically Signed   By: Zetta Bills M.D.   On: 01/06/2020 19:56   ECHOCARDIOGRAM COMPLETE  Result Date: 01/06/2020   ECHOCARDIOGRAM REPORT   Patient Name:   Frank Daugherty Date of Exam: 01/06/2020 Medical Rec #:  [id number]     Height:       69.8 in Accession #:    [id number]    Weight:       194.5 lb Date of Birth:  05/23/1946    BSA:          2.06 m Patient Age:    74 years      BP:           112/70 mmHg Patient Gender: M             HR:           120 bpm. Exam Location:  Inpatient Procedure: 2D Echo Indications:    Atrial fibrillation  History:        Patient has no prior history of  Echocardiogram examinations.                 Risk Factors:Hypertension and Dyslipidemia.  Sonographer:    Clayton Lefort RDCS (AE) Referring Phys: [id number] [place]  1. Left ventricular ejection fraction, by visual estimation, is 55 to 60%. The left ventricle has normal function. There is mildly increased left ventricular hypertrophy.  2. Left ventricular diastolic function could not be evaluated.  3. The left ventricle has no regional wall motion abnormalities.  4. Global right ventricle has normal systolic function.The right ventricular size is normal. No increase in right ventricular wall thickness.  5. Left atrial size was normal.  6. Right atrial size was normal.  7. Mild mitral annular calcification.  8. The mitral valve is grossly normal. Mild mitral valve regurgitation.  9. The tricuspid valve is normal in structure. 10.  The tricuspid valve is normal in structure. Tricuspid valve regurgitation is trivial. 11. The aortic valve is tricuspid. Aortic valve regurgitation is not visualized. 12. The pulmonic valve was not well visualized. Pulmonic valve regurgitation is not visualized. 13. The inferior vena cava is normal in size with greater than 50% respiratory variability, suggesting right atrial pressure of 3 mmHg. FINDINGS  Left Ventricle: Left ventricular ejection fraction, by visual estimation, is 55 to 60%. The left ventricle has normal function. The left ventricle has no regional wall motion abnormalities. There is mildly increased left ventricular hypertrophy. Concentric left ventricular hypertrophy. The left ventricular diastology could not be evaluated due to nondiagnostic images. Left ventricular diastolic function could not be evaluated. Right Ventricle: The right ventricular size is normal. No increase in right ventricular wall thickness. Global RV systolic function is has normal systolic function. Left Atrium: Left atrial size was normal in size. Right Atrium: Right atrial size was  normal in size Pericardium: There is no evidence of pericardial effusion. Mitral Valve: The mitral valve is grossly normal. There is mild thickening of the mitral valve leaflet(s). There is mild calcification of the mitral valve leaflet(s). Mild mitral annular calcification. Mild mitral valve regurgitation. Tricuspid Valve: The tricuspid valve is normal in structure. Tricuspid valve regurgitation is trivial. Aortic Valve: The aortic valve is tricuspid. . There is mild thickening and mild calcification of the aortic valve. Aortic valve regurgitation is not visualized. There is mild thickening of the aortic valve. There is mild calcification of the aortic valve. Pulmonic Valve: The pulmonic valve was not well visualized. Pulmonic valve regurgitation is not visualized. Pulmonic regurgitation is not visualized. Aorta: The aortic root, ascending aorta and aortic arch are all structurally normal, with no evidence of dilitation or obstruction. Pulmonary Artery: The pulmonary artery is not well seen. Venous: The inferior vena cava is normal in size with greater than 50% respiratory variability, suggesting right atrial pressure of 3 mmHg. IAS/Shunts: No atrial level shunt detected by color flow Doppler.  LEFT VENTRICLE PLAX 2D LVIDd:         4.70 cm LVIDs:         3.30 cm LV PW:         1.25 cm LV IVS:        1.40 cm LVOT diam:     2.00 cm LV SV:         58 ml LV SV Index:   27.70 LVOT Area:     3.14 cm  RIGHT VENTRICLE             IVC RV Basal diam:  3.10 cm     IVC diam: 1.20 cm RV S prime:     15.70 cm/s TAPSE (M-mode): 2.3 cm LEFT ATRIUM           Index       RIGHT ATRIUM           Index LA diam:      3.10 cm 1.51 cm/m  RA Area:     15.80 cm LA Vol (A2C): 42.8 ml 20.80 ml/m RA Volume:   40.20 ml  19.54 ml/m LA Vol (A4C): 53.2 ml 25.85 ml/m  AORTIC VALVE LVOT Vmax:   125.00 cm/s LVOT Vmean:  80.740 cm/s LVOT VTI:    0.227 m  AORTA Ao Root diam: 3.30 cm Ao Asc diam:  3.10 cm  SHUNTS Systemic VTI:  0.23 m Systemic  Diam: 2.00 cm  Buford Dresser MD Electronically signed by Buford Dresser MD  Signature Date/Time: 01/06/2020/1:14:41 PM    Final      Principal Problem:   Atrial fibrillation with RVR (HCC) Active Problems:   HTN (hypertension)   HLD (hyperlipidemia)   Renal insufficiency   Coagulopathy (HCC)   Liver failure (HCC)   Hyperglycemia   Abnormal liver function tests   Abnormal serum level of alkaline phosphatase   Abnormal MRI of abdomen     LOS: 3 days   Tye Savoy ,NP 01/08/2020, 9:13 AM

## 2020-01-08 NOTE — Progress Notes (Signed)
Cardiology Progress Note  Patient ID: Frank Daugherty MRN: [id number] DOB: Sep 23, 1946 Date of Encounter: 01/08/2020  Primary Cardiologist: Evalina Field, MD  Subjective  Remains in normal sinus rhythm.  Iron studies suggestive of severe iron overload.  No symptoms of congestive heart failure.  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  ROS:  All other ROS reviewed and negative. Pertinent positives noted in the HPI.     Inpatient Medications  Scheduled Meds: . Chlorhexidine Gluconate Cloth  6 each Topical Daily  . insulin aspart  0-5 Units Subcutaneous QHS  . insulin aspart  0-6 Units Subcutaneous TID WC  . metoprolol succinate  100 mg Oral Daily  . sodium chloride flush  3 mL Intravenous Q12H  . tamsulosin  0.4 mg Oral QPC supper   Continuous Infusions:  PRN Meds: diphenhydrAMINE, EPINEPHrine, ondansetron **OR** ondansetron (ZOFRAN) IV   Vital Signs   Vitals:   01/07/20 1555 01/07/20 1611 01/07/20 2112 01/08/20 0549  BP: 138/84 127/82 132/74 120/84  Pulse:   (!) 48 93  Resp:      Temp:   98 F (36.7 C) 98.7 F (37.1 C)  TempSrc:   Oral Oral  SpO2:   100% 99%  Weight:    70.1 kg    Intake/Output Summary (Last 24 hours) at 01/08/2020 0815 Last data filed at 01/08/2020 0544 Gross per 24 hour  Intake 723 ml  Output 1325 ml  Net -602 ml   Last 3 Weights 01/08/2020 01/07/2020 04/23/2012  Weight (lbs) 154 lb 8.7 oz 152 lb 12.5 oz 194 lb 8 oz  Weight (kg) 70.1 kg 69.3 kg 88.225 kg      Telemetry  Overnight telemetry shows sinus rhythm to sinus tachycardia, heart rate 90 to low 100s, frequent PACs and PVCs noted, which I personally reviewed.   ECG  The most recent ECG shows sinus tachycardia, heart rate 108, PVC noted, which I personally reviewed.   Physical Exam   Vitals:   01/07/20 1555 01/07/20 1611 01/07/20 2112 01/08/20 0549  BP: 138/84 127/82 132/74 120/84  Pulse:   (!) 48 93  Resp:      Temp:   98 F (36.7 C) 98.7 F (37.1 C)  TempSrc:    Oral Oral  SpO2:   100% 99%  Weight:    70.1 kg     Intake/Output Summary (Last 24 hours) at 01/08/2020 0815 Last data filed at 01/08/2020 0544 Gross per 24 hour  Intake 723 ml  Output 1325 ml  Net -602 ml    Last 3 Weights 01/08/2020 01/07/2020 04/23/2012  Weight (lbs) 154 lb 8.7 oz 152 lb 12.5 oz 194 lb 8 oz  Weight (kg) 70.1 kg 69.3 kg 88.225 kg    Body mass index is 22.33 kg/m.  General: Well nourished, well developed, in no acute distress Head: Atraumatic, normal size  Eyes: PEERLA, EOMI  Neck: Supple, no JVD Endocrine: No thryomegaly Cardiac: Normal S1, S2; RRR; no murmurs, rubs, or gallops Lungs: Clear to auscultation bilaterally, no wheezing, rhonchi or rales  Abd: Soft, nontender, no hepatomegaly  Ext: No edema, pulses 2+ Musculoskeletal: No deformities, BUE and BLE strength normal and equal Skin: Warm and dry, no rashes   Neuro: Alert and oriented to person, place, time, and situation, CNII-XII grossly intact, no focal deficits  Psych: Normal mood and affect   Labs  High Sensitivity Troponin:  No results for input(s): TROPONINIHS in the last 720 hours.   Cardiac EnzymesNo results for input(s): TROPONINI in the  last 168 hours. No results for input(s): TROPIPOC in the last 168 hours.  Chemistry Recent Labs  Lab 01/05/20 1433 01/05/20 1530 01/06/20 0338 01/07/20 0430 01/07/20 0953 01/08/20 0509  NA 143  --  142  --  147*  --   K 4.5  --  4.3  --  3.8  --   CL 105  --  108  --  117*  --   CO2 20*  --  24  --  18*  --   GLUCOSE 244*  --  131*  --  139*  --   BUN 26*  --  21  --  20  --   CREATININE 1.43*  --  0.90  --  1.21  --   CALCIUM 8.4*  --  8.1*  --  7.9*  --   PROT  --    < > 5.6* 6.0*  --  5.2*  ALBUMIN  --    < > 1.8* 1.7*  --  1.6*  AST  --    < > 65* 53*  --  31  ALT  --    < > 43 42  --  32  ALKPHOS  --    < > 882* 792*  --  662*  BILITOT  --    < > 1.5* 1.4*  --  1.2  GFRNONAA 48*  --  >60  --  59*  --   GFRAA 56*  --  >60  --  >60  --     ANIONGAP 18*  --  10  --  12  --    < > = values in this interval not displayed.    Hematology Recent Labs  Lab 01/05/20 1433 01/06/20 0338 01/07/20 1002  WBC 6.2 5.1 6.0  RBC 4.39 3.73* 3.68*  HGB 11.6* 9.9* 9.6*  HCT 35.6* 29.6* 29.7*  MCV 81.1 79.4* 80.7  MCH 26.4 26.5 26.1  MCHC 32.6 33.4 32.3  RDW 15.8* 15.6* 15.9*  PLT 140* 127* 123*   BNPNo results for input(s): BNP, PROBNP in the last 168 hours.  DDimer No results for input(s): DDIMER in the last 168 hours.   Radiology  MR ABDOMEN MRCP W WO CONTAST  Result Date: 01/06/2020 CLINICAL DATA:  Painless jaundice and weight loss. EXAM: MRI ABDOMEN WITHOUT AND WITH CONTRAST (INCLUDING MRCP) TECHNIQUE: Multiplanar multisequence MR imaging of the abdomen was performed both before and after the administration of intravenous contrast. Heavily T2-weighted images of the biliary and pancreatic ducts were obtained, and three-dimensional MRCP images were rendered by post processing. CONTRAST:  96m GADAVIST GADOBUTROL 1 MMOL/ML IV SOLN COMPARISON:  Ultrasound evaluation 01/05/2020 FINDINGS: Lower chest: Assessment of the lung bases is limited on MR. No signs of consolidation or pleural effusion. Hepatobiliary: Numerous hepatic cysts, signs of hepatic iron deposition. Is. Sludge layering in the dependent gallbladder. No signs of filling defect in the biliary tree. Pancreas: Limited assessment due to respiratory motion. No signs of ductal dilation or focal lesion. Arterial phase imaging is showing reasonable quality. Spleen:  Normal size spleen with signs of splenic iron deposition. Adrenals/Urinary Tract: Normal adrenals. Symmetric enhancement of bilateral kidneys. No signs of hydronephrosis. Stomach/Bowel: Visualized gastrointestinal tract is unremarkable with very limited assessment due to respiratory motion and without protocol for bowel assessment on MR. Vascular/Lymphatic: Patent abdominal vasculature. No signs of adenopathy. Other:  Trace  ascites about the liver. Musculoskeletal: Very heterogeneous marrow signal with signs of iron deposition in the marrow spaces. Assessment on  in and out of phase is limited. Scattered areas of relative restricted diffusion are noted though diffusion is limited in the setting of iron deposition. Heterogeneous patchy marrow space enhancement is noted throughout. IMPRESSION: 1. Marked heterogeneity of the visualized marrow spaces of the spine and pelvis. Areas of restricted diffusion and abnormal enhancement on a background of iron deposition. Correlate with any history of chronic anemia and exogenous iron administration or transfusions. Findings raise the question of process such as multiple myeloma, perhaps this would explain the elevated alkaline phosphatase which is more pronounced than other hepatic enzyme elevations. 2. Numerous hepatic cysts and signs of hepatic and splenic iron deposition. 3. Trace ascites about the liver. 4. No signs of biliary ductal dilation or filling defect. 5. Sludge layering in a mildly distended gallbladder. Electronically Signed   By: Zetta Bills M.D.   On: 01/06/2020 19:56   ECHOCARDIOGRAM COMPLETE  Result Date: 01/06/2020   ECHOCARDIOGRAM REPORT   Patient Name:   Frank Daugherty Date of Exam: 01/06/2020 Medical Rec #:  [id number]     Height:       69.8 in Accession #:    [id number]    Weight:       194.5 lb Date of Birth:  09/09/1946    BSA:          2.06 m Patient Age:    74 years      BP:           112/70 mmHg Patient Gender: M             HR:           120 bpm. Exam Location:  Inpatient Procedure: 2D Echo Indications:    Atrial fibrillation  History:        Patient has no prior history of Echocardiogram examinations.                 Risk Factors:Hypertension and Dyslipidemia.  Sonographer:    Clayton Lefort RDCS (AE) Referring Phys: [id number] [place]  1. Left ventricular ejection fraction, by visual estimation, is 55 to 60%. The left ventricle has normal function.  There is mildly increased left ventricular hypertrophy.  2. Left ventricular diastolic function could not be evaluated.  3. The left ventricle has no regional wall motion abnormalities.  4. Global right ventricle has normal systolic function.The right ventricular size is normal. No increase in right ventricular wall thickness.  5. Left atrial size was normal.  6. Right atrial size was normal.  7. Mild mitral annular calcification.  8. The mitral valve is grossly normal. Mild mitral valve regurgitation.  9. The tricuspid valve is normal in structure. 10. The tricuspid valve is normal in structure. Tricuspid valve regurgitation is trivial. 11. The aortic valve is tricuspid. Aortic valve regurgitation is not visualized. 12. The pulmonic valve was not well visualized. Pulmonic valve regurgitation is not visualized. 13. The inferior vena cava is normal in size with greater than 50% respiratory variability, suggesting right atrial pressure of 3 mmHg. FINDINGS  Left Ventricle: Left ventricular ejection fraction, by visual estimation, is 55 to 60%. The left ventricle has normal function. The left ventricle has no regional wall motion abnormalities. There is mildly increased left ventricular hypertrophy. Concentric left ventricular hypertrophy. The left ventricular diastology could not be evaluated due to nondiagnostic images. Left ventricular diastolic function could not be evaluated. Right Ventricle: The right ventricular size is normal. No increase in right ventricular wall thickness. Global RV systolic function  is has normal systolic function. Left Atrium: Left atrial size was normal in size. Right Atrium: Right atrial size was normal in size Pericardium: There is no evidence of pericardial effusion. Mitral Valve: The mitral valve is grossly normal. There is mild thickening of the mitral valve leaflet(s). There is mild calcification of the mitral valve leaflet(s). Mild mitral annular calcification. Mild mitral valve  regurgitation. Tricuspid Valve: The tricuspid valve is normal in structure. Tricuspid valve regurgitation is trivial. Aortic Valve: The aortic valve is tricuspid. . There is mild thickening and mild calcification of the aortic valve. Aortic valve regurgitation is not visualized. There is mild thickening of the aortic valve. There is mild calcification of the aortic valve. Pulmonic Valve: The pulmonic valve was not well visualized. Pulmonic valve regurgitation is not visualized. Pulmonic regurgitation is not visualized. Aorta: The aortic root, ascending aorta and aortic arch are all structurally normal, with no evidence of dilitation or obstruction. Pulmonary Artery: The pulmonary artery is not well seen. Venous: The inferior vena cava is normal in size with greater than 50% respiratory variability, suggesting right atrial pressure of 3 mmHg. IAS/Shunts: No atrial level shunt detected by color flow Doppler.  LEFT VENTRICLE PLAX 2D LVIDd:         4.70 cm LVIDs:         3.30 cm LV PW:         1.25 cm LV IVS:        1.40 cm LVOT diam:     2.00 cm LV SV:         58 ml LV SV Index:   27.70 LVOT Area:     3.14 cm  RIGHT VENTRICLE             IVC RV Basal diam:  3.10 cm     IVC diam: 1.20 cm RV S prime:     15.70 cm/s TAPSE (M-mode): 2.3 cm LEFT ATRIUM           Index       RIGHT ATRIUM           Index LA diam:      3.10 cm 1.51 cm/m  RA Area:     15.80 cm LA Vol (A2C): 42.8 ml 20.80 ml/m RA Volume:   40.20 ml  19.54 ml/m LA Vol (A4C): 53.2 ml 25.85 ml/m  AORTIC VALVE LVOT Vmax:   125.00 cm/s LVOT Vmean:  80.740 cm/s LVOT VTI:    0.227 m  AORTA Ao Root diam: 3.30 cm Ao Asc diam:  3.10 cm  SHUNTS Systemic VTI:  0.23 m Systemic Diam: 2.00 cm  Buford Dresser MD Electronically signed by Buford Dresser MD Signature Date/Time: 01/06/2020/1:14:41 PM    Final     Cardiac Studies   TTE 01/06/2020  1. Left ventricular ejection fraction, by visual estimation, is 55 to 60%. The left ventricle has normal  function. There is mildly increased left ventricular hypertrophy.  2. Left ventricular diastolic function could not be evaluated.  3. The left ventricle has no regional wall motion abnormalities.  4. Global right ventricle has normal systolic function.The right ventricular size is normal. No increase in right ventricular wall thickness.  5. Left atrial size was normal.  6. Right atrial size was normal.  7. Mild mitral annular calcification.  8. The mitral valve is grossly normal. Mild mitral valve regurgitation.  9. The tricuspid valve is normal in structure. 10. The tricuspid valve is normal in structure. Tricuspid valve regurgitation is trivial.  11. The aortic valve is tricuspid. Aortic valve regurgitation is not visualized. 12. The pulmonic valve was not well visualized. Pulmonic valve regurgitation is not visualized. 13. The inferior vena cava is normal in size with greater than 50% respiratory variability, suggesting right atrial pressure of 3 mmHg.  Patient Profile  Frank Daugherty is a 74 y.o. male with history of hypertension, not on medications, admitted for presyncope and atrial fibrillation with RVR.  Laboratory data concerning for underlying malignancy which is not identified.  Assessment & Plan   1.  New onset atrial fibrillation with RVR -He is not had any ventricular tachycardia.  This is all secondary to what ever underlying processes going on.  LVEF is normal.  No significant valvular heart disease. -There are concerns for iron overload.  Conditions such as hereditary hemochromatosis can involve heart.  He has no symptoms of congestive heart failure or cardiomyopathy.  His arrhythmias could be related to this.  We could possibly consider cardiac MRI as an outpatient.  I think we should await any further diagnostic work-up by hematology.  A cardiac MRI will be considered on outpatient basis and not done while in-house. 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  Awaiting work-up by hematology.  2.  Frequent PVCs -No symptoms.  LVEF is normal. -Increase metoprolol succinate 100 mg daily -This could be related to iron overload if it involves the heart.  No symptoms of cardiomyopathy.  Echocardiogram unremarkable. -We will talk about cardiac MRI as outpatient.  Really depends on his diagnosis.  [facility] will sign off.   Medication Recommendations: Metoprolol succinate 100 mg daily, no anticoagulation until coagulopathy has been fully evaluated.  I can decide if I will anticoagulate him on an outpatient basis after his diagnosis is made. Other recommendations (labs, testing, etc): None Follow up as an outpatient: I will arrange a follow-up appointment in 1 month from hospital discharge for Mr. Dusenbery to see me in clinic.  Signed, Addison Naegeli. Audie Box, [place]  01/08/2020 8:18 AM

## 2020-01-09 LAB — GLUCOSE, CAPILLARY
Glucose-Capillary: 118 mg/dL — ABNORMAL HIGH (ref 70–99)
Glucose-Capillary: 206 mg/dL — ABNORMAL HIGH (ref 70–99)
Glucose-Capillary: 136 mg/dL — ABNORMAL HIGH (ref 70–99)
Glucose-Capillary: 219 mg/dL — ABNORMAL HIGH (ref 70–99)
Glucose-Capillary: 236 mg/dL — ABNORMAL HIGH (ref 70–99)

## 2020-01-09 LAB — PROTIME-INR
INR: 1.6 — ABNORMAL HIGH (ref 0.8–1.2)
Prothrombin Time: 18.5 seconds — ABNORMAL HIGH (ref 11.4–15.2)

## 2020-01-09 MED ORDER — INFLUENZA VAC A&B SA ADJ QUAD 0.5 ML IM PRSY
0.5000 mL | PREFILLED_SYRINGE | INTRAMUSCULAR | Status: DC
  Filled 2020-01-09: qty 0.5

## 2020-01-09 MED ORDER — PNEUMOCOCCAL VAC POLYVALENT 25 MCG/0.5ML IJ INJ
0.5000 mL | INJECTION | INTRAMUSCULAR | Status: DC
  Filled 2020-01-09: qty 0.5

## 2020-01-09 NOTE — Progress Notes (Signed)
    Dr Debbe Mounts rounding note reviewed. Tele review shows SR with PACs, short runs of atach. No recurrent afib. 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 Continue toprol 100, just got increased dose for first time yesterday, if frequent ectopy continues would increase to 125mg  daily tomorrow. We will follow tele over the weekend, no further recs at this time. Dr Marisue Ivan considering possible outpatient cardiac MRI. Getting vit K for underlying elevated INR, INR trending down. Is stable over time as outpatient can reconsider anticoag.   We will follow tele over the weekend.        Merrily Pew, MD  01/09/2020, 7:20 AM

## 2020-01-09 NOTE — Progress Notes (Signed)
Progress Note    Frank Daugherty  KTG:[id number] DOB: 05-25-1946  DOA: 01/05/2020 PCP: Frank Daugherty, No Pcp Per   Brief Narrative:    Medical records reviewed and are as summarized below: Frank Daugherty is a 74 year old African-American male with past medical history significant for hypertension.  Apparently, Frank Daugherty has not seen a physician in several years.  Frank Daugherty was admitted following an episode of near syncope.  Frank Daugherty said he was in his usual state of health until he developed acute lightheadedness and generalized weakness upon standing, feeling as though he might pass out, and improved some after sitting down.  On presentation to the ER, work-up done revealed slightly tachypnea, tachycardia to the 130 beats per minute, and with blood pressure 90/64.  EKG revealed atrial fibrillation with rate 125.  Chest x-ray was negative for acute cardiopulmonary disease.  Right upper quadrant ultrasound revealed slightly distended gallbladder with sludge but no wall thickening or Murphy sign to suggest acute gallbladder disease.  CBD dimensions are upper limit of normal on the ultrasound.  Chemistry panel concerning for alkaline phosphatase 974, albumin 1.9, AST 83, ALT 49, total bilirubin 3.0, and creatinine 1.43, all normal in 2013.  COVID-19 PCR is negative.  CBC with mild anemia and thrombocytopenia.  INR is elevated to 2.4.  Ferritin is elevated (greater than 7500).  There are also concerns for possible hemochromatosis.  Input from cardiology, GI and oncology team is appreciated.  Hematology team has ordered HFE gene mutation test.  Bone marrow aspiration biopsy is on hold as oncology/hematology team does not think multiple myeloma is likely.    01/09/2020: Frank Daugherty seen.  No new complaints.  No syncopal episodes.  No palpitations.  No fever or chills.  Assessment/Plan:   Principal Problem:   Atrial fibrillation with RVR (HCC) Active Problems:   HTN (hypertension)   HLD (hyperlipidemia)   Renal  insufficiency   Coagulopathy (HCC)   Hyperglycemia   Abnormal liver function tests   Abnormal serum level of alkaline phosphatase   Abnormal MRI of abdomen   Atrial fibrillation with RVR   He converted to NSR and has had intermittent sinus tachycardia. 2D echo showed EF estimated at 55 to 60%, moderately increased LVH.  Diastolic function could not be evaluated on echo. Continue metoprolol.  No anticoagulation for now since he is coagulopathic.  Follow-up with cardiologist as outpatient. 01/09/2020: Cardiology input is appreciated.  Follow with cardiology on discharge.  No anticoagulation for now.  Liver disease; coagulopathy, hypoalbuminemia, elevated liver enzymes ALP is trending down.  AST and ALT are normal.  - No abdominal pain or obstructive process on RUQ [place]  Hepatitis A, B and C were nonreactive.  MRCP abdomen showed numerous hepatic cysts and signs of hepatic and splenic iron deposition. Ferritin level is very high >7,500 but iron level is normal. GI recommends vitamin K for coagulopathy.  Monitor INR.  Follow-up with gastroenterologist. 01/09/2020: INR is 1.6 today.  We will continue to monitor close.  PT is 18.5.  AST and ALT are back to normal.  Albumin is 1.3.  Alkaline phosphatase is on downward trend (662, down from 792)  Abnormal heterogeneity of marrow spaces of the spine and pelvis suspicious for multiple myeloma Consulted oncologist, Dr. Burr Medico.  Work-up in progress. 01/09/2020: Oncology team does not think multiple myeloma is likely.  Work-up for hemochromatosis is in progress.  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  Oncology team input is  highly appreciated.    Acute kidney injury: Improved Creatinine has improved. 01/09/2020: Acute kidney injury has resolved.  Hypertension  - Continue metoprolol 01/09/2020: Blood pressures controlled.  Type 2 diabetes mellitus with  hyperglycemia  Hemoglobin A1c was 6.9. NovoLog as needed for hyperglycemia.  Acute urinary retention Continue Flomax and Foley catheter.  Family Communication/Anticipated D/C date and plan/Code Status   DVT prophylaxis: SCDs Code Status: Full code Family Communication: Plan discussed with Frank Daugherty Disposition Plan: Possible discharge to home in 2 to 3 days   Subjective:   No complaints.   No chest pain,  No shortness of breath  No palpitations  No fever or chills   Objective:    Vitals:   01/08/20 2119 01/09/20 0650 01/09/20 0840 01/09/20 1320  BP: (!) 109/56 118/84  127/72  Pulse: 88 90 91 68  Resp: '18 19  18  ' Temp: 98.6 F (37 C) 98.7 F (37.1 C) 98.6 F (37 C) 98.1 F (36.7 C)  TempSrc: Oral Oral Oral Oral  SpO2: 97% 98% 99% 98%  Weight:  69.9 kg      Intake/Output Summary (Last 24 hours) at 01/09/2020 1546 Last data filed at 01/09/2020 1033 Gross per 24 hour  Intake 720 ml  Output 1500 ml  Net -780 ml   Filed Weights   01/07/20 0500 01/08/20 0549 01/09/20 0650  Weight: 69.3 kg 70.1 kg 69.9 kg    Exam:  GEN: Frank Daugherty is not in any distress.   HEENT: Pallor.  No jaundice.   CV: S1-S2. PULM: CTA B ABD: soft, ND, NT, +BS CNS: Awake and alert.  Frank Daugherty moves all extremities.   EXT: No edema or tenderness  Data Reviewed:   I have personally reviewed following labs and imaging studies:  Labs: Labs show the following:   Basic Metabolic Panel: Recent Labs  Lab 01/05/20 1433 01/05/20 1433 01/06/20 0338 01/07/20 0953 01/07/20 1002  NA 143  --  142 147*  --   K 4.5   < > 4.3 3.8  --   CL 105  --  108 117*  --   CO2 20*  --  24 18*  --   GLUCOSE 244*  --  131* 139*  --   BUN 26*  --  21 20  --   CREATININE 1.43*  --  0.90 1.21  --   CALCIUM 8.4*  --  8.1* 7.9*  --   MG 2.6*  --   --   --  2.5*   < > = values in this interval not displayed.   GFR CrCl cannot be calculated (Unknown ideal weight.). Liver Function Tests: Recent Labs  Lab  01/05/20 1530 01/06/20 0338 01/07/20 0430 01/08/20 0509  AST 83* 65* 53* 31  ALT 49* 43 42 32  ALKPHOS 974* 882* 792* 662*  BILITOT 3.0* 1.5* 1.4* 1.2  PROT 6.5 5.6* 6.0* 5.2*  ALBUMIN 1.9* 1.8* 1.7* 1.6*   No results for input(s): LIPASE, AMYLASE in the last 168 hours. No results for input(s): AMMONIA in the last 168 hours. Coagulation profile Recent Labs  Lab 01/05/20 1433 01/06/20 0338 01/07/20 0953 01/08/20 1009 01/09/20 0354  INR 2.4* 2.0* 1.8* 1.6* 1.6*    CBC: Recent Labs  Lab 01/05/20 1433 01/06/20 0338 01/07/20 1002  WBC 6.2 5.1 6.0  NEUTROABS  --  2.2  --   HGB 11.6* 9.9* 9.6*  HCT 35.6* 29.6* 29.7*  MCV 81.1 79.4* 80.7  PLT 140* 127* 123*   Cardiac Enzymes: No  results for input(s): CKTOTAL, CKMB, CKMBINDEX, TROPONINI in the last 168 hours. BNP (last 3 results) No results for input(s): PROBNP in the last 8760 hours. CBG: Recent Labs  Lab 01/08/20 1118 01/08/20 1629 01/08/20 2117 01/09/20 0757 01/09/20 1152  GLUCAP 206* 104* 128* 118* 206*   D-Dimer: No results for input(s): DDIMER in the last 72 hours. Hgb A1c: No results for input(s): HGBA1C in the last 72 hours. Lipid Profile: No results for input(s): CHOL, HDL, LDLCALC, TRIG, CHOLHDL, LDLDIRECT in the last 72 hours. Thyroid function studies: No results for input(s): TSH, T4TOTAL, T3FREE, THYROIDAB in the last 72 hours.  Invalid input(s): FREET3 Anemia work up: Recent Labs    01/07/20 1002  FERRITIN >7,500*  TIBC 147*  IRON 115   Sepsis Labs: Recent Labs  Lab 01/05/20 1433 01/06/20 0338 01/07/20 1002  WBC 6.2 5.1 6.0    Microbiology Recent Results (from the past 240 hour(s))  Respiratory Panel by RT PCR (Flu A&B, Covid) - Nasopharyngeal Swab     Status: None   Collection Time: 01/05/20  2:43 PM   Specimen: Nasopharyngeal Swab  Result Value Ref Range Status   SARS Coronavirus 2 by RT PCR NEGATIVE NEGATIVE Final    Comment: (NOTE) SARS-CoV-2 target nucleic acids are NOT  DETECTED. The SARS-CoV-2 RNA is generally detectable in upper respiratoy specimens during the acute phase of infection. The lowest concentration of SARS-CoV-2 viral copies this assay can detect is 131 copies/mL. A negative result does not preclude SARS-Cov-2 infection and should not be used as the sole basis for treatment or other Frank Daugherty management decisions. A negative result may occur with  improper specimen collection/handling, submission of specimen other than nasopharyngeal swab, presence of viral mutation(s) within the areas targeted by this assay, and inadequate number of viral copies (<131 copies/mL). A negative result must be combined with clinical observations, Frank Daugherty history, and epidemiological information. The expected result is Negative. Fact Sheet for Patients:  PinkCheek.be Fact Sheet for Healthcare Providers:  GravelBags.it This test is not yet ap proved or cleared by the [place] FDA and  has been authorized for detection and/or diagnosis of SARS-CoV-2 by FDA under an Emergency Use Authorization (EUA). This EUA will remain  in effect (meaning this test can be used) for the duration of the COVID-19 declaration under Section 564(b)(1) of the Act, 21 U.S.C. section 360bbb-3(b)(1), unless the authorization is terminated or revoked sooner.    Influenza A by PCR NEGATIVE NEGATIVE Final   Influenza B by PCR NEGATIVE NEGATIVE Final    Comment: (NOTE) The Xpert Xpress SARS-CoV-2/FLU/RSV assay is intended as an aid in  the diagnosis of influenza from Nasopharyngeal swab specimens and  should not be used as a sole basis for treatment. Nasal washings and  aspirates are unacceptable for Xpert Xpress SARS-CoV-2/FLU/RSV  testing. Fact Sheet for Patients: PinkCheek.be Fact Sheet for Healthcare Providers: GravelBags.it This test is not yet approved or cleared  by the [place] FDA and  has been authorized for detection and/or diagnosis of SARS-CoV-2 by  FDA under an Emergency Use Authorization (EUA). This EUA will remain  in effect (meaning this test can be used) for the duration of the  Covid-19 declaration under Section 564(b)(1) of the Act, 21  U.S.C. section 360bbb-3(b)(1), unless the authorization is  terminated or revoked. Performed at [facility], [place] [address] [place], [place] [address]     Procedures and diagnostic studies:  No results found.  Medications:   . Chlorhexidine Gluconate Cloth  6 each Topical Daily  . [START ON 01/10/2020] influenza vaccine adjuvanted  0.5 mL Intramuscular Tomorrow-1000  . insulin aspart  0-5 Units Subcutaneous QHS  . insulin aspart  0-6 Units Subcutaneous TID WC  . metoprolol succinate  100 mg Oral Daily  . [START ON 01/10/2020] pneumococcal 23 valent vaccine  0.5 mL Intramuscular Tomorrow-1000  . sodium chloride flush  3 mL Intravenous Q12H  . tamsulosin  0.4 mg Oral QPC supper   Continuous Infusions:   LOS: 4 days   [facility]     01/09/2020, 3:46 PM

## 2020-01-10 LAB — CBC WITH DIFFERENTIAL/PLATELET
Abs Immature Granulocytes: 0.57 10*3/uL — ABNORMAL HIGH (ref 0.00–0.07)
Basophils Absolute: 0 10*3/uL (ref 0.0–0.1)
Basophils Relative: 0 %
Eosinophils Absolute: 0 10*3/uL (ref 0.0–0.5)
Eosinophils Relative: 1 %
HCT: 24 % — ABNORMAL LOW (ref 39.0–52.0)
Hemoglobin: 8.2 g/dL — ABNORMAL LOW (ref 13.0–17.0)
Immature Granulocytes: 11 %
Lymphocytes Relative: 40 %
Lymphs Abs: 2.1 10*3/uL (ref 0.7–4.0)
MCH: 26.4 pg (ref 26.0–34.0)
MCHC: 34.2 g/dL (ref 30.0–36.0)
MCV: 77.2 fL — ABNORMAL LOW (ref 80.0–100.0)
Monocytes Absolute: 0.3 10*3/uL (ref 0.1–1.0)
Monocytes Relative: 7 %
Neutro Abs: 2.2 10*3/uL (ref 1.7–7.7)
Neutrophils Relative %: 41 %
Platelets: 96 10*3/uL — ABNORMAL LOW (ref 150–400)
RBC: 3.11 MIL/uL — ABNORMAL LOW (ref 4.22–5.81)
RDW: 15.6 % — ABNORMAL HIGH (ref 11.5–15.5)
WBC: 5.3 10*3/uL (ref 4.0–10.5)
nRBC: 1.7 % — ABNORMAL HIGH (ref 0.0–0.2)

## 2020-01-10 LAB — HEPATIC FUNCTION PANEL
ALT: 29 U/L (ref 0–44)
AST: 34 U/L (ref 15–41)
Albumin: 1.3 g/dL — ABNORMAL LOW (ref 3.5–5.0)
Alkaline Phosphatase: 569 U/L — ABNORMAL HIGH (ref 38–126)
Bilirubin, Direct: 0.5 mg/dL — ABNORMAL HIGH (ref 0.0–0.2)
Indirect Bilirubin: 0.6 mg/dL (ref 0.3–0.9)
Total Bilirubin: 1.1 mg/dL (ref 0.3–1.2)
Total Protein: 4.7 g/dL — ABNORMAL LOW (ref 6.5–8.1)

## 2020-01-10 LAB — GLUCOSE, CAPILLARY
Glucose-Capillary: 186 mg/dL — ABNORMAL HIGH (ref 70–99)
Glucose-Capillary: 160 mg/dL — ABNORMAL HIGH (ref 70–99)
Glucose-Capillary: 114 mg/dL — ABNORMAL HIGH (ref 70–99)
Glucose-Capillary: 142 mg/dL — ABNORMAL HIGH (ref 70–99)

## 2020-01-10 LAB — RENAL FUNCTION PANEL
Albumin: 1.3 g/dL — ABNORMAL LOW (ref 3.5–5.0)
Anion gap: 9 (ref 5–15)
BUN: 9 mg/dL (ref 8–23)
CO2: 24 mmol/L (ref 22–32)
Calcium: 7.6 mg/dL — ABNORMAL LOW (ref 8.9–10.3)
Chloride: 105 mmol/L (ref 98–111)
Creatinine, Ser: 0.67 mg/dL (ref 0.61–1.24)
GFR calc Af Amer: >60 mL/min (ref >60)
GFR calc non Af Amer: >60 mL/min (ref >60)
Glucose, Bld: 135 mg/dL — ABNORMAL HIGH (ref 70–99)
Phosphorus: 2.7 mg/dL (ref 2.5–4.6)
Potassium: 3.4 mmol/L — ABNORMAL LOW (ref 3.5–5.1)
Sodium: 138 mmol/L (ref 135–145)

## 2020-01-10 LAB — PREALBUMIN: Prealbumin: 5.9 mg/dL — ABNORMAL LOW (ref 18–38)

## 2020-01-10 LAB — PROTEIN / CREATININE RATIO, URINE
Creatinine, Urine: 140.35 mg/dL
Protein Creatinine Ratio: 0.43 mg/mg{Cre} — ABNORMAL HIGH (ref 0.00–0.15)
Total Protein, Urine: 60 mg/dL

## 2020-01-10 LAB — PROTIME-INR
INR: 1.5 — ABNORMAL HIGH (ref 0.8–1.2)
Prothrombin Time: 18.2 seconds — ABNORMAL HIGH (ref 11.4–15.2)

## 2020-01-10 LAB — IRON AND TIBC
Iron: 89 ug/dL (ref 45–182)
Saturation Ratios: 72 % — ABNORMAL HIGH (ref 17.9–39.5)
TIBC: 123 ug/dL — ABNORMAL LOW (ref 250–450)
UIBC: 34 ug/dL

## 2020-01-10 LAB — FERRITIN: Ferritin: >7500 ng/mL — ABNORMAL HIGH (ref 24–336)

## 2020-01-10 LAB — MAGNESIUM: Magnesium: 1.8 mg/dL (ref 1.7–2.4)

## 2020-01-10 MED ORDER — MAGNESIUM SULFATE 2 GM/50ML IV SOLN
2.0000 g | Freq: Once | INTRAVENOUS | Status: AC
  Administered 2020-01-10: 2 g via INTRAVENOUS
  Filled 2020-01-10: qty 50

## 2020-01-10 MED ORDER — POTASSIUM CHLORIDE CRYS ER 20 MEQ PO TBCR
40.0000 meq | EXTENDED_RELEASE_TABLET | Freq: Once | ORAL | Status: AC
  Administered 2020-01-10: 40 meq via ORAL
  Filled 2020-01-10: qty 2

## 2020-01-10 MED ORDER — METOPROLOL SUCCINATE ER 25 MG PO TB24
125.0000 mg | ORAL_TABLET | Freq: Every day | ORAL | Status: DC
  Administered 2020-01-10: 125 mg via ORAL
  Filled 2020-01-10: qty 1

## 2020-01-10 MED ORDER — POTASSIUM CHLORIDE CRYS ER 20 MEQ PO TBCR: 40.0000 meq | EXTENDED_RELEASE_TABLET | ORAL | Status: DC

## 2020-01-10 NOTE — Progress Notes (Signed)
Progress Note   Subjective  Chief Complaint: Abnormal LFTs, new A. fib with RVR  This morning patient tells me that he is feeling well, no real changes.  No new complaints.   Objective   Vital signs in last 24 hours: Temp:  [98.1 F (36.7 C)-99 F (37.2 C)] 98.6 F (37 C) (01/31 0720) Pulse Rate:  [68-115] 111 (01/31 0720) Resp:  [16-18] 18 (01/31 0720) BP: (99-136)/(52-79) 121/68 (01/31 0720) SpO2:  [94 %-98 %] 95 % (01/31 0720) Weight:  [69.8 kg] 69.8 kg (01/31 0720) Last BM Date: 01/09/20 General:    AA male in NAD Heart:  Regular rate and rhythm; no murmurs Lungs: Respirations even and unlabored, lungs CTA bilaterally Abdomen:  Soft, nontender and nondistended. Normal bowel sounds. Extremities:  Without edema. Neurologic:  Alert and oriented,  grossly normal neurologically. Psych:  Cooperative. Normal mood and affect.  Intake/Output from previous day: 01/30 0701 - 01/31 0700 In: 720 [P.O.:720] Out: 1000 [Urine:1000]  Lab Results: Recent Labs    01/07/20 1002 01/10/20 0357  WBC 6.0 5.3  HGB 9.6* 8.2*  HCT 29.7* 24.0*  PLT 123* 96*   BMET Recent Labs    01/07/20 0953 01/10/20 0357  NA 147* 138  K 3.8 3.4*  CL 117* 105  CO2 18* 24  GLUCOSE 139* 135*  BUN 20 9  CREATININE 1.21 0.67  CALCIUM 7.9* 7.6*   Hepatic Function Latest Ref Rng & Units 01/10/2020 01/08/2020 01/07/2020  Total Protein 6.5 - 8.1 g/dL - 5.2(L) 6.0(L)  Albumin 3.5 - 5.0 g/dL 1.3(L) 1.6(L) 1.7(L)  AST 15 - 41 U/L - 31 53(H)  ALT 0 - 44 U/L - 32 42  Alk Phosphatase 38 - 126 U/L - 662(H) 792(H)  Total Bilirubin 0.3 - 1.2 mg/dL - 1.2 1.4(H)  Bilirubin, Direct 0.0 - 0.2 mg/dL - 0.4(H) 0.7(H)  PT/INR Recent Labs    01/09/20 0354 01/10/20 0357  LABPROT 18.5* 18.2*  INR 1.6* 1.5*      Assessment / Plan:   Assessment: 1.  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  New A. fib with RVR  Plan: 1.  INR is now normalizing 2.  Ordered repeat hepatic function panel today 3.  Hemochromatosis/multiple myeloma labs are ordered per hematology 4.  Please await any further recommendations from Dr. Tarri Glenn later today.  Dr. Rush Landmark will resume patient's care tomorrow.  Thank you for your kind consultation.   LOS: 5 days   Levin Erp  01/10/2020, 8:40 AM

## 2020-01-10 NOTE — Progress Notes (Signed)
   Dr Debbe Mounts rounding note reviewed. Tele review shows SR with PACs, short run of NSVT. No recurrent afib. 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 Soft bp's at times but overall stable bp's, with frequent ectopy increase toprol to 125mg  daily. Dr Marisue Ivan considering possible outpatient cardiac MRI. Getting vit K for underlying elevated INR, INR trending down. Is stable over time as outpatient can reconsider anticoag. With arrhythmias would keep K at 4 and Mg at 2, will write for 40 of KCl and 2g of Mag sulfate.   We will follow tele over the weekend.        Merrily Pew, MD  01/10/2020, 7:40 AM

## 2020-01-10 NOTE — Progress Notes (Signed)
Progress Note    Frank Daugherty  LNL:[id number] DOB: 1946-03-27  DOA: 01/05/2020 PCP: Patient, No Pcp Per   Brief Narrative:    Medical records reviewed and are as summarized below: Patient is a 74 year old African-American male with past medical history significant for hypertension.  Apparently, patient has not seen a physician in several years.  Patient was admitted following an episode of near syncope.  Patient said he was in his usual state of health until he developed acute lightheadedness and generalized weakness upon standing, feeling as though he might pass out, and improved some after sitting down.  On presentation to the ER, work-up done revealed slightly tachypnea, tachycardia to the 130 beats per minute, and with blood pressure 90/64.  EKG revealed atrial fibrillation with rate 125.  Chest x-ray was negative for acute cardiopulmonary disease.  Right upper quadrant ultrasound revealed slightly distended gallbladder with sludge but no wall thickening or Murphy sign to suggest acute gallbladder disease.  CBD dimensions are upper limit of normal on the ultrasound.  Chemistry panel concerning for alkaline phosphatase 974, albumin 1.9, AST 83, ALT 49, total bilirubin 3.0, and creatinine 1.43, all normal in 2013.  COVID-19 PCR is negative.  CBC with mild anemia and thrombocytopenia.  INR is elevated to 2.4.  Ferritin is elevated (greater than 7500).  There are also concerns for possible hemochromatosis.  Input from cardiology, GI and oncology team is appreciated.  Hematology team has ordered HFE gene mutation test.  Bone marrow aspiration biopsy is on hold as oncology/hematology team does not think multiple myeloma is likely.    01/10/2020: Patient seen.  No new complaints.  No syncopal episodes.  No palpitations.  No fever or chills.  Assessment/Plan:   Principal Problem:   Atrial fibrillation with RVR (HCC) Active Problems:   HTN (hypertension)   HLD (hyperlipidemia)   Renal  insufficiency   Coagulopathy (HCC)   Hyperglycemia   Abnormal liver function tests   Abnormal serum level of alkaline phosphatase   Abnormal MRI of abdomen   Atrial fibrillation with RVR   He converted to NSR and has had intermittent sinus tachycardia. 2D echo showed EF estimated at 55 to 60%, moderately increased LVH.  Diastolic function could not be evaluated on echo. Continue metoprolol.  No anticoagulation for now since he is coagulopathic.  Follow-up with cardiologist as outpatient. 01/10/2020: Cardiology input is appreciated.  Follow with cardiology on discharge.  No anticoagulation for now.  Liver disease; coagulopathy, hypoalbuminemia, elevated liver enzymes ALP is trending down.  AST and ALT are normal.  - No abdominal pain or obstructive process on RUQ [place]  Hepatitis A, B and C were nonreactive.  MRCP abdomen showed numerous hepatic cysts and signs of hepatic and splenic iron deposition. Ferritin level is very high >7,500 but iron level is normal. GI recommends vitamin K for coagulopathy.  Monitor INR.  Follow-up with gastroenterologist. 01/10/2020: INR is 1.5 today.  We will continue to monitor close.  PT is 18.5.  AST and ALT are back to normal.  Albumin is 1.3.  Alkaline phosphatase is on downward trend (662, down from 792)  Abnormal heterogeneity of marrow spaces of the spine and pelvis suspicious for multiple myeloma Consulted oncologist, Dr. Burr Medico.  Work-up in progress. 01/10/2020: Oncology team does not think multiple myeloma is likely.  Work-up for hemochromatosis is in progress.  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  Oncology team input is  highly appreciated.    Acute kidney injury: Improved Creatinine has improved. 01/10/2020: Acute kidney injury has resolved.  Hypertension  - Continue metoprolol 01/10/2020: Blood pressures controlled.  Type 2 diabetes mellitus with  hyperglycemia  Hemoglobin A1c was 6.9. NovoLog as needed for hyperglycemia.  Acute urinary retention Continue Flomax and Foley catheter.  Family Communication/Anticipated D/C date and plan/Code Status   DVT prophylaxis: SCDs Code Status: Full code Family Communication: Plan discussed with patient Disposition Plan: Possible discharge to home in 2 to 3 days   Subjective:  No chest pain,  No shortness of breath  No palpitations   Objective:    Vitals:   01/10/20 0720 01/10/20 0905 01/10/20 1232 01/10/20 1739  BP: 121/68 (!) 103/50 99/60 (!) 94/53  Pulse: (!) 111 (!) 106    Resp: '18 16 16 18  ' Temp: 98.6 F (37 C) 98.4 F (36.9 C) 98.8 F (37.1 C) 98.9 F (37.2 C)  TempSrc: Oral Oral Oral Oral  SpO2: 95% 95% 98% 96%  Weight: 69.8 kg       Intake/Output Summary (Last 24 hours) at 01/10/2020 1756 Last data filed at 01/10/2020 1633 Gross per 24 hour  Intake 1200 ml  Output 1000 ml  Net 200 ml   Filed Weights   01/08/20 0549 01/09/20 0650 01/10/20 0720  Weight: 70.1 kg 69.9 kg 69.8 kg    Exam: GEN: Patient is not in any distress.   HEENT: Pallor.  No jaundice.   CV: S1-S2. PULM: CTA B ABD: soft, ND, NT, +BS CNS: Awake and alert.  Patient moves all extremities.   EXT: Mild edema of the lower extremities  Data Reviewed:   I have personally reviewed following labs and imaging studies:  Labs: Labs show the following:   Basic Metabolic Panel: Recent Labs  Lab 01/05/20 1433 01/05/20 1433 01/06/20 0338 01/06/20 0338 01/07/20 0953 01/07/20 1002 01/10/20 0357  NA 143  --  142  --  147*  --  138  K 4.5   < > 4.3   < > 3.8  --  3.4*  CL 105  --  108  --  117*  --  105  CO2 20*  --  24  --  18*  --  24  GLUCOSE 244*  --  131*  --  139*  --  135*  BUN 26*  --  21  --  20  --  9  CREATININE 1.43*  --  0.90  --  1.21  --  0.67  CALCIUM 8.4*  --  8.1*  --  7.9*  --  7.6*  MG 2.6*  --   --   --   --  2.5* 1.8  PHOS  --   --   --   --   --   --  2.7   < > =  values in this interval not displayed.   GFR CrCl cannot be calculated (Unknown ideal weight.). Liver Function Tests: Recent Labs  Lab 01/05/20 1530 01/05/20 1530 01/06/20 0338 01/07/20 0430 01/08/20 0509 01/10/20 0357 01/10/20 0916  AST 83*  --  65* 53* 31  --  34  ALT 49*  --  43 42 32  --  29  ALKPHOS 974*  --  882* 792* 662*  --  569*  BILITOT 3.0*  --  1.5* 1.4* 1.2  --  1.1  PROT 6.5  --  5.6* 6.0* 5.2*  --  4.7*  ALBUMIN 1.9*   < > 1.8*  1.7* 1.6* 1.3* 1.3*   < > = values in this interval not displayed.   No results for input(s): LIPASE, AMYLASE in the last 168 hours. No results for input(s): AMMONIA in the last 168 hours. Coagulation profile Recent Labs  Lab 01/06/20 0338 01/07/20 0953 01/08/20 1009 01/09/20 0354 01/10/20 0357  INR 2.0* 1.8* 1.6* 1.6* 1.5*    CBC: Recent Labs  Lab 01/05/20 1433 01/06/20 0338 01/07/20 1002 01/10/20 0357  WBC 6.2 5.1 6.0 5.3  NEUTROABS  --  2.2  --  2.2  HGB 11.6* 9.9* 9.6* 8.2*  HCT 35.6* 29.6* 29.7* 24.0*  MCV 81.1 79.4* 80.7 77.2*  PLT 140* 127* 123* 96*   Cardiac Enzymes: No results for input(s): CKTOTAL, CKMB, CKMBINDEX, TROPONINI in the last 168 hours. BNP (last 3 results) No results for input(s): PROBNP in the last 8760 hours. CBG: Recent Labs  Lab 01/09/20 2114 01/09/20 2253 01/10/20 0803 01/10/20 1230 01/10/20 1629  GLUCAP 219* 236* 186* 160* 114*   D-Dimer: No results for input(s): DDIMER in the last 72 hours. Hgb A1c: No results for input(s): HGBA1C in the last 72 hours. Lipid Profile: No results for input(s): CHOL, HDL, LDLCALC, TRIG, CHOLHDL, LDLDIRECT in the last 72 hours. Thyroid function studies: No results for input(s): TSH, T4TOTAL, T3FREE, THYROIDAB in the last 72 hours.  Invalid input(s): FREET3 Anemia work up: Recent Labs    01/08/20 0509  FERRITIN >7,500*  TIBC 123*  IRON 89   Sepsis Labs: Recent Labs  Lab 01/05/20 1433 01/06/20 0338 01/07/20 1002 01/10/20 0357  WBC 6.2  5.1 6.0 5.3    Microbiology Recent Results (from the past 240 hour(s))  Respiratory Panel by RT PCR (Flu A&B, Covid) - Nasopharyngeal Swab     Status: None   Collection Time: 01/05/20  2:43 PM   Specimen: Nasopharyngeal Swab  Result Value Ref Range Status   SARS Coronavirus 2 by RT PCR NEGATIVE NEGATIVE Final    Comment: (NOTE) SARS-CoV-2 target nucleic acids are NOT DETECTED. The SARS-CoV-2 RNA is generally detectable in upper respiratoy specimens during the acute phase of infection. The lowest concentration of SARS-CoV-2 viral copies this assay can detect is 131 copies/mL. A negative result does not preclude SARS-Cov-2 infection and should not be used as the sole basis for treatment or other patient management decisions. A negative result may occur with  improper specimen collection/handling, submission of specimen other than nasopharyngeal swab, presence of viral mutation(s) within the areas targeted by this assay, and inadequate number of viral copies (<131 copies/mL). A negative result must be combined with clinical observations, patient history, and epidemiological information. The expected result is Negative. Fact Sheet for Patients:  PinkCheek.be Fact Sheet for Healthcare Providers:  GravelBags.it This test is not yet ap proved or cleared by the [place] FDA and  has been authorized for detection and/or diagnosis of SARS-CoV-2 by FDA under an Emergency Use Authorization (EUA). This EUA will remain  in effect (meaning this test can be used) for the duration of the COVID-19 declaration under Section 564(b)(1) of the Act, 21 U.S.C. section 360bbb-3(b)(1), unless the authorization is terminated or revoked sooner.    Influenza A by PCR NEGATIVE NEGATIVE Final   Influenza B by PCR NEGATIVE NEGATIVE Final    Comment: (NOTE) The Xpert Xpress SARS-CoV-2/FLU/RSV assay is intended as an aid in  the diagnosis of  influenza from Nasopharyngeal swab specimens and  should not be used as a sole basis for treatment. Nasal washings and  aspirates  are unacceptable for Xpert Xpress SARS-CoV-2/FLU/RSV  testing. Fact Sheet for Patients: PinkCheek.be Fact Sheet for Healthcare Providers: GravelBags.it This test is not yet approved or cleared by the [place] FDA and  has been authorized for detection and/or diagnosis of SARS-CoV-2 by  FDA under an Emergency Use Authorization (EUA). This EUA will remain  in effect (meaning this test can be used) for the duration of the  Covid-19 declaration under Section 564(b)(1) of the Act, 21  U.S.C. section 360bbb-3(b)(1), unless the authorization is  terminated or revoked. Performed at [facility], [place] [address] [place], [place] [address]     Procedures and diagnostic studies:  No results found.  Medications:   . Chlorhexidine Gluconate Cloth  6 each Topical Daily  . influenza vaccine adjuvanted  0.5 mL Intramuscular Tomorrow-1000  . insulin aspart  0-5 Units Subcutaneous QHS  . insulin aspart  0-6 Units Subcutaneous TID WC  . metoprolol succinate  125 mg Oral Daily  . pneumococcal 23 valent vaccine  0.5 mL Intramuscular Tomorrow-1000  . sodium chloride flush  3 mL Intravenous Q12H  . tamsulosin  0.4 mg Oral QPC supper   Continuous Infusions:   LOS: 5 days   [facility]  [facility]     01/10/2020, 5:56 PM

## 2020-01-10 NOTE — Progress Notes (Signed)
Pt had 11 beat run of run of v-tach at 3:08. Pt was asymptomatic. On call provider paged & notified. Will continue to monitor

## 2020-01-11 ENCOUNTER — Telehealth: Payer: Self-pay | Admitting: *Deleted

## 2020-01-11 ENCOUNTER — Other Ambulatory Visit: Payer: Self-pay | Admitting: Physician Assistant

## 2020-01-11 DIAGNOSIS — I480 Paroxysmal atrial fibrillation: Secondary | ICD-10-CM

## 2020-01-11 LAB — KAPPA/LAMBDA LIGHT CHAINS
Kappa free light chain: 15.7 mg/L (ref 3.3–19.4)
Kappa, lambda light chain ratio: 0.79 (ref 0.26–1.65)
Lambda free light chains: 19.9 mg/L (ref 5.7–26.3)

## 2020-01-11 LAB — COMPREHENSIVE METABOLIC PANEL
ALT: 33 U/L (ref 0–44)
AST: 42 U/L — ABNORMAL HIGH (ref 15–41)
Albumin: 1.5 g/dL — ABNORMAL LOW (ref 3.5–5.0)
Alkaline Phosphatase: 628 U/L — ABNORMAL HIGH (ref 38–126)
Anion gap: 10 (ref 5–15)
BUN: 11 mg/dL (ref 8–23)
CO2: 22 mmol/L (ref 22–32)
Calcium: 7.9 mg/dL — ABNORMAL LOW (ref 8.9–10.3)
Chloride: 103 mmol/L (ref 98–111)
Creatinine, Ser: 0.68 mg/dL (ref 0.61–1.24)
GFR calc Af Amer: >60 mL/min (ref >60)
GFR calc non Af Amer: >60 mL/min (ref >60)
Glucose, Bld: 155 mg/dL — ABNORMAL HIGH (ref 70–99)
Potassium: 4.7 mmol/L (ref 3.5–5.1)
Sodium: 135 mmol/L (ref 135–145)
Total Bilirubin: 1 mg/dL (ref 0.3–1.2)
Total Protein: 5.2 g/dL — ABNORMAL LOW (ref 6.5–8.1)

## 2020-01-11 LAB — PROTEIN ELECTROPHORESIS, SERUM
A/G Ratio: 0.6 — ABNORMAL LOW (ref 0.7–1.7)
Albumin ELP: 1.9 g/dL — ABNORMAL LOW (ref 2.9–4.4)
Alpha-1-Globulin: 0.5 g/dL — ABNORMAL HIGH (ref 0.0–0.4)
Alpha-2-Globulin: 1.3 g/dL — ABNORMAL HIGH (ref 0.4–1.0)
Beta Globulin: 1 g/dL (ref 0.7–1.3)
Gamma Globulin: 0.6 g/dL (ref 0.4–1.8)
Globulin, Total: 3.4 g/dL (ref 2.2–3.9)
Total Protein ELP: 5.3 g/dL — ABNORMAL LOW (ref 6.0–8.5)

## 2020-01-11 LAB — GLUCOSE, CAPILLARY
Glucose-Capillary: 148 mg/dL — ABNORMAL HIGH (ref 70–99)
Glucose-Capillary: 128 mg/dL — ABNORMAL HIGH (ref 70–99)
Glucose-Capillary: 146 mg/dL — ABNORMAL HIGH (ref 70–99)
Glucose-Capillary: 124 mg/dL — ABNORMAL HIGH (ref 70–99)

## 2020-01-11 LAB — MAGNESIUM: Magnesium: 1.8 mg/dL (ref 1.7–2.4)

## 2020-01-11 MED ORDER — BOOST / RESOURCE BREEZE PO LIQD CUSTOM
1.0000 | Freq: Three times a day (TID) | ORAL | Status: DC
  Administered 2020-01-12 – 2020-01-14 (×6): 1 via ORAL

## 2020-01-11 MED ORDER — METOPROLOL SUCCINATE ER 100 MG PO TB24
100.0000 mg | ORAL_TABLET | Freq: Every day | ORAL | Status: DC
  Administered 2020-01-11 – 2020-01-14 (×4): 100 mg via ORAL
  Filled 2020-01-11 (×4): qty 1

## 2020-01-11 NOTE — Progress Notes (Signed)
PROGRESS NOTE    Frank Daugherty  AOZ:[id number] DOB: [birth year] DOA: 01/05/2020 PCP: Patient, No Pcp Per   Brief Narrative: 74 year old with past medical history significant for hypertension, has not seen a physician in several years, admitted following an episode of near syncope.  Patient develop lightheadedness and generalized weakness upon standing, feeling as if he was going to pass out.  Evaluation in the ED revealed slight tachypnea, tachycardia heart rate in the 962X and systolic blood pressure 52/84.  EKG revealed A. fib RVR.  Chest x-ray was negative for acute cardiopulmonary disease.  Right upper quadrant ultrasound revealed a slightly distended gallbladder with sludge but no wall thickening or murphy  sign.  CBD dimension and upper limit of normal on the ultrasound.  Alkaline phosphatase 974, albumin 1.9, AST 83, ALT 49, bilirubin 3.0, creatinine 1.3.  Covid PCR negative.  Had mild anemia and thrombocytopenia.  INR was elevated at 2.4.  Ferritin elevated greater than 7500, there are also concern for possible hemochromatosis.  Cardiology, GI and oncology has been consulted and helping with patient care.  Hematology has ordered HFE gene mutation test.  Bone marrow aspiration biopsy has been on hold as oncology hematology team does not think multiple myeloma is likely.    Assessment & Plan:   Principal Problem:   Atrial fibrillation with RVR (HCC) Active Problems:   HTN (hypertension)   HLD (hyperlipidemia)   Renal insufficiency   Coagulopathy (HCC)   Hyperglycemia   Abnormal liver function tests   Abnormal serum level of alkaline phosphatase   Abnormal MRI of abdomen  1-A. fib with RVR: Converted to sinus rhythm Echocardiogram normal ejection fraction. Patient was a started on metoprolol, continue daily dose. No anticoagulation, because he is coagulopathic.  INR was elevated on admission. Cardiology assistance.  2-Liver disease, coagulopathy, hypoalbuminemia, elevated liver  enzymes: ALT is trending down.  AST and ALT are normal. Hepatitis A, B and C were negative. MRCP abdomen showed numerous hepatic cyst and signs of hepatic and splenic iron deposits. Routine level high more than 7500. GI recommended vitamin K for coagulopathy. 5 Nucleotidase normal, Alk Phosp source other than liver.  Imagine with no evidence of cirrhosis.   3-Abnormal heterogenicity of marrow space of the spine and pelvis suspicious for multiple myeloma: Dr. Burr Medico following. We will follow-up multiple myeloma and hemochromatosis  labs and she will arrange follow-up for patient  4-AKI: Resolved with IV fluids Urine retention: Started on Flomax.  Voiding trial today. Hypertension: Continue with metoprolol.  Type 2 diabetes with hyperglycemia: Hemoglobin A1c 6.9. SSI.   Nutrition Problem: Increased nutrient needs Etiology: acute illness    Signs/Symptoms: estimated needs    Interventions: Boost Breeze  Estimated body mass index is 22.78 kg/m as calculated from the following:   Height as of 04/23/12: 5' 9.75" (1.772 m).   Weight as of this encounter: 71.5 kg.   DVT prophylaxis: SCDs Code Status: Full code Family Communication: Discussed with patient Disposition Plan:  Patient is from: home Anticipated d/c date: Patient is stable for discharge awaiting a skilled nursing facility. Barriers to d/c or necessity for inpatient status: PT evaluation, patient requires skilled nursing facility  Consultants:   Cardiology  GI  Oncology    Procedures:  [place]; 1. Slightly distended gallbladder with sludge, but negative for wall thickness, sonographic Murphy, or other features to suggest acute gallbladder disease. Common duct diameter upper limits of normal 2. Slightly echogenic liver with multiple cysts    Antimicrobials:    Subjective: He  is feeling weak and tired.  Denies chest pain, shortness of breath, abdominal pain.  Objective: Vitals:   01/10/20 1739 01/10/20  2020 01/11/20 0635 01/11/20 0912  BP: (!) '94/53 99/65 96/65 '$ 118/86  Pulse:  97 74   Resp: 18     Temp: 98.9 F (37.2 C) 99.1 F (37.3 C) 98.7 F (37.1 C)   TempSrc: Oral Oral Oral   SpO2: 96% 91% 94%   Weight:   71.5 kg     Intake/Output Summary (Last 24 hours) at 01/11/2020 1606 Last data filed at 01/11/2020 1138 Gross per 24 hour  Intake 363 ml  Output 1100 ml  Net -737 ml   Filed Weights   01/09/20 0650 01/10/20 0720 01/11/20 0635  Weight: 69.9 kg 69.8 kg 71.5 kg    Examination:  General exam: Appears calm and comfortable  Respiratory system: Clear to auscultation. Respiratory effort normal. Cardiovascular system: S1 & S2 heard, RRR.  Gastrointestinal system: Abdomen is nondistended, soft and nontender. No organomegaly or masses felt. Normal bowel sounds heard. Central nervous system: Alert and oriented. . Extremities: Symmetric 5 x 5 power. Skin: No rashes, lesions or ulcers   Data Reviewed: I have personally reviewed following labs and imaging studies  CBC: Recent Labs  Lab 01/05/20 1433 01/06/20 0338 01/07/20 1002 01/10/20 0357  WBC 6.2 5.1 6.0 5.3  NEUTROABS  --  2.2  --  2.2  HGB 11.6* 9.9* 9.6* 8.2*  HCT 35.6* 29.6* 29.7* 24.0*  MCV 81.1 79.4* 80.7 77.2*  PLT 140* 127* 123* 96*   Basic Metabolic Panel: Recent Labs  Lab 01/05/20 1433 01/06/20 0338 01/07/20 0953 01/07/20 1002 01/10/20 0357 01/11/20 0356  NA 143 142 147*  --  138 135  K 4.5 4.3 3.8  --  3.4* 4.7  CL 105 108 117*  --  105 103  CO2 20* 24 18*  --  24 22  GLUCOSE 244* 131* 139*  --  135* 155*  BUN 26* 21 20  --  9 11  CREATININE 1.43* 0.90 1.21  --  0.67 0.68  CALCIUM 8.4* 8.1* 7.9*  --  7.6* 7.9*  MG 2.6*  --   --  2.5* 1.8 1.8  PHOS  --   --   --   --  2.7  --    GFR: CrCl cannot be calculated (Unknown ideal weight.). Liver Function Tests: Recent Labs  Lab 01/06/20 0338 01/06/20 0338 01/07/20 0430 01/08/20 0509 01/10/20 0357 01/10/20 0916 01/11/20 0356  AST 65*  --   53* 31  --  34 42*  ALT 43  --  42 32  --  29 33  ALKPHOS 882*  --  792* 662*  --  569* 628*  BILITOT 1.5*  --  1.4* 1.2  --  1.1 1.0  PROT 5.6*  --  6.0* 5.2*  --  4.7* 5.2*  ALBUMIN 1.8*   < > 1.7* 1.6* 1.3* 1.3* 1.5*   < > = values in this interval not displayed.   No results for input(s): LIPASE, AMYLASE in the last 168 hours. No results for input(s): AMMONIA in the last 168 hours. Coagulation Profile: Recent Labs  Lab 01/06/20 0338 01/07/20 0953 01/08/20 1009 01/09/20 0354 01/10/20 0357  INR 2.0* 1.8* 1.6* 1.6* 1.5*   Cardiac Enzymes: No results for input(s): CKTOTAL, CKMB, CKMBINDEX, TROPONINI in the last 168 hours. BNP (last 3 results) No results for input(s): PROBNP in the last 8760 hours. HbA1C: No results for input(s): HGBA1C in  the last 72 hours. CBG: Recent Labs  Lab 01/10/20 1230 01/10/20 1629 01/10/20 2123 01/11/20 0742 01/11/20 1116  GLUCAP 160* 114* 142* 148* 128*   Lipid Profile: No results for input(s): CHOL, HDL, LDLCALC, TRIG, CHOLHDL, LDLDIRECT in the last 72 hours. Thyroid Function Tests: No results for input(s): TSH, T4TOTAL, FREET4, T3FREE, THYROIDAB in the last 72 hours. Anemia Panel: No results for input(s): VITAMINB12, FOLATE, FERRITIN, TIBC, IRON, RETICCTPCT in the last 72 hours. Sepsis Labs: No results for input(s): PROCALCITON, LATICACIDVEN in the last 168 hours.  Recent Results (from the past 240 hour(s))  Respiratory Panel by RT PCR (Flu A&B, Covid) - Nasopharyngeal Swab     Status: None   Collection Time: 01/05/20  2:43 PM   Specimen: Nasopharyngeal Swab  Result Value Ref Range Status   SARS Coronavirus 2 by RT PCR NEGATIVE NEGATIVE Final    Comment: (NOTE) SARS-CoV-2 target nucleic acids are NOT DETECTED. The SARS-CoV-2 RNA is generally detectable in upper respiratoy specimens during the acute phase of infection. The lowest concentration of SARS-CoV-2 viral copies this assay can detect is 131 copies/mL. A negative result does  not preclude SARS-Cov-2 infection and should not be used as the sole basis for treatment or other patient management decisions. A negative result may occur with  improper specimen collection/handling, submission of specimen other than nasopharyngeal swab, presence of viral mutation(s) within the areas targeted by this assay, and inadequate number of viral copies (<131 copies/mL). A negative result must be combined with clinical observations, patient history, and epidemiological information. The expected result is Negative. Fact Sheet for Patients:  PinkCheek.be Fact Sheet for Healthcare Providers:  GravelBags.it This test is not yet ap proved or cleared by the [place] FDA and  has been authorized for detection and/or diagnosis of SARS-CoV-2 by FDA under an Emergency Use Authorization (EUA). This EUA will remain  in effect (meaning this test can be used) for the duration of the COVID-19 declaration under Section 564(b)(1) of the Act, 21 U.S.C. section 360bbb-3(b)(1), unless the authorization is terminated or revoked sooner.    Influenza A by PCR NEGATIVE NEGATIVE Final   Influenza B by PCR NEGATIVE NEGATIVE Final    Comment: (NOTE) The Xpert Xpress SARS-CoV-2/FLU/RSV assay is intended as an aid in  the diagnosis of influenza from Nasopharyngeal swab specimens and  should not be used as a sole basis for treatment. Nasal washings and  aspirates are unacceptable for Xpert Xpress SARS-CoV-2/FLU/RSV  testing. Fact Sheet for Patients: PinkCheek.be Fact Sheet for Healthcare Providers: GravelBags.it This test is not yet approved or cleared by the [place] FDA and  has been authorized for detection and/or diagnosis of SARS-CoV-2 by  FDA under an Emergency Use Authorization (EUA). This EUA will remain  in effect (meaning this test can be used) for the duration of the    Covid-19 declaration under Section 564(b)(1) of the Act, 21  U.S.C. section 360bbb-3(b)(1), unless the authorization is  terminated or revoked. Performed at [facility], [place] [address] [place], [place] [address]          Radiology Studies: No results found.      Scheduled Meds: . Chlorhexidine Gluconate Cloth  6 each Topical Daily  . feeding supplement  1 Container Oral TID BM  . influenza vaccine adjuvanted  0.5 mL Intramuscular Tomorrow-1000  . insulin aspart  0-5 Units Subcutaneous QHS  . insulin aspart  0-6 Units Subcutaneous TID WC  . metoprolol succinate  100 mg Oral Daily  .  pneumococcal 23 valent vaccine  0.5 mL Intramuscular Tomorrow-1000  . sodium chloride flush  3 mL Intravenous Q12H  . tamsulosin  0.4 mg Oral QPC supper   Continuous Infusions:   LOS: 6 days    Time spent: 35 minutes    Adrik Khim A Govani Radloff, MD [facility]   If 7PM-7AM, please contact night-coverage [email or web address]  01/11/2020, 4:06 PM

## 2020-01-11 NOTE — Evaluation (Signed)
Occupational Therapy Evaluation Patient Details Name: Frank Daugherty MRN: [id number] DOB: 12-24-1945 Today's Date: 01/11/2020    History of Present Illness 74yo male presenting after an episode of near syncope with light-headedness and weakness at home. Tachycardic and hypotensive in the ED. CXR negative for acute processes. Admitted due to A-fib with RVR, liver disease with coagulopathy. PMH HTN   Clinical Impression   This 74 yo male admitted with above presents to acute OT with decreased balance when up on his feet thus affecting his safety and independence with basic ADLs. Pt is normally totally independent with his basic ADLs, IADLs, and drives. He presents to today at a min A level when up on his feet. He will benefit from acute OT with follow up at SNF.     Follow Up Recommendations  SNF;Supervision/Assistance - 24 hour    Equipment Recommendations  Tub/shower seat       Precautions / Restrictions Precautions Precautions: Fall Precaution Comments: watch HR (went up during ambulation in hallway with PT, but not in room with OT) Restrictions Weight Bearing Restrictions: No      Mobility Bed Mobility Overal bed mobility: Needs Assistance Bed Mobility: Supine to Sit     Supine to sit: Supervision;HOB elevated      Transfers Overall transfer level: Needs assistance Equipment used: None Transfers: Sit to/from [facility] Sit to Stand: Min assist Stand pivot transfers: Min assist          Balance Overall balance assessment: Needs assistance Sitting-balance support: No upper extremity supported;Feet supported Sitting balance-Leahy Scale: Good Postural control: Posterior lean Standing balance support: No upper extremity supported Standing balance-Leahy Scale: Fair                           ADL either performed or assessed with clinical judgement   ADL Overall ADL's : Needs assistance/impaired Eating/Feeding: Independent;Sitting    Grooming: Set up;Sitting   Upper Body Bathing: Set up;Sitting   Lower Body Bathing: Minimal assistance;Sit to/from stand   Upper Body Dressing : Set up;Sitting   Lower Body Dressing: Minimal assistance;Sit to/from stand. Able to doff/don socks in recliner without LOB, but did have backrest of chair to rely on   Toilet Transfer: Minimal assistance;Stand-pivot Toilet Transfer Details (indicate cue type and reason): bed>recliner Toileting- Clothing Manipulation and Hygiene: Minimal assistance;Sit to/from stand               Vision Patient Visual Report: No change from baseline              Pertinent Vitals/Pain Pain Assessment: No/denies pain     Hand Dominance Right   Extremity/Trunk Assessment Upper Extremity Assessment Upper Extremity Assessment: Generalized weakness     Communication Communication Communication: No difficulties   Cognition Arousal/Alertness: Awake/alert Behavior During Therapy: Flat affect Overall Cognitive Status: No family/caregiver present to determine baseline cognitive functioning                                 General Comments: Followed all directions without hesitation              Home Living Family/patient expects to be discharged to:: Skilled nursing facility Living Arrangements: Alone Available Help at Discharge: (none per pt) Type of Home: House Home Access: Stairs to enter [employer] of Steps: 3-4 Entrance Stairs-Rails: Can reach both Home Layout: One level     Bathroom Shower/Tub:  Walk-in shower;Door   [employer] Toilet: Handicapped height     Home Equipment: Grab bars - tub/shower;Hand held shower head          Prior Functioning/Environment Level of Independence: Independent                 OT Problem List: Impaired balance (sitting and/or standing)      OT Treatment/Interventions: Self-care/ADL training;DME and/or AE instruction;Patient/family education;Balance training     OT Goals(Current goals can be found in the care plan section) Acute Rehab OT Goals Patient Stated Goal: to go home OT Goal Formulation: With patient Time For Goal Achievement: 01/25/20 Potential to Achieve Goals: Good  OT Frequency: Min 2X/week   Barriers to D/C: Decreased caregiver support             AM-PAC OT "6 Clicks" Daily Activity     Outcome Measure Help from another person eating meals?: None Help from another person taking care of personal grooming?: A Little Help from another person toileting, which includes using toliet, bedpan, or urinal?: A Little Help from another person bathing (including washing, rinsing, drying)?: A Little Help from another person to put on and taking off regular upper body clothing?: A Little Help from another person to put on and taking off regular lower body clothing?: A Little 6 Click Score: 19   End of Session Equipment Utilized During Treatment: Gait belt  Activity Tolerance: Patient tolerated treatment well Patient left: in chair;with call bell/phone within reach;with chair alarm set  OT Visit Diagnosis: Unsteadiness on feet (R26.81)                Time: [id number] OT Time Calculation (min): 26 min Charges:  OT General Charges $OT Visit: 1 Visit OT Evaluation $OT Eval Moderate Complexity: 1 Mod OT Treatments $Self Care/Home Management : 8-22 mins  Tye [place] , OTR/L [place] Pager [phone] Office [phone]    01/11/2020, 3:11 PM

## 2020-01-11 NOTE — Evaluation (Signed)
Physical Therapy Evaluation Patient Details Name: Frank Daugherty MRN: [id number] DOB: 11-12-1946 Today's Date: 01/11/2020   History of Present Illness  74yo male presenting after an episode of near syncope with light-headedness and weakness at home. Tachycardic and hypotensive in the ED. CXR negative for acute processes. Admitted due to A-fib with RVR, liver disease with coagulopathy. PMH HTN  Clinical Impression   Patient received in bed, very soft spoken and quiet but calm and participatory with PT today. See below for mobility/assist levels. Attempted gait without a device as this is his baseline, however he had severe balance and gait deviations requiring ModA to maintain safety and upright. Then attempted gait with RW, much improved although he did continue to require MinA for safety and balance, as well as for safe use of device; HR to 122 with gait approximately 175f in hallway. No chair was available in his room, so he was positioned to comfort in bed with all needs met, bed alarm active. Per his report he does not seem to have a lot of social support- feel that he would strong benefit from SNF prior to return home alone.     Follow Up Recommendations SNF;Supervision/Assistance - 24 hour    Equipment Recommendations  Rolling walker with 5" wheels;3in1 (PT)    Recommendations for Other Services       Precautions / Restrictions Precautions Precautions: Fall;Other (comment) Precaution Comments: watch HR Restrictions Weight Bearing Restrictions: No      Mobility  Bed Mobility Overal bed mobility: Needs Assistance Bed Mobility: Supine to Sit     Supine to sit: Min guard     General bed mobility comments: min guard, extended time/increased effort; able to put socks on while sitting at EOB but needed min guard due to posterior lean  Transfers Overall transfer level: Needs assistance Equipment used: Rolling walker (2 wheeled) Transfers: Sit to/from Stand Sit to Stand: Min  assist         General transfer comment: MinA to boost to full standing position and gain balance even with RW  Ambulation/Gait Ambulation/Gait assistance: Min assist;Mod assist Gait Distance (Feet): 95 Feet(13fwithout device; 8028fith RW) Assistive device: Rolling walker (2 wheeled);None Gait Pattern/deviations: Step-through pattern;Decreased step length - right;Decreased step length - left;Decreased stride length;Scissoring;Staggering left;Staggering right;Narrow base of support Gait velocity: decreased   General Gait Details: first attempted gait without device, required ModA due to scissoring gait pattern and poor balance, frequently reaching out for furniture in room; somewhat safer with RW but still needed MinA for balance and safety. HR to 122BPM with gait.  Stairs            Wheelchair Mobility    Modified Rankin (Stroke Patients Only)       Balance Overall balance assessment: Needs assistance Sitting-balance support: Feet supported Sitting balance-Leahy Scale: Fair Sitting balance - Comments: min guard for posterior lean when putting on socks Postural control: Posterior lean Standing balance support: No upper extremity supported;During functional activity Standing balance-Leahy Scale: Poor Standing balance comment: ModA to maintain upright without RW, MinA for balance and safety with RW                             Pertinent Vitals/Pain Pain Assessment: No/denies pain    Home Living Family/patient expects to be discharged to:: Private residence Living Arrangements: Alone   Type of Home: House Home Access: Stairs to enter Entrance Stairs-Rails: Can reach both Entrance Stairs-Number of Steps:  3-4 Home Layout: One level Home Equipment: None      Prior Function Level of Independence: Independent               Hand Dominance        Extremity/Trunk Assessment   Upper Extremity Assessment Upper Extremity Assessment: Generalized  weakness    Lower Extremity Assessment Lower Extremity Assessment: Generalized weakness    Cervical / Trunk Assessment Cervical / Trunk Assessment: Normal  Communication   Communication: No difficulties  Cognition Arousal/Alertness: Awake/alert Behavior During Therapy: Flat affect Overall Cognitive Status: No family/caregiver present to determine baseline cognitive functioning                                 General Comments: very soft spoken, but follows cues well. Able to acknowledge/identify that his current mobility is much different than his usual but otherwise seems to have reduced awareness of deficits. Seems to have some slow processing as well.      General Comments General comments (skin integrity, edema, etc.): HR to 122BPM with gait on RA    Exercises     Assessment/Plan    PT Assessment Patient needs continued PT services  PT Problem List Decreased strength;Decreased knowledge of use of DME;Decreased activity tolerance;Decreased safety awareness;Decreased balance;Decreased mobility;Decreased coordination       PT Treatment Interventions DME instruction;Balance training;Gait training;Stair training;Functional mobility training;Patient/family education;Therapeutic activities;Therapeutic exercise    PT Goals (Current goals can be found in the Care Plan section)  Acute Rehab PT Goals Patient Stated Goal: go home, get stronger- "i'll be alright" PT Goal Formulation: With patient Time For Goal Achievement: 01/25/20 Potential to Achieve Goals: Fair    Frequency Min 3X/week   Barriers to discharge        Co-evaluation               AM-PAC PT "6 Clicks" Mobility  Outcome Measure Help needed turning from your back to your side while in a flat bed without using bedrails?: A Little Help needed moving from lying on your back to sitting on the side of a flat bed without using bedrails?: A Little Help needed moving to and from a bed to a chair  (including a wheelchair)?: A Lot Help needed standing up from a chair using your arms (e.g., wheelchair or bedside chair)?: A Little Help needed to walk in hospital room?: A Lot Help needed climbing 3-5 steps with a railing? : A Lot 6 Click Score: 15    End of Session Equipment Utilized During Treatment: Gait belt Activity Tolerance: Patient tolerated treatment well Patient left: in bed;with call bell/phone within reach;with bed alarm set(no chair available in room) Nurse Communication: Mobility status;Other (comment)(HR with gait) PT Visit Diagnosis: Unsteadiness on feet (R26.81);Other abnormalities of gait and mobility (R26.89);Difficulty in walking, not elsewhere classified (R26.2);Muscle weakness (generalized) (M62.81)    Time: 5883-2549 PT Time Calculation (min) (ACUTE ONLY): 23 min   Charges:   PT Evaluation $PT Eval Low Complexity: 1 Low PT Treatments $Gait Training: 8-22 mins        Windell Norfolk, DPT, PN1   Supplemental Physical Therapist [place]    Pager [phone] Acute Rehab Office [phone]

## 2020-01-11 NOTE — Progress Notes (Signed)
Per d/w Dr. Sallyanne Kuster, request sent to schedulers to arrange 30 day monitor followed by appt with Dr. Audie Box at which time the timing of cMRI can be discussed. Office will call pt with this information. Dayna Dunn PA-C

## 2020-01-11 NOTE — Telephone Encounter (Signed)
Patient enrolled for [employer] to ship a 30 day cardiac event monitor to the patients home.

## 2020-01-11 NOTE — Progress Notes (Signed)
Initial Nutrition Assessment  INTERVENTION:   -Boost Breeze po TID, each supplement provides 250 kcal and 9 grams of protein -Calorie Count -Day 2 - Please document in flowsheets  NUTRITION DIAGNOSIS:   Increased nutrient needs related to acute illness as evidenced by estimated needs.  GOAL:   Patient will meet greater than or equal to 90% of their needs  MONITOR:   PO intake, Supplement acceptance, Weight trends, I & O's, Labs  REASON FOR ASSESSMENT:   Consult Calorie Count  ASSESSMENT:   74 year old African-American male with past medical history significant for hypertension.  Apparently, patient has not seen a physician in several years.  Patient was admitted following an episode of near syncope.  Patient said he was in his usual state of health until he developed acute lightheadedness and generalized weakness upon standing, feeling as though he might pass out, and improved some after sitting down.  1/26: admitted for a-fib  **RD working remotely**  48 hour Calorie Count ordered 1/31.  Day 1 results below.  1/31: B:395 kcals, 12g protein L:530 kcals, 20g protein D: not documented Supplements: not ordered Total: 925 kcals (54% of needs), 32g protein (42% of needs)  RD attempted to reach pt by phone to collect history and intakes but busy signal x 2.  Per chart review, since admission pt has been consuming ~0-100% of meals. Some days have better intakes than others. Will continue to monitor intakes. Will add Boost Breeze supplements for additional kcals and protein.  Admission weight: 152 lbs. Current weight: 157 lbs.  I/Os: -1.3L since admit UOP: 850 ml x 24 hrs  Medications reviewed. Labs reviewed: CBGs: 128-148  NUTRITION - FOCUSED PHYSICAL EXAM:  Working remotely.  Diet Order:   Diet Order            Diet Heart Room service appropriate? Yes; Fluid consistency: Thin  Diet effective now              EDUCATION NEEDS:   No education needs have  been identified at this time  Skin:  Skin Assessment: Reviewed RN Assessment  Last BM:  1/31  Height:   Ht Readings from Last 1 Encounters:  04/23/12 5' 9.75" (1.772 m)    Weight:   Wt Readings from Last 1 Encounters:  01/11/20 71.5 kg    Ideal Body Weight:  75.5 kg  BMI:  Body mass index is 22.78 kg/m.  Estimated Nutritional Needs:   Kcal:  1700-1900  Protein:  75-85g  Fluid:  1.8L/day  Clayton Bibles, MS, RD, LDN Inpatient Clinical Dietitian Pager: [phone] After Hours Pager: [phone]

## 2020-01-11 NOTE — Progress Notes (Signed)
Progress Note  Patient Name: Frank Daugherty Date of Encounter: 01/11/2020  Primary Cardiologist: Evalina Field, MD   Subjective   No CV complaints. Comfortable lying flat. Not particularly talkative. No recurrent arrhythmia, either atrial or ventricular. INR 1.5 yesterday, downward trend. Indwelling Foley catheter.   Inpatient Medications    Scheduled Meds: . Chlorhexidine Gluconate Cloth  6 each Topical Daily  . influenza vaccine adjuvanted  0.5 mL Intramuscular Tomorrow-1000  . insulin aspart  0-5 Units Subcutaneous QHS  . insulin aspart  0-6 Units Subcutaneous TID WC  . metoprolol succinate  125 mg Oral Daily  . pneumococcal 23 valent vaccine  0.5 mL Intramuscular Tomorrow-1000  . sodium chloride flush  3 mL Intravenous Q12H  . tamsulosin  0.4 mg Oral QPC supper   Continuous Infusions:  PRN Meds: diphenhydrAMINE, EPINEPHrine, ondansetron **OR** ondansetron (ZOFRAN) IV   Vital Signs    Vitals:   01/10/20 1232 01/10/20 1739 01/10/20 2020 01/11/20 0635  BP: 99/60 (!) 94/53 99/65 96/65   Pulse:   97 74  Resp: 16 18    Temp: 98.8 F (37.1 C) 98.9 F (37.2 C) 99.1 F (37.3 C) 98.7 F (37.1 C)  TempSrc: Oral Oral Oral Oral  SpO2: 98% 96% 91% 94%  Weight:    71.5 kg    Intake/Output Summary (Last 24 hours) at 01/11/2020 0840 Last data filed at 01/10/2020 2126 Gross per 24 hour  Intake 603 ml  Output 350 ml  Net 253 ml   Last 3 Weights 01/11/2020 01/10/2020 01/09/2020  Weight (lbs) 157 lb 10.1 oz 153 lb 14.1 oz 154 lb 1.6 oz  Weight (kg) 71.5 kg 69.8 kg 69.9 kg      Telemetry    NSR, PACs , occasional atrial couplets. NO atrial fibrillation is seen - Personally Reviewed  ECG    01/05/2020 1533h - sinus tach w PACs and artifact, NOT atrial fibrillation 01/05/2020 1348h - sinus rhythm w PACs and PVCs  - Personally Reviewed  Multiple tracings from EMS, many of technically poor quality, some sinus tachycardia with PACs, some concerning for AFib w RVR, at times  w aberrant conduction  Physical Exam  Lean, looks comfortable lying fully flat in bed GEN: No acute distress.   Neck: No JVD Cardiac: RRR, no murmurs, rubs, or gallops.  Respiratory: Clear to auscultation bilaterally. GI: Soft, nontender, non-distended  MS: No edema; No deformity. Neuro:  Nonfocal  Psych: Normal affect   Labs    High Sensitivity Troponin:  No results for input(s): TROPONINIHS in the last 720 hours.    Chemistry Recent Labs  Lab 01/07/20 0430 01/07/20 0953 01/08/20 0509 01/08/20 0509 01/10/20 0357 01/10/20 0916 01/11/20 0356  NA  --  147*  --   --  138  --  135  K  --  3.8  --   --  3.4*  --  4.7  CL  --  117*  --   --  105  --  103  CO2  --  18*  --   --  24  --  22  GLUCOSE  --  139*  --   --  135*  --  155*  BUN  --  20  --   --  9  --  11  CREATININE  --  1.21  --   --  0.67  --  0.68  CALCIUM  --  7.9*  --   --  7.6*  --  7.9*  PROT   < >  --  5.2*  --   --  4.7* 5.2*  ALBUMIN   < >  --  1.6*   < > 1.3* 1.3* 1.5*  AST   < >  --  31  --   --  34 42*  ALT   < >  --  32  --   --  29 33  ALKPHOS   < >  --  662*  --   --  569* 628*  BILITOT   < >  --  1.2  --   --  1.1 1.0  GFRNONAA  --  59*  --   --  >60  --  >60  GFRAA  --  >60  --   --  >60  --  >60  ANIONGAP  --  12  --   --  9  --  10   < > = values in this interval not displayed.     Hematology Recent Labs  Lab 01/06/20 0338 01/07/20 1002 01/10/20 0357  WBC 5.1 6.0 5.3  RBC 3.73* 3.68* 3.11*  HGB 9.9* 9.6* 8.2*  HCT 29.6* 29.7* 24.0*  MCV 79.4* 80.7 77.2*  MCH 26.5 26.1 26.4  MCHC 33.4 32.3 34.2  RDW 15.6* 15.9* 15.6*  PLT 127* 123* 96*    BNPNo results for input(s): BNP, PROBNP in the last 168 hours.   DDimer No results for input(s): DDIMER in the last 168 hours.   Radiology    No results found.  Cardiac Studies   ECHO 01/06/2020  1. Left ventricular ejection fraction, by visual estimation, is 55 to  60%. The left ventricle has normal function. There is mildly  increased  left ventricular hypertrophy.  2. Left ventricular diastolic function could not be evaluated.  3. The left ventricle has no regional wall motion abnormalities.  4. Global right ventricle has normal systolic function.The right  ventricular size is normal. No increase in right ventricular wall  thickness.  5. Left atrial size was normal.  6. Right atrial size was normal.  7. Mild mitral annular calcification.  8. The mitral valve is grossly normal. Mild mitral valve regurgitation.  9. The tricuspid valve is normal in structure.  10. The tricuspid valve is normal in structure. Tricuspid valve  regurgitation is trivial.  11. The aortic valve is tricuspid. Aortic valve regurgitation is not  visualized.  12. The pulmonic valve was not well visualized. Pulmonic valve  regurgitation is not visualized.  13. The inferior vena cava is normal in size with greater than 50%  respiratory variability, suggesting right atrial pressure of 3 mmHg.   Patient Profile     74 y.o. male presenting with new onset tachyarrhythmia, probably atrial fibrillation w RVR, mild LVH but otw normal echo (including normal LA size), evidence of liver disease including coagulopathy and severe iron overload, but also microcytic anemia.  Assessment & Plan    No clinical signs of HF. BP low and arrhythmia well controlled. Will reduce metoprolol to 100 mg daily, with increased convenience and better odds of compliance.  [facility] will sign off.   Medication Recommendations:  Continue current meds. No anticoagulation for now since he is still coagulopathic. Await Hematology recommendations as well. Other recommendations (labs, testing, etc):  Will arrange OP 30 day event monitor to see AFib burden and cardiac MRI to assess for Fe overload Follow up as an outpatient:  Will arrange F/U after 30 day monitor.  For questions or updates, please contact [place] Please  consult [email or web address] for  contact info under        Signed, Sanda Klein, MD  01/11/2020, 8:40 AM

## 2020-01-12 LAB — GLUCOSE, CAPILLARY
Glucose-Capillary: 120 mg/dL — ABNORMAL HIGH (ref 70–99)
Glucose-Capillary: 110 mg/dL — ABNORMAL HIGH (ref 70–99)
Glucose-Capillary: 168 mg/dL — ABNORMAL HIGH (ref 70–99)
Glucose-Capillary: 114 mg/dL — ABNORMAL HIGH (ref 70–99)

## 2020-01-12 LAB — CBC
HCT: 21.3 % — ABNORMAL LOW (ref 39.0–52.0)
Hemoglobin: 7.2 g/dL — ABNORMAL LOW (ref 13.0–17.0)
MCH: 26 pg (ref 26.0–34.0)
MCHC: 33.8 g/dL (ref 30.0–36.0)
MCV: 76.9 fL — ABNORMAL LOW (ref 80.0–100.0)
Platelets: 92 10*3/uL — ABNORMAL LOW (ref 150–400)
RBC: 2.77 MIL/uL — ABNORMAL LOW (ref 4.22–5.81)
RDW: 15.7 % — ABNORMAL HIGH (ref 11.5–15.5)
WBC: 6.5 10*3/uL (ref 4.0–10.5)
nRBC: 2.4 % — ABNORMAL HIGH (ref 0.0–0.2)

## 2020-01-12 LAB — ABO/RH: ABO/RH(D): B NEG

## 2020-01-12 LAB — PREPARE RBC (CROSSMATCH)

## 2020-01-12 LAB — HEMOCHROMATOSIS DNA-PCR(C282Y,H63D)

## 2020-01-12 MED ORDER — SODIUM CHLORIDE 0.9% IV SOLUTION: Freq: Once | INTRAVENOUS | Status: AC

## 2020-01-12 NOTE — Progress Notes (Addendum)
PROGRESS NOTE    Frank Daugherty  AST:[id number] DOB: May 27, 1946 DOA: 01/05/2020 PCP: Patient, No Pcp Per   Brief Narrative: 74 year old with past medical history significant for hypertension, has not seen a physician in several years, admitted following an episode of near syncope.  Patient develop lightheadedness and generalized weakness upon standing, feeling as if he was going to pass out.  Evaluation in the ED revealed slight tachypnea, tachycardia heart rate in the 989Q and systolic blood pressure 11/94.  EKG revealed A. fib RVR.  Chest x-ray was negative for acute cardiopulmonary disease.  Right upper quadrant ultrasound revealed a slightly distended gallbladder with sludge but no wall thickening or murphy  sign.  CBD dimension and upper limit of normal on the ultrasound.  Alkaline phosphatase 974, albumin 1.9, AST 83, ALT 49, bilirubin 3.0, creatinine 1.3.  Covid PCR negative.  Had mild anemia and thrombocytopenia.  INR was elevated at 2.4.  Ferritin elevated greater than 7500, there are also concern for possible hemochromatosis.  Cardiology, GI and oncology has been consulted and helping with patient care.  Hematology has ordered HFE gene mutation test.  Bone marrow aspiration biopsy has been on hold as oncology hematology team does not think multiple myeloma is likely.  Patient hb drop to 7. He denies melena, hematochezia. Plan to proceed with one unit PRBC>  He is currently awaiting SNF.   Assessment & Plan:   Principal Problem:   Atrial fibrillation with RVR (HCC) Active Problems:   HTN (hypertension)   HLD (hyperlipidemia)   Renal insufficiency   Coagulopathy (HCC)   Hyperglycemia   Abnormal liver function tests   Abnormal serum level of alkaline phosphatase   Abnormal MRI of abdomen  1-A. fib with RVR: Converted to sinus rhythm Echocardiogram normal ejection fraction. Patient was a started on metoprolol, continue daily dose. No anticoagulation, because he is coagulopathic.   INR was elevated on admission. Cardiology assistance.  2-Liver disease, coagulopathy, hypoalbuminemia, elevated liver enzymes: ALT is trending down.  AST and ALT are normal. Hepatitis A, B and C were negative. MRCP abdomen showed numerous hepatic cyst and signs of hepatic and splenic iron deposits. Routine level high more than 7500. GI recommended vitamin K for coagulopathy. 5 Nucleotidase normal, Alk Phosp source other than liver.  Imagine with no evidence of cirrhosis.  Labs stable.   3-Abnormal heterogenicity of marrow space of the spine and pelvis suspicious for multiple myeloma, anemia, thrombocytopenia.  -Dr Burr Medico will  follow-up multiple myeloma and hemochromatosis  labs and she will arrange follow-up for patient. -He will receive one unit PRBC today. Repeat hb in am.   4-AKI: Resolved with IV fluids Urine retention: Started on Flomax.   Fail voiding trial. Will place foley again 01-12-2020. He will need to be discharge with foley and needs follow up with urology.   Hypertension: Continue with metoprolol.  Type 2 diabetes with hyperglycemia: Hemoglobin A1c 6.9. SSI.   Nutrition Problem: Increased nutrient needs Etiology: acute illness    Signs/Symptoms: estimated needs    Interventions: Boost Breeze  Estimated body mass index is 22.72 kg/m as calculated from the following:   Height as of 04/23/12: 5' 9.75" (1.772 m).   Weight as of this encounter: 71.3 kg.   DVT prophylaxis: SCDs Code Status: Full code Family Communication: Discussed with patient, I updated ex-wife Frank Daugherty. She is aware patient has lab work pending for Hemochromatosis and MM. He will need to follow up with Oncologist. She wants to speak with SW/CM for disposition.  Disposition Plan:  Patient is from: [place] lives alone.  Anticipated d/c date: Patient is stable for discharge awaiting a skilled nursing facility. Blood transfusion today.  Barriers to d/c or necessity for inpatient status:  Blood transfusion today, awaiting SNF  Consultants:   Cardiology  GI  Oncology    Procedures:  [place]; 1. Slightly distended gallbladder with sludge, but negative for wall thickness, sonographic Murphy, or other features to suggest acute gallbladder disease. Common duct diameter upper limits of normal 2. Slightly echogenic liver with multiple cysts    Antimicrobials:    Subjective: He feels tired.  Fail voiding trial.  Denies melena, hematochezia.   Objective: Vitals:   01/11/20 0912 01/11/20 1649 01/11/20 2213 01/12/20 0707  BP: 118/86 93/61 (!) 100/58 110/77  Pulse:  97 97 96  Resp:      Temp:  98 F (36.7 C) 99.1 F (37.3 C) 98.7 F (37.1 C)  TempSrc:  Oral Oral Oral  SpO2:  95% 94% 98%  Weight:    71.3 kg    Intake/Output Summary (Last 24 hours) at 01/12/2020 1334 Last data filed at 01/12/2020 0230 Gross per 24 hour  Intake 3 ml  Output 550 ml  Net -547 ml   Filed Weights   01/10/20 0720 01/11/20 0635 01/12/20 0707  Weight: 69.8 kg 71.5 kg 71.3 kg    Examination:  General exam: NAD Respiratory system: CTA Cardiovascular system: S 1, S 2 RRR Gastrointestinal system: BS present, soft, nt  Central nervous system: Alert, follows command Extremities: Symmetric power Skin: no rashes   Data Reviewed: I have personally reviewed following labs and imaging studies  CBC: Recent Labs  Lab 01/05/20 1433 01/06/20 0338 01/07/20 1002 01/10/20 0357 01/12/20 0226  WBC 6.2 5.1 6.0 5.3 6.5  NEUTROABS  --  2.2  --  2.2  --   HGB 11.6* 9.9* 9.6* 8.2* 7.2*  HCT 35.6* 29.6* 29.7* 24.0* 21.3*  MCV 81.1 79.4* 80.7 77.2* 76.9*  PLT 140* 127* 123* 96* 92*   Basic Metabolic Panel: Recent Labs  Lab 01/05/20 1433 01/06/20 0338 01/07/20 0953 01/07/20 1002 01/10/20 0357 01/11/20 0356  NA 143 142 147*  --  138 135  K 4.5 4.3 3.8  --  3.4* 4.7  CL 105 108 117*  --  105 103  CO2 20* 24 18*  --  24 22  GLUCOSE 244* 131* 139*  --  135* 155*  BUN 26* 21 20  --  9  11  CREATININE 1.43* 0.90 1.21  --  0.67 0.68  CALCIUM 8.4* 8.1* 7.9*  --  7.6* 7.9*  MG 2.6*  --   --  2.5* 1.8 1.8  PHOS  --   --   --   --  2.7  --    GFR: CrCl cannot be calculated (Unknown ideal weight.). Liver Function Tests: Recent Labs  Lab 01/06/20 0338 01/06/20 0338 01/07/20 0430 01/08/20 0509 01/10/20 0357 01/10/20 0916 01/11/20 0356  AST 65*  --  53* 31  --  34 42*  ALT 43  --  42 32  --  29 33  ALKPHOS 882*  --  792* 662*  --  569* 628*  BILITOT 1.5*  --  1.4* 1.2  --  1.1 1.0  PROT 5.6*  --  6.0* 5.2*  --  4.7* 5.2*  ALBUMIN 1.8*   < > 1.7* 1.6* 1.3* 1.3* 1.5*   < > = values in this interval not displayed.   No results for  input(s): LIPASE, AMYLASE in the last 168 hours. No results for input(s): AMMONIA in the last 168 hours. Coagulation Profile: Recent Labs  Lab 01/06/20 0338 01/07/20 0953 01/08/20 1009 01/09/20 0354 01/10/20 0357  INR 2.0* 1.8* 1.6* 1.6* 1.5*   Cardiac Enzymes: No results for input(s): CKTOTAL, CKMB, CKMBINDEX, TROPONINI in the last 168 hours. BNP (last 3 results) No results for input(s): PROBNP in the last 8760 hours. HbA1C: No results for input(s): HGBA1C in the last 72 hours. CBG: Recent Labs  Lab 01/11/20 1116 01/11/20 1647 01/11/20 2214 01/12/20 0819 01/12/20 1144  GLUCAP 128* 146* 124* 120* 110*   Lipid Profile: No results for input(s): CHOL, HDL, LDLCALC, TRIG, CHOLHDL, LDLDIRECT in the last 72 hours. Thyroid Function Tests: No results for input(s): TSH, T4TOTAL, FREET4, T3FREE, THYROIDAB in the last 72 hours. Anemia Panel: No results for input(s): VITAMINB12, FOLATE, FERRITIN, TIBC, IRON, RETICCTPCT in the last 72 hours. Sepsis Labs: No results for input(s): PROCALCITON, LATICACIDVEN in the last 168 hours.  Recent Results (from the past 240 hour(s))  Respiratory Panel by RT PCR (Flu A&B, Covid) - Nasopharyngeal Swab     Status: None   Collection Time: 01/05/20  2:43 PM   Specimen: Nasopharyngeal Swab  Result  Value Ref Range Status   SARS Coronavirus 2 by RT PCR NEGATIVE NEGATIVE Final    Comment: (NOTE) SARS-CoV-2 target nucleic acids are NOT DETECTED. The SARS-CoV-2 RNA is generally detectable in upper respiratoy specimens during the acute phase of infection. The lowest concentration of SARS-CoV-2 viral copies this assay can detect is 131 copies/mL. A negative result does not preclude SARS-Cov-2 infection and should not be used as the sole basis for treatment or other patient management decisions. A negative result may occur with  improper specimen collection/handling, submission of specimen other than nasopharyngeal swab, presence of viral mutation(s) within the areas targeted by this assay, and inadequate number of viral copies (<131 copies/mL). A negative result must be combined with clinical observations, patient history, and epidemiological information. The expected result is Negative. Fact Sheet for Patients:  PinkCheek.be Fact Sheet for Healthcare Providers:  GravelBags.it This test is not yet ap proved or cleared by the [place] FDA and  has been authorized for detection and/or diagnosis of SARS-CoV-2 by FDA under an Emergency Use Authorization (EUA). This EUA will remain  in effect (meaning this test can be used) for the duration of the COVID-19 declaration under Section 564(b)(1) of the Act, 21 U.S.C. section 360bbb-3(b)(1), unless the authorization is terminated or revoked sooner.    Influenza A by PCR NEGATIVE NEGATIVE Final   Influenza B by PCR NEGATIVE NEGATIVE Final    Comment: (NOTE) The Xpert Xpress SARS-CoV-2/FLU/RSV assay is intended as an aid in  the diagnosis of influenza from Nasopharyngeal swab specimens and  should not be used as a sole basis for treatment. Nasal washings and  aspirates are unacceptable for Xpert Xpress SARS-CoV-2/FLU/RSV  testing. Fact Sheet for  Patients: PinkCheek.be Fact Sheet for Healthcare Providers: GravelBags.it This test is not yet approved or cleared by the [place] FDA and  has been authorized for detection and/or diagnosis of SARS-CoV-2 by  FDA under an Emergency Use Authorization (EUA). This EUA will remain  in effect (meaning this test can be used) for the duration of the  Covid-19 declaration under Section 564(b)(1) of the Act, 21  U.S.C. section 360bbb-3(b)(1), unless the authorization is  terminated or revoked. Performed at [facility], [place] [address] [place], [place] [address]  Radiology Studies: No results found.      Scheduled Meds: . sodium chloride   Intravenous Once  . Chlorhexidine Gluconate Cloth  6 each Topical Daily  . feeding supplement  1 Container Oral TID BM  . influenza vaccine adjuvanted  0.5 mL Intramuscular Tomorrow-1000  . insulin aspart  0-5 Units Subcutaneous QHS  . insulin aspart  0-6 Units Subcutaneous TID WC  . metoprolol succinate  100 mg Oral Daily  . pneumococcal 23 valent vaccine  0.5 mL Intramuscular Tomorrow-1000  . sodium chloride flush  3 mL Intravenous Q12H  . tamsulosin  0.4 mg Oral QPC supper   Continuous Infusions:   LOS: 7 days    Time spent: 35 minutes    Epiphany Seltzer A Braiden Presutti, MD [facility]   If 7PM-7AM, please contact night-coverage [email or web address]  01/12/2020, 1:34 PM

## 2020-01-12 NOTE — Progress Notes (Signed)
Nutrition Follow-up  INTERVENTION:   -Boost Breeze po TID, each supplement provides 250 kcal and 9 grams of protein -D/c Calorie Count  NUTRITION DIAGNOSIS:   Increased nutrient needs related to acute illness as evidenced by estimated needs.  Ongoing.  GOAL:   Patient will meet greater than or equal to 90% of their needs  Progressing.  MONITOR:   PO intake, Supplement acceptance, Weight trends, I & O's, Labs  ASSESSMENT:   74 year old African-American male with past medical history significant for hypertension.  Apparently, patient has not seen a physician in several years.  Patient was admitted following an episode of near syncope.  Patient said he was in his usual state of health until he developed acute lightheadedness and generalized weakness upon standing, feeling as though he might pass out, and improved some after sitting down.   1/26: admitted for a-fib  **RD working remotely**  48 hour Calorie Count ordered  Nothing documented for day 2.   Per secure chat with RN, pt has not been liking the hospital food. Has requested his ex-wife to bring him outside food to eat. No issues with appetite. Pt has not received insulin as result of not eating anything. Pt not drinking Boost Breeze but will leave order for now.   Admission weight: 152 lbs. Current weight: 157 lbs.  I/Os: -1.8L since admit UOP: 550 ml x 24 hrs  Medications reviewed.   Labs reviewed:  CBGs: 110-120  Diet Order:   Diet Order            Diet Heart Room service appropriate? Yes; Fluid consistency: Thin  Diet effective now              EDUCATION NEEDS:   No education needs have been identified at this time  Skin:  Skin Assessment: Reviewed RN Assessment  Last BM:  1/31  Height:   Ht Readings from Last 1 Encounters:  04/23/12 5' 9.75" (1.772 m)    Weight:   Wt Readings from Last 1 Encounters:  01/12/20 71.3 kg    Ideal Body Weight:  75.5 kg  BMI:  Body mass index is 22.72  kg/m.  Estimated Nutritional Needs:   Kcal:  1700-1900  Protein:  75-85g  Fluid:  1.8L/day  Clayton Bibles, MS, RD, LDN Inpatient Clinical Dietitian Pager: [phone] After Hours Pager: [phone]

## 2020-01-12 NOTE — TOC Initial Note (Signed)
Transition of Care [facility]) - Initial/Assessment Note    Patient Details  Name: Frank Daugherty MRN: [id number] Date of Birth: 31-Dec-1945  Transition of [facility]) CM/SW Contact:    Alberteen Sam, [place] Phone Number: [phone] 01/12/2020, 3:18 PM  Clinical Narrative:                  CSW spoke with patient regarding discharge plan and SNF recommendation. He at first was hesitant and stated he wanted to go back to his home in [place], however upon discussion of him living home alone and needing assistance patient realizes SNF is safest discharge plan for him at this time. Patient reports no facility preference and is in agreement with CSW faxing out referrals for bed offers.   CSW will update patient on bed offers once received.    Expected Discharge Plan: Skilled Nursing Facility Barriers to Discharge: Continued Medical Work up   Patient Goals and CMS Choice Patient states their goals for this hospitalization and ongoing recovery are:: to go to rehab then home CMS Medicare.gov Compare Post Acute Care list provided to:: Patient Choice offered to / list presented to : Patient  Expected Discharge Plan and Services Expected Discharge Plan: [place] Choice: [place] arrangements for the past 2 months: Single Family Home                                      Prior Living Arrangements/Services Living arrangements for the past 2 months: Single Family Home Lives with:: Self Patient language and need for interpreter reviewed:: Yes Do you feel safe going back to the place where you live?: No   needs short term rehab  Need for Family Participation in Patient Care: Yes (Comment) Care giver support system in place?: Yes (comment)   Criminal Activity/Legal Involvement Pertinent to Current Situation/Hospitalization: No - Comment as needed  Activities of Daily Living      Permission Sought/Granted Permission sought to  share information with : Case Manager, Customer service manager, Family Supports Permission granted to share information with : Yes, Verbal Permission Granted  Share Information with NAME: Chartered loss adjuster  Permission granted to share info w AGENCY: SNFs  Permission granted to share info w Relationship: son  Permission granted to share info w Contact Information: [phone]  Emotional Assessment Appearance:: Appears stated age Attitude/Demeanor/Rapport: Gracious Affect (typically observed): Calm Orientation: : Oriented to Self, Oriented to Place, Oriented to  Time, Oriented to Situation Alcohol / Substance Use: Not Applicable Psych Involvement: No (comment)  Admission diagnosis:  Transaminitis [R74.01] AKI (acute kidney injury) ([place]) [N17.9] Atrial fibrillation with RVR ([place]) [I48.91] Abdominal pain [R10.9] Patient Active Problem List   Diagnosis Date Noted  . Abnormal liver function tests   . Abnormal serum level of alkaline phosphatase   . Abnormal MRI of abdomen   . Atrial fibrillation with RVR ([place]) 01/05/2020  . Renal insufficiency 01/05/2020  . Coagulopathy ([place]) 01/05/2020  . Hyperglycemia 01/05/2020  . HTN (hypertension) 04/23/2012  . HLD (hyperlipidemia) 04/23/2012   PCP:  Patient, No Pcp Per Pharmacy:   [facility] [place], [place] [address] Phone: [phone] Fax: [phone]     Social Determinants of Health (SDOH) Interventions    Readmission Risk Interventions No  flowsheet data found.

## 2020-01-12 NOTE — Progress Notes (Signed)
Physical Therapy Treatment Patient Details Name: Frank Daugherty MRN: [id number] DOB: October 07, 1946 Today's Date: 01/12/2020    History of Present Illness 74yo male presenting after an episode of near syncope with light-headedness and weakness at home. Tachycardic and hypotensive in the ED. CXR negative for acute processes. Admitted due to A-fib with RVR, liver disease with coagulopathy. PMH HTN    PT Comments    Patient received in bed, pleasant and willing to participate in session; follows cue and commands, behavior is appropriate but he is very soft spoken and difficult to understand at times. See below for mobility/assist levels. Balance and gait much improved today however he does continue to benefit from min guard for safety due to ongoing narrow BOS and mild unsteadiness with turns even with RW. HR still going up to 121BPM with gait in hallway, gait distance limited due to this. Able to maintain static balance without BUE support while washing hands at sink but feel he would likely struggle with dynamic challenges or perturbations. He was left up in the chair with alarm active, all needs otherwise met this morning.    Follow Up Recommendations  SNF;Supervision/Assistance - 24 hour     Equipment Recommendations  Rolling walker with 5" wheels;3in1 (PT)    Recommendations for Other Services       Precautions / Restrictions Precautions Precautions: Fall Precaution Comments: watch HR (went up during ambulation in hallway with PT, but not in room with OT) Restrictions Weight Bearing Restrictions: No    Mobility  Bed Mobility Overal bed mobility: Needs Assistance Bed Mobility: Supine to Sit     Supine to sit: Supervision     General bed mobility comments: increased time and effort  Transfers Overall transfer level: Needs assistance Equipment used: Rolling walker (2 wheeled) Transfers: Sit to/from Stand Sit to Stand: Min assist         General transfer comment: MinA to  boost to full standing position and gain balance even with RW, cues for hand placement and safety  Ambulation/Gait Ambulation/Gait assistance: Min guard Gait Distance (Feet): 90 Feet Assistive device: Rolling walker (2 wheeled) Gait Pattern/deviations: Step-through pattern;Decreased step length - right;Decreased step length - left;Decreased stride length;Scissoring;Narrow base of support Gait velocity: decreased   General Gait Details: gait with RW improved today but continues to have narrow BOS as well as  mild unsteadiness with RW requiring ongoing Min guard for safety; less LOB L/R today but HR still up to 121BPM even with short distance gait   Stairs             Wheelchair Mobility    Modified Rankin (Stroke Patients Only)       Balance Overall balance assessment: Needs assistance Sitting-balance support: No upper extremity supported;Feet supported Sitting balance-Leahy Scale: Good     Standing balance support: No upper extremity supported Standing balance-Leahy Scale: Fair Standing balance comment: close min guard to walk in room without RW                            Cognition Arousal/Alertness: Awake/alert Behavior During Therapy: Flat affect Overall Cognitive Status: No family/caregiver present to determine baseline cognitive functioning                                 General Comments: follows cues and participates well, very soft spoken and hard to understand at times  Exercises      General Comments General comments (skin integrity, edema, etc.): HR to 121BPM with gait on RA      Pertinent Vitals/Pain Pain Assessment: No/denies pain    Home Living                      Prior Function            PT Goals (current goals can now be found in the care plan section) Acute Rehab PT Goals Patient Stated Goal: to go home PT Goal Formulation: With patient Time For Goal Achievement: 01/25/20 Potential to Achieve  Goals: Fair Progress towards PT goals: Progressing toward goals    Frequency    Min 3X/week      PT Plan Current plan remains appropriate    Co-evaluation              AM-PAC PT "6 Clicks" Mobility   Outcome Measure  Help needed turning from your back to your side while in a flat bed without using bedrails?: None Help needed moving from lying on your back to sitting on the side of a flat bed without using bedrails?: A Little Help needed moving to and from a bed to a chair (including a wheelchair)?: A Little Help needed standing up from a chair using your arms (e.g., wheelchair or bedside chair)?: A Little Help needed to walk in hospital room?: A Little Help needed climbing 3-5 steps with a railing? : A Lot 6 Click Score: 18    End of Session Equipment Utilized During Treatment: Gait belt Activity Tolerance: Patient tolerated treatment well Patient left: in chair;with call bell/phone within reach;with chair alarm set   PT Visit Diagnosis: Unsteadiness on feet (R26.81);Other abnormalities of gait and mobility (R26.89);Difficulty in walking, not elsewhere classified (R26.2);Muscle weakness (generalized) (M62.81)     Time: 0012-3935 PT Time Calculation (min) (ACUTE ONLY): 21 min  Charges:  $Gait Training: 8-22 mins                     Frank Daugherty, DPT, PN1   Supplemental Physical Therapist [place]    Pager [phone] Acute Rehab Office [phone]

## 2020-01-12 NOTE — Progress Notes (Signed)
Pt was asymptomatic with 14 beat run of v-tach at 1:14. Pt unable to urinate since d/c of foley catheter. Bladder scan showing 587 mL, order placed for I&O cath. On call provider paged & notified. Will continue to monitor.

## 2020-01-12 NOTE — Progress Notes (Signed)
Pt family wanting update on pt condition from MD. Frank Daugherty (exwife) is calling as representative of family. I asked pt if he was ok with the MD calling his exwife to give updates and he said yes. Messaged given to Dr. Tyrell Antonio. Carroll Kinds RN

## 2020-01-12 NOTE — Progress Notes (Signed)
Pt remains unable to void for 11 1/2 hours. Pt was retaining around 500cc. Dr. Tyrell Antonio notified and ordered to replace foley catheter and will keep when discharged from the hospital. Carroll Kinds RN

## 2020-01-12 NOTE — NC FL2 (Signed)
[place] LEVEL OF CARE SCREENING TOOL     IDENTIFICATION  Patient Name: Frank Daugherty Birthdate: [birth year] Sex: male Admission Date (Current Location): 01/05/2020  [facility] and [place] Number:  Herbalist and Address:  [facility] [place], [place] [address], [place], [place] [address]      Provider Number: [id number]  Attending Physician Name and Address:  Elmarie Shiley, MD  Relative Name and Phone Number:  Nyzier Remmick (wife)- [phone]    Current Level of Care: Hospital Recommended Level of Care: [place] Prior Approval Number:    Date Approved/Denied: 01/12/20 PASRR Number: [id number]  Discharge Plan: SNF    Current Diagnoses: Patient Active Problem List   Diagnosis Date Noted  . Abnormal liver function tests   . Abnormal serum level of alkaline phosphatase   . Abnormal MRI of abdomen   . Atrial fibrillation with RVR ([place]) 01/05/2020  . Renal insufficiency 01/05/2020  . Coagulopathy ([place]) 01/05/2020  . Hyperglycemia 01/05/2020  . HTN (hypertension) 04/23/2012  . HLD (hyperlipidemia) 04/23/2012    Orientation RESPIRATION BLADDER Height & Weight     Self, Time, Situation, Place  Normal Indwelling catheter(failed voiding trail foley replaced on 2/2-) Weight: 71.3 kg Height:     BEHAVIORAL SYMPTOMS/MOOD NEUROLOGICAL BOWEL NUTRITION STATUS      Continent Diet  AMBULATORY STATUS COMMUNICATION OF NEEDS Skin   Limited Assist Verbally Normal                       Personal Care Assistance Level of Assistance  Bathing, Dressing Bathing Assistance: Limited assistance   Dressing Assistance: Limited assistance     Functional Limitations Info  Hearing, Speech   Hearing Info: Adequate(HOH) Speech Info: Adequate(soft spoken)    SPECIAL CARE FACTORS FREQUENCY  PT (By licensed PT), OT (By licensed OT)     PT Frequency: 5x week OT Frequency: 5x week            Contractures Contractures  Info: Not present    Additional Factors Info                  Current Medications (01/12/2020):  This is the current hospital active medication list Current Facility-Administered Medications  Medication Dose Route Frequency Provider Last Rate Last Admin  . Chlorhexidine Gluconate Cloth 2 % PADS 6 each  6 each Topical Daily Jennye Boroughs, MD   6 each at 01/11/20 [phone]  . diphenhydrAMINE (BENADRYL) injection 25 mg  25 mg Intravenous Once PRN Jennye Boroughs, MD      . EPINEPHrine (EPI-PEN) injection 0.3 mg  0.3 mg Intramuscular Once PRN Jennye Boroughs, MD      . feeding supplement (BOOST / RESOURCE BREEZE) liquid 1 Container  1 Container Oral TID BM Regalado, Belkys A, MD      . influenza vaccine adjuvanted (FLUAD) injection 0.5 mL  0.5 mL Intramuscular Tomorrow-1000 Chambliss, Marshall L, MD      . insulin aspart (novoLOG) injection 0-5 Units  0-5 Units Subcutaneous QHS Vianne Bulls, MD   2 Units at 01/09/20 2301  . insulin aspart (novoLOG) injection 0-6 Units  0-6 Units Subcutaneous TID WC Opyd, Ilene Qua, MD   1 Units at 01/10/20 1307  . metoprolol succinate (TOPROL-XL) 24 hr tablet 100 mg  100 mg Oral Daily Croitoru, Mihai, MD   100 mg at 01/12/20 0939  . ondansetron (ZOFRAN) tablet 4 mg  4 mg Oral Q6H PRN Opyd, Ilene Qua,  MD       Or  . ondansetron (ZOFRAN) injection 4 mg  4 mg Intravenous Q6H PRN Opyd, Ilene Qua, MD      . pneumococcal 23 valent vaccine (PNEUMOVAX-23) injection 0.5 mL  0.5 mL Intramuscular Tomorrow-1000 Chambliss, Marshall L, MD      . sodium chloride flush (NS) 0.9 % injection 3 mL  3 mL Intravenous Q12H Opyd, Ilene Qua, MD   3 mL at 01/11/20 2100  . tamsulosin (FLOMAX) capsule 0.4 mg  0.4 mg Oral QPC supper Jennye Boroughs, MD   0.4 mg at 01/11/20 1727     Discharge Medications: Please see discharge summary for a list of discharge medications.  Relevant Imaging Results:  Relevant Lab Results:   Additional Information SSN#- [id number]  Dawayne Patricia, RN

## 2020-01-13 LAB — BPAM RBC
Blood Product Expiration Date: 202102132359
ISSUE DATE / TIME: 202102021502
Unit Type and Rh: 1700

## 2020-01-13 LAB — GLUCOSE, CAPILLARY
Glucose-Capillary: 130 mg/dL — ABNORMAL HIGH (ref 70–99)
Glucose-Capillary: 205 mg/dL — ABNORMAL HIGH (ref 70–99)
Glucose-Capillary: 103 mg/dL — ABNORMAL HIGH (ref 70–99)
Glucose-Capillary: 199 mg/dL — ABNORMAL HIGH (ref 70–99)

## 2020-01-13 LAB — BASIC METABOLIC PANEL
Anion gap: 9 (ref 5–15)
BUN: 11 mg/dL (ref 8–23)
CO2: 23 mmol/L (ref 22–32)
Calcium: 7.3 mg/dL — ABNORMAL LOW (ref 8.9–10.3)
Chloride: 104 mmol/L (ref 98–111)
Creatinine, Ser: 0.8 mg/dL (ref 0.61–1.24)
GFR calc Af Amer: >60 mL/min (ref >60)
GFR calc non Af Amer: >60 mL/min (ref >60)
Glucose, Bld: 221 mg/dL — ABNORMAL HIGH (ref 70–99)
Potassium: 4.2 mmol/L (ref 3.5–5.1)
Sodium: 136 mmol/L (ref 135–145)

## 2020-01-13 LAB — TYPE AND SCREEN
ABO/RH(D): B NEG
Antibody Screen: NEGATIVE
Unit division: 0

## 2020-01-13 LAB — CBC
HCT: 24.8 % — ABNORMAL LOW (ref 39.0–52.0)
Hemoglobin: 8.5 g/dL — ABNORMAL LOW (ref 13.0–17.0)
MCH: 27 pg (ref 26.0–34.0)
MCHC: 34.3 g/dL (ref 30.0–36.0)
MCV: 78.7 fL — ABNORMAL LOW (ref 80.0–100.0)
Platelets: 93 10*3/uL — ABNORMAL LOW (ref 150–400)
RBC: 3.15 MIL/uL — ABNORMAL LOW (ref 4.22–5.81)
RDW: 15.7 % — ABNORMAL HIGH (ref 11.5–15.5)
WBC: 6.4 10*3/uL (ref 4.0–10.5)
nRBC: 3.3 % — ABNORMAL HIGH (ref 0.0–0.2)

## 2020-01-13 LAB — SARS CORONAVIRUS 2 (TAT 6-24 HRS): SARS Coronavirus 2: NEGATIVE

## 2020-01-13 MED ORDER — TAMSULOSIN HCL 0.4 MG PO CAPS: 0.4000 mg | ORAL_CAPSULE | Freq: Every day | ORAL | 0 refills | Status: DC

## 2020-01-13 MED ORDER — METOPROLOL SUCCINATE ER 100 MG PO TB24: 100.0000 mg | ORAL_TABLET | Freq: Every day | ORAL | 0 refills | Status: DC

## 2020-01-13 MED ORDER — ACETAMINOPHEN 325 MG PO TABS
650.0000 mg | ORAL_TABLET | Freq: Four times a day (QID) | ORAL | Status: DC | PRN
  Administered 2020-01-14: 650 mg via ORAL
  Filled 2020-01-13: qty 2

## 2020-01-13 NOTE — Progress Notes (Signed)
RN offered PRN Tylenol, patient did not want any at this time.

## 2020-01-13 NOTE — Care Management Important Message (Signed)
Important Message  Patient Details  Name: Frank Daugherty MRN: [id number] Date of Birth: [birth year]   Medicare Important Message Given:  Yes     Shelda Altes 01/13/2020, 11:58 AM

## 2020-01-13 NOTE — Progress Notes (Signed)
Occupational Therapy Treatment Patient Details Name: Frank Daugherty MRN: [id number] DOB: 1946-10-21 Today's Date: 01/13/2020    History of present illness 74yo male presenting after an episode of near syncope with light-headedness and weakness at home. Tachycardic and hypotensive in the ED. CXR negative for acute processes. Admitted due to A-fib with RVR, liver disease with coagulopathy. PMH HTN   OT comments  Pt making progress with functional goals. Session focused on ADL and ADL mobility safety with ambulating to bathroom, toilet transfers, grooming at sink and LB ADLs. Pt required close min guard to walk in room without RW, reaching out for furniture and wall on way to the bathroom, LOB x 2 in bathroom, however pt was able to self correct using grab bar.Pt states that he does not agree thay he needs rehab after hospital stay, despite weakness, Poor balance and LOB x 2 while in bathroom with OT. OT will continue to follow acutely  Follow Up Recommendations  SNF;Supervision/Assistance - 24 hour    Equipment Recommendations  Tub/shower seat    Recommendations for Other Services      Precautions / Restrictions Precautions Precautions: Fall Precaution Comments: watch HR resting HR 108, increasing to 114 with in room activity Restrictions Weight Bearing Restrictions: No       Mobility Bed Mobility Overal bed mobility: Needs Assistance Bed Mobility: Supine to Sit     Supine to sit: Supervision     General bed mobility comments: increased time and effort  Transfers Overall transfer level: Needs assistance Equipment used: Rolling walker (2 wheeled) Transfers: Sit to/from Stand Sit to Stand: Min assist Stand pivot transfers: Min assist       General transfer comment: MinA to boost to full standing position and gain balance even with RW, cues for hand placement and safety    Balance Overall balance assessment: Needs assistance Sitting-balance support: No upper extremity  supported;Feet supported Sitting balance-Leahy Scale: Good   Postural control: Posterior lean Standing balance support: No upper extremity supported;During functional activity Standing balance-Leahy Scale: Fair Standing balance comment: close min guard to walk in room without RW, reaching out for furniture and wall on way to the bathroom, LOB x 2 in bathroom, however pt was able to self correct using grab bar.Pt states that he does not agree thay he needs rehab after hospital stay, despite weakness, Poor balance and LOB x 2 while in bathroom with OT                           ADL either performed or assessed with clinical judgement   ADL Overall ADL's : Needs assistance/impaired Eating/Feeding: Independent;Sitting   Grooming: Wash/dry hands;Wash/dry face;Min guard;Standing   Upper Body Bathing: Set up;Sitting Upper Body Bathing Details (indicate cue type and reason): simulated Lower Body Bathing: Minimal assistance;Sit to/from stand Lower Body Bathing Details (indicate cue type and reason): simulated, pt unsteady     Lower Body Dressing: Minimal assistance;Sit to/from stand Lower Body Dressing Details (indicate cue type and reason): pt unsteady Toilet Transfer: Min guard;Ambulation;Grab bars;Cueing for safety   Toileting- Clothing Manipulation and Hygiene: Min guard;Sit to/from stand       Functional mobility during ADLs: Min guard;Cueing for safety General ADL Comments: LOB x 2 while in bathroom with OT, able to self correct using grab bar     Vision Patient Visual Report: No change from baseline     Perception     Praxis      Cognition Arousal/Alertness:  Awake/alert Behavior During Therapy: Flat affect Overall Cognitive Status: No family/caregiver present to determine baseline cognitive functioning                                 General Comments: Pt states that he does not agree thay he needs rehab after hospital stay, despite weakness, Poor  balance and LOB x 2 while in bathroom with OT        Exercises     Shoulder Instructions       General Comments      Pertinent Vitals/ Pain       Pain Assessment: No/denies pain  Home Living                                          Prior Functioning/Environment              Frequency  Min 2X/week        Progress Toward Goals  OT Goals(current goals can now be found in the care plan section)  Progress towards OT goals: Progressing toward goals  Acute Rehab OT Goals Patient Stated Goal: to go home  Plan      Co-evaluation                 AM-PAC OT "6 Clicks" Daily Activity     Outcome Measure   Help from another person eating meals?: None Help from another person taking care of personal grooming?: A Little Help from another person toileting, which includes using toliet, bedpan, or urinal?: A Little Help from another person bathing (including washing, rinsing, drying)?: A Little Help from another person to put on and taking off regular upper body clothing?: None Help from another person to put on and taking off regular lower body clothing?: A Little 6 Click Score: 20    End of Session Equipment Utilized During Treatment: Gait belt  OT Visit Diagnosis: Unsteadiness on feet (R26.81)   Activity Tolerance Patient tolerated treatment well   Patient Left in chair;with call bell/phone within reach;with chair alarm set   Nurse Communication          Time: [id number] OT Time Calculation (min): 24 min  Charges: OT General Charges $OT Visit: 1 Visit OT Treatments $Therapeutic Activity: 8-22 mins     Britt Bottom 01/13/2020, 1:21 PM

## 2020-01-13 NOTE — Progress Notes (Signed)
Frank Daugherty   DOB:Dec 10, 1946   UY#:[id number]   DGL#:[id number]  Hem/onc follow up   Subjective: I saw pt today to discuss his lab results I ordered last week.  He is clinically stable, denies any significant pain, still feels fatigued, no other new complaints.   Objective:  Vitals:   01/13/20 0541 01/13/20 1508  BP: 116/70 115/76  Pulse: 95 89  Resp: 16 17  Temp: 99.8 F (37.7 C) 98.8 F (37.1 C)  SpO2: 97% 97%    Body mass index is 23.16 kg/m.  Intake/Output Summary (Last 24 hours) at 01/13/2020 2024 Last data filed at 01/13/2020 1836 Gross per 24 hour  Intake 720 ml  Output 1350 ml  Net -630 ml     Sclerae unicteric  Oropharynx clear  No peripheral adenopathy  Lungs clear -- no rales or rhonchi  Heart regular rate and rhythm  Abdomen benign    CBG (last 3)  Recent Labs    01/13/20 0807 01/13/20 1158 01/13/20 1626  GLUCAP 130* 205* 103*     Labs:  Urine Studies No results for input(s): UHGB, CRYS in the last 72 hours.  Invalid input(s): UACOL, UAPR, USPG, UPH, UTP, UGL, UKET, UBIL, UNIT, UROB, [place], UEPI, UWBC, Junie Panning [place], [place], [place]  Basic Metabolic Panel: Recent Labs  Lab 01/07/20 [phone] 01/07/20 0953 01/07/20 1002 01/10/20 0357 01/10/20 0357 01/11/20 0356 01/13/20 0404  NA 147*  --   --  138  --  135 136  K 3.8   < >  --  3.4*   < > 4.7 4.2  CL 117*  --   --  105  --  103 104  CO2 18*  --   --  24  --  22 23  GLUCOSE 139*  --   --  135*  --  155* 221*  BUN 20  --   --  9  --  11 11  CREATININE 1.21  --   --  0.67  --  0.68 0.80  CALCIUM 7.9*  --   --  7.6*  --  7.9* 7.3*  MG  --   --  2.5* 1.8  --  1.8  --   PHOS  --   --   --  2.7  --   --   --    < > = values in this interval not displayed.   GFR CrCl cannot be calculated (Unknown ideal weight.). Liver Function Tests: Recent Labs  Lab 01/07/20 0430 01/08/20 0509 01/10/20 0357 01/10/20 0916 01/11/20 0356  AST 53* 31  --  34 42*  ALT 42 32  --  29 33  ALKPHOS 792* 662*  --   569* 628*  BILITOT 1.4* 1.2  --  1.1 1.0  PROT 6.0* 5.2*  --  4.7* 5.2*  ALBUMIN 1.7* 1.6* 1.3* 1.3* 1.5*   No results for input(s): LIPASE, AMYLASE in the last 168 hours. No results for input(s): AMMONIA in the last 168 hours. Coagulation profile Recent Labs  Lab 01/07/20 0953 01/08/20 1009 01/09/20 0354 01/10/20 0357  INR 1.8* 1.6* 1.6* 1.5*    CBC: Recent Labs  Lab 01/07/20 1002 01/10/20 0357 01/12/20 0226 01/13/20 0404  WBC 6.0 5.3 6.5 6.4  NEUTROABS  --  2.2  --   --   HGB 9.6* 8.2* 7.2* 8.5*  HCT 29.7* 24.0* 21.3* 24.8*  MCV 80.7 77.2* 76.9* 78.7*  PLT 123* 96* 92* 93*   Cardiac Enzymes: No results for input(s): CKTOTAL,  CKMB, CKMBINDEX, TROPONINI in the last 168 hours. BNP: Invalid input(s): POCBNP CBG: Recent Labs  Lab 01/12/20 1609 01/12/20 2316 01/13/20 0807 01/13/20 1158 01/13/20 1626  GLUCAP 168* 114* 130* 205* 103*   D-Dimer No results for input(s): DDIMER in the last 72 hours. Hgb A1c No results for input(s): HGBA1C in the last 72 hours. Lipid Profile No results for input(s): CHOL, HDL, LDLCALC, TRIG, CHOLHDL, LDLDIRECT in the last 72 hours. Thyroid function studies No results for input(s): TSH, T4TOTAL, T3FREE, THYROIDAB in the last 72 hours.  Invalid input(s): FREET3 Anemia work up No results for input(s): VITAMINB12, FOLATE, FERRITIN, TIBC, IRON, RETICCTPCT in the last 72 hours. Microbiology Recent Results (from the past 240 hour(s))  Respiratory Panel by RT PCR (Flu A&B, Covid) - Nasopharyngeal Swab     Status: None   Collection Time: 01/05/20  2:43 PM   Specimen: Nasopharyngeal Swab  Result Value Ref Range Status   SARS Coronavirus 2 by RT PCR NEGATIVE NEGATIVE Final    Comment: (NOTE) SARS-CoV-2 target nucleic acids are NOT DETECTED. The SARS-CoV-2 RNA is generally detectable in upper respiratoy specimens during the acute phase of infection. The lowest concentration of SARS-CoV-2 viral copies this assay can detect is 131  copies/mL. A negative result does not preclude SARS-Cov-2 infection and should not be used as the sole basis for treatment or other patient management decisions. A negative result may occur with  improper specimen collection/handling, submission of specimen other than nasopharyngeal swab, presence of viral mutation(s) within the areas targeted by this assay, and inadequate number of viral copies (<131 copies/mL). A negative result must be combined with clinical observations, patient history, and epidemiological information. The expected result is Negative. Fact Sheet for Patients:  PinkCheek.be Fact Sheet for Healthcare Providers:  GravelBags.it This test is not yet ap proved or cleared by the [place] FDA and  has been authorized for detection and/or diagnosis of SARS-CoV-2 by FDA under an Emergency Use Authorization (EUA). This EUA will remain  in effect (meaning this test can be used) for the duration of the COVID-19 declaration under Section 564(b)(1) of the Act, 21 U.S.C. section 360bbb-3(b)(1), unless the authorization is terminated or revoked sooner.    Influenza A by PCR NEGATIVE NEGATIVE Final   Influenza B by PCR NEGATIVE NEGATIVE Final    Comment: (NOTE) The Xpert Xpress SARS-CoV-2/FLU/RSV assay is intended as an aid in  the diagnosis of influenza from Nasopharyngeal swab specimens and  should not be used as a sole basis for treatment. Nasal washings and  aspirates are unacceptable for Xpert Xpress SARS-CoV-2/FLU/RSV  testing. Fact Sheet for Patients: PinkCheek.be Fact Sheet for Healthcare Providers: GravelBags.it This test is not yet approved or cleared by the [place] FDA and  has been authorized for detection and/or diagnosis of SARS-CoV-2 by  FDA under an Emergency Use Authorization (EUA). This EUA will remain  in effect (meaning this test can  be used) for the duration of the  Covid-19 declaration under Section 564(b)(1) of the Act, 21  U.S.C. section 360bbb-3(b)(1), unless the authorization is  terminated or revoked. Performed at [facility], [place] [address] [place], [place] [address]   SARS CORONAVIRUS 2 (TAT 6-24 HRS) Nasopharyngeal Nasopharyngeal Swab     Status: None   Collection Time: 01/13/20  3:31 PM   Specimen: Nasopharyngeal Swab  Result Value Ref Range Status   SARS Coronavirus 2 NEGATIVE NEGATIVE Final    Comment: (NOTE) SARS-CoV-2 target nucleic acids are NOT DETECTED. The  SARS-CoV-2 RNA is generally detectable in upper and lower respiratory specimens during the acute phase of infection. Negative results do not preclude SARS-CoV-2 infection, do not rule out co-infections with other pathogens, and should not be used as the sole basis for treatment or other patient management decisions. Negative results must be combined with clinical observations, patient history, and epidemiological information. The expected result is Negative. Fact Sheet for Patients: SugarRoll.be Fact Sheet for Healthcare Providers: https://www.woods-mathews.com/ This test is not yet approved or cleared by the [place] FDA and  has been authorized for detection and/or diagnosis of SARS-CoV-2 by FDA under an Emergency Use Authorization (EUA). This EUA will remain  in effect (meaning this test can be used) for the duration of the COVID-19 declaration under Section 56 4(b)(1) of the Act, 21 U.S.C. section 360bbb-3(b)(1), unless the authorization is terminated or revoked sooner. Performed at [facility], [place] [address] [place],  [address]       Studies:  No results found.  Assessment: 74 y.o. male   1. Moderate anemia and mild thrombocytopenia  2. Abnormal bone marrow signals on MRI, indeterminate 3. 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  Abnormal LFTs and coagulopathy  5. AKI resolved  6. AF with RVR  7. HTN 8. DM  9. Deconditioning    Plan:  -I discussed the lab results that I ordered last week, including HFE gene mutation for hemochromatosis, SPEP, 24-hour UPEP, and serum light chain which were all unremarkable.  No evidence of heritable hemochromatosis or multiple myeloma -his anemia has gotten worse in the past week, required blood transfusion -Iron studies showed low serum iron, significantly elevated ferritin, consistent with anemia of chronic disease -I will check folate, B12 and reticulocyte count for tomorrow morning -I recommend a bone marrow biopsy, to rule out MDS, or other hematological disorders such as [place], .lymphoma etc. I  discussed with patient, he is agreeable   -We will put him n.p.o. after midnight, and call IR tomorrow morning to see if they able to biopsy tomorrow or Friday -I discussed with Dr. Renaee Munda. He discussed with pt early today about SNF placement and pt agreed. But when I talked to him about that, he states he rather to go home.  -OK to discharge after BM biopsy. Or if BM can not be done before discharge, then I will arrange it in our office.    Truitt Merle, MD 01/13/2020

## 2020-01-13 NOTE — Progress Notes (Signed)
Oral temperature this evening 100.3 degrees Farenheit.  No PRN Tylenol ordered.  Patient does have diagnosis of liver disease per MD note.  [facility] paged with this information.

## 2020-01-13 NOTE — TOC Progression Note (Addendum)
Transition of Care [facility]) - Progression Note    Patient Details  Name: Frank Daugherty MRN: [id number] Date of Birth: 12/18/1945  Transition of Care [facility]) CM/SW [place], [place] Phone Number: 01/13/2020, 3:23 PM  Clinical Narrative:     CSW met with patient and his son at bedside. CSW discussed bed offers and choices with patient and his son. Patient still remains hesitant about ST rehab at [facility]  but was agreeable  to [facility]. CSW explained the SNF process. CSW inquired if patient or his son had any questions or concerns that can help resolve some of their  Hesitation. Patient states no questions at this time.   CSW contacted [facility]. SNF confirmed bed offer and can accept patient tomorrow. CSW informed patient's son and he and the patient remains agreeable to ST rehab at [facility].  CSW updated RN and requested Covid test.  Thurmond Butts, MSW, [place] Clinical Social Worker     Expected Discharge Plan: Skilled Nursing Facility Barriers to Discharge: Continued Medical Work up  Expected Discharge Plan and Services Expected Discharge Plan: [place] Choice: [place] arrangements for the past 2 months: Single Family Home                                       Social Determinants of Health (SDOH) Interventions    Readmission Risk Interventions No flowsheet data found.

## 2020-01-13 NOTE — TOC Progression Note (Signed)
Transition of Care (TOC) - Progression Note  Marvetta Gibbons RN, BSN Transitions of Care Unit 4E- RN Case Manager (6E cross coverage) [phone]   Patient Details  Name: Bruin Paragas MRN: [id number] Date of Birth: 05/04/1946  Transition of Care [facility]) CM/SW Contact  Dahlia Client, Romeo Rabon, RN Phone Number: 01/13/2020, 2:37 PM  Clinical Narrative:    Spoke with pt and son at the bedside regarding transition plans- SNF bed offers provided- along with recommendations for rehab per PT/OT notes. Discussed SNF vs Home with HH. Answered questions for son. - will f/u this afternoon for choice of SNF bed after son and patient have had chance to discuss. Offered to call pt's ex-wife- Dauna- which son and pt declined at this time.    Expected Discharge Plan: [place] Barriers to Discharge: Continued Medical Work up  Expected Discharge Plan and Services Expected Discharge Plan: [place] Choice: [place] arrangements for the past 2 months: Single Family Home                                       Social Determinants of Health (SDOH) Interventions    Readmission Risk Interventions No flowsheet data found.

## 2020-01-13 NOTE — Progress Notes (Signed)
PROGRESS NOTE    Frank Daugherty  FAO:[id number] DOB: [birth year] DOA: 01/05/2020 PCP: Patient, No Pcp Per   Brief Narrative: 74 year old with past medical history significant for hypertension, has not seen a physician in several years, admitted following an episode of near syncope.  Patient develop lightheadedness and generalized weakness upon standing, feeling as if he was going to pass out.  Evaluation in the ED revealed slight tachypnea, tachycardia heart rate in the 696E and systolic blood pressure 95/28.  EKG revealed A. fib RVR.  Chest x-ray was negative for acute cardiopulmonary disease.  Right upper quadrant ultrasound revealed a slightly distended gallbladder with sludge but no wall thickening or murphy  sign.  CBD dimension and upper limit of normal on the ultrasound.  Alkaline phosphatase 974, albumin 1.9, AST 83, ALT 49, bilirubin 3.0, creatinine 1.3.  Covid PCR negative.  Had mild anemia and thrombocytopenia.  INR was elevated at 2.4.  Ferritin elevated greater than 7500, there are also concern for possible hemochromatosis.  Cardiology, GI and oncology has been consulted and helping with patient care.  Hematology has ordered HFE gene mutation test.  Bone marrow aspiration biopsy has been on hold as oncology hematology team does not think multiple myeloma is likely.  Patient hb drop to 7. He denies melena, hematochezia, got a unit of blood.  He is currently awaiting SNF.   Assessment & Plan:   Principal Problem:   Atrial fibrillation with RVR (HCC) Active Problems:   HTN (hypertension)   HLD (hyperlipidemia)   Renal insufficiency   Coagulopathy (HCC)   Hyperglycemia   Abnormal liver function tests   Abnormal serum level of alkaline phosphatase   Abnormal MRI of abdomen  1-A. fib with RVR: Converted to sinus rhythm Echocardiogram normal ejection fraction. Patient was a started on metoprolol, continue daily dose. No anticoagulation, because he is coagulopathic.   INR was  elevated on admission. Cardiology assistance appreciated.  2-Liver disease, coagulopathy, hypoalbuminemia, elevated liver enzymes: ALT is trending down.  AST and ALT are normal. Hepatitis A, B and C were negative. MRCP abdomen showed numerous hepatic cyst and signs of hepatic and splenic iron deposits. Routine level high more than 7500. GI recommended vitamin K for coagulopathy. 5 Nucleotidase normal, Alk Phosp source other than liver.  Imagine with no evidence of cirrhosis.  Labs stable.   3-Abnormal heterogenicity of marrow space of the spine and pelvis suspicious for multiple myeloma, anemia, thrombocytopenia.  -Dr Burr Medico will  follow-up multiple myeloma and hemochromatosis  labs and she will arrange follow-up for patient. -got 1 unit pRBC 2/2  4-AKI: Resolved with IV fluids Urine retention: Started on Flomax.   Fail voiding trial. Will place foley again 01-12-2020. He will need to be discharge with foley and needs follow up with urology.   Hypertension: Continue with metoprolol.  Type 2 diabetes with hyperglycemia: Hemoglobin A1c 6.9. SSI.  Nutrition Problem: Increased nutrient needs Etiology: acute illness  Signs/Symptoms: estimated needs  Interventions: Boost Breeze  Estimated body mass index is 23.16 kg/m as calculated from the following:   Height as of 04/23/12: 5' 9.75" (1.772 m).   Weight as of this encounter: 72.7 kg.  DVT prophylaxis: SCDs Code Status: Full code Family Communication: Discussed with patient, we updated ex-wife Hien Perreira 2/2. She is aware patient has lab work pending for Hemochromatosis and MM. He will need to follow up with Oncologist as well as Urology. She wants to speak with SW/CM for disposition.   Disposition Plan:  Patient is from:  [place] lives alone.  Anticipated d/c date: Patient is stable for discharge awaiting a skilled nursing facility. Barriers to d/c or necessity for inpatient status: Awaiting SNF  Consultants:    Cardiology  GI  Oncology    Procedures:  [place]; 1. Slightly distended gallbladder with sludge, but negative for wall thickness, sonographic Murphy, or other features to suggest acute gallbladder disease. Common duct diameter upper limits of normal 2. Slightly echogenic liver with multiple cysts    Antimicrobials:    Subjective: He feels tired.  OK with home with Foley.  Denies melena, hematochezia.  Wants to go home, but ok with SNF placement.  Objective: Vitals:   01/12/20 1842 01/12/20 2313 01/12/20 2315 01/13/20 0541  BP: (!) 128/53 110/77  116/70  Pulse:   97 95  Resp:   18 16  Temp: 98.4 F (36.9 C)  100.1 F (37.8 C) 99.8 F (37.7 C)  TempSrc: Oral  Oral Oral  SpO2: 97%  97% 97%  Weight:    72.7 kg    Intake/Output Summary (Last 24 hours) at 01/13/2020 1030 Last data filed at 01/13/2020 0630 Gross per 24 hour  Intake 955 ml  Output 1100 ml  Net -145 ml   Filed Weights   01/11/20 0635 01/12/20 0707 01/13/20 0541  Weight: 71.5 kg 71.3 kg 72.7 kg    Examination:  General exam: NAD, sitting up on edge of bed just got bathed. Respiratory system: CTA Cardiovascular system: S 1, S 2 RRR Gastrointestinal system: BS present, soft, nt  Central nervous system: Alert, follows command Extremities: Symmetric power Skin: no rashes   Data Reviewed: I have personally reviewed following labs and imaging studies  CBC: Recent Labs  Lab 01/07/20 1002 01/10/20 0357 01/12/20 0226 01/13/20 0404  WBC 6.0 5.3 6.5 6.4  NEUTROABS  --  2.2  --   --   HGB 9.6* 8.2* 7.2* 8.5*  HCT 29.7* 24.0* 21.3* 24.8*  MCV 80.7 77.2* 76.9* 78.7*  PLT 123* 96* 92* 93*   Basic Metabolic Panel: Recent Labs  Lab 01/07/20 0953 01/07/20 1002 01/10/20 0357 01/11/20 0356 01/13/20 0404  NA 147*  --  138 135 136  K 3.8  --  3.4* 4.7 4.2  CL 117*  --  105 103 104  CO2 18*  --  '24 22 23  ' GLUCOSE 139*  --  135* 155* 221*  BUN 20  --  '9 11 11  ' CREATININE 1.21  --  0.67 0.68 0.80   CALCIUM 7.9*  --  7.6* 7.9* 7.3*  MG  --  2.5* 1.8 1.8  --   PHOS  --   --  2.7  --   --    GFR: CrCl cannot be calculated (Unknown ideal weight.). Liver Function Tests: Recent Labs  Lab 01/07/20 0430 01/08/20 0509 01/10/20 0357 01/10/20 0916 01/11/20 0356  AST 53* 31  --  34 42*  ALT 42 32  --  29 33  ALKPHOS 792* 662*  --  569* 628*  BILITOT 1.4* 1.2  --  1.1 1.0  PROT 6.0* 5.2*  --  4.7* 5.2*  ALBUMIN 1.7* 1.6* 1.3* 1.3* 1.5*   No results for input(s): LIPASE, AMYLASE in the last 168 hours. No results for input(s): AMMONIA in the last 168 hours. Coagulation Profile: Recent Labs  Lab 01/07/20 0953 01/08/20 1009 01/09/20 0354 01/10/20 0357  INR 1.8* 1.6* 1.6* 1.5*   Cardiac Enzymes: No results for input(s): CKTOTAL, CKMB, CKMBINDEX, TROPONINI in the last 168 hours.  BNP (last 3 results) No results for input(s): PROBNP in the last 8760 hours. HbA1C: No results for input(s): HGBA1C in the last 72 hours. CBG: Recent Labs  Lab 01/12/20 0819 01/12/20 1144 01/12/20 1609 01/12/20 2316 01/13/20 0807  GLUCAP 120* 110* 168* 114* 130*   Lipid Profile: No results for input(s): CHOL, HDL, LDLCALC, TRIG, CHOLHDL, LDLDIRECT in the last 72 hours. Thyroid Function Tests: No results for input(s): TSH, T4TOTAL, FREET4, T3FREE, THYROIDAB in the last 72 hours. Anemia Panel: No results for input(s): VITAMINB12, FOLATE, FERRITIN, TIBC, IRON, RETICCTPCT in the last 72 hours. Sepsis Labs: No results for input(s): PROCALCITON, LATICACIDVEN in the last 168 hours.  Recent Results (from the past 240 hour(s))  Respiratory Panel by RT PCR (Flu A&B, Covid) - Nasopharyngeal Swab     Status: None   Collection Time: 01/05/20  2:43 PM   Specimen: Nasopharyngeal Swab  Result Value Ref Range Status   SARS Coronavirus 2 by RT PCR NEGATIVE NEGATIVE Final    Comment: (NOTE) SARS-CoV-2 target nucleic acids are NOT DETECTED. The SARS-CoV-2 RNA is generally detectable in upper  respiratoy specimens during the acute phase of infection. The lowest concentration of SARS-CoV-2 viral copies this assay can detect is 131 copies/mL. A negative result does not preclude SARS-Cov-2 infection and should not be used as the sole basis for treatment or other patient management decisions. A negative result may occur with  improper specimen collection/handling, submission of specimen other than nasopharyngeal swab, presence of viral mutation(s) within the areas targeted by this assay, and inadequate number of viral copies (<131 copies/mL). A negative result must be combined with clinical observations, patient history, and epidemiological information. The expected result is Negative. Fact Sheet for Patients:  PinkCheek.be Fact Sheet for Healthcare Providers:  GravelBags.it This test is not yet ap proved or cleared by the [place] FDA and  has been authorized for detection and/or diagnosis of SARS-CoV-2 by FDA under an Emergency Use Authorization (EUA). This EUA will remain  in effect (meaning this test can be used) for the duration of the COVID-19 declaration under Section 564(b)(1) of the Act, 21 U.S.C. section 360bbb-3(b)(1), unless the authorization is terminated or revoked sooner.    Influenza A by PCR NEGATIVE NEGATIVE Final   Influenza B by PCR NEGATIVE NEGATIVE Final    Comment: (NOTE) The Xpert Xpress SARS-CoV-2/FLU/RSV assay is intended as an aid in  the diagnosis of influenza from Nasopharyngeal swab specimens and  should not be used as a sole basis for treatment. Nasal washings and  aspirates are unacceptable for Xpert Xpress SARS-CoV-2/FLU/RSV  testing. Fact Sheet for Patients: PinkCheek.be Fact Sheet for Healthcare Providers: GravelBags.it This test is not yet approved or cleared by the [place] FDA and  has been authorized for  detection and/or diagnosis of SARS-CoV-2 by  FDA under an Emergency Use Authorization (EUA). This EUA will remain  in effect (meaning this test can be used) for the duration of the  Covid-19 declaration under Section 564(b)(1) of the Act, 21  U.S.C. section 360bbb-3(b)(1), unless the authorization is  terminated or revoked. Performed at [facility], [place] [address], [place], [place] [address]          Radiology Studies: No results found.      Scheduled Meds: . Chlorhexidine Gluconate Cloth  6 each Topical Daily  . feeding supplement  1 Container Oral TID BM  . influenza vaccine adjuvanted  0.5 mL Intramuscular Tomorrow-1000  . insulin aspart  0-5 Units Subcutaneous  QHS  . insulin aspart  0-6 Units Subcutaneous TID WC  . metoprolol succinate  100 mg Oral Daily  . pneumococcal 23 valent vaccine  0.5 mL Intramuscular Tomorrow-1000  . sodium chloride flush  3 mL Intravenous Q12H  . tamsulosin  0.4 mg Oral QPC supper   Continuous Infusions:   LOS: 8 days    Time spent: 21 minutes    Draden Cottingham Marry Guan, MD [facility]   If 7PM-7AM, please contact night-coverage [email or web address]  01/13/2020, 10:30 AM

## 2020-01-13 NOTE — Progress Notes (Deleted)
Dr. Christopher made aware trop 17.615. Dr. Yates aware of Hgb 6.0. Repeating hemoglobin. Ryland Smoots RN 

## 2020-01-14 ENCOUNTER — Inpatient Hospital Stay (HOSPITAL_COMMUNITY): Payer: Medicare Other

## 2020-01-14 ENCOUNTER — Telehealth: Payer: Self-pay | Admitting: Hematology

## 2020-01-14 LAB — UPEP/UIFE/LIGHT CHAINS/TP, 24-HR UR
% BETA, Urine: 32.2 %
ALPHA 1 URINE: 5.1 %
Albumin, U: 13.8 %
Alpha 2, Urine: 29.8 %
Free Kappa Lt Chains,Ur: 475.21 mg/L — ABNORMAL HIGH (ref 0.63–113.79)
Free Kappa/Lambda Ratio: 9.65 (ref 1.03–31.76)
Free Lambda Lt Chains,Ur: 49.26 mg/L — ABNORMAL HIGH (ref 0.47–11.77)
GAMMA GLOBULIN URINE: 19.2 %
Total Protein, Urine-Ur/day: 561 mg/24 hr — ABNORMAL HIGH (ref 30–150)
Total Protein, Urine: 56.1 mg/dL
Total Volume: 1000

## 2020-01-14 LAB — CBC WITH DIFFERENTIAL/PLATELET
Abs Immature Granulocytes: 0.49 10*3/uL — ABNORMAL HIGH (ref 0.00–0.07)
Basophils Absolute: 0 10*3/uL (ref 0.0–0.1)
Basophils Relative: 1 %
Eosinophils Absolute: 0 10*3/uL (ref 0.0–0.5)
Eosinophils Relative: 0 %
HCT: 25.2 % — ABNORMAL LOW (ref 39.0–52.0)
Hemoglobin: 8.5 g/dL — ABNORMAL LOW (ref 13.0–17.0)
Immature Granulocytes: 8 %
Lymphocytes Relative: 36 %
Lymphs Abs: 2.4 10*3/uL (ref 0.7–4.0)
MCH: 27.2 pg (ref 26.0–34.0)
MCHC: 33.7 g/dL (ref 30.0–36.0)
MCV: 80.5 fL (ref 80.0–100.0)
Monocytes Absolute: 0.3 10*3/uL (ref 0.1–1.0)
Monocytes Relative: 4 %
Neutro Abs: 3.3 10*3/uL (ref 1.7–7.7)
Neutrophils Relative %: 51 %
Platelets: 86 10*3/uL — ABNORMAL LOW (ref 150–400)
RBC: 3.13 MIL/uL — ABNORMAL LOW (ref 4.22–5.81)
RDW: 16.6 % — ABNORMAL HIGH (ref 11.5–15.5)
WBC: 6.5 10*3/uL (ref 4.0–10.5)
nRBC: 4 % — ABNORMAL HIGH (ref 0.0–0.2)

## 2020-01-14 LAB — RETICULOCYTES
Immature Retic Fract: 27.1 % — ABNORMAL HIGH (ref 2.3–15.9)
RBC.: 3.12 MIL/uL — ABNORMAL LOW (ref 4.22–5.81)
Retic Count, Absolute: 21.8 10*3/uL (ref 19.0–186.0)
Retic Ct Pct: 0.7 % (ref 0.4–3.1)

## 2020-01-14 LAB — GLUCOSE, CAPILLARY
Glucose-Capillary: 123 mg/dL — ABNORMAL HIGH (ref 70–99)
Glucose-Capillary: 226 mg/dL — ABNORMAL HIGH (ref 70–99)
Glucose-Capillary: 214 mg/dL — ABNORMAL HIGH (ref 70–99)

## 2020-01-14 LAB — ALKALINE PHOSPHATASE: Alkaline Phosphatase: 529 U/L — ABNORMAL HIGH (ref 38–126)

## 2020-01-14 LAB — VITAMIN B12: Vitamin B-12: 419 pg/mL (ref 180–914)

## 2020-01-14 MED ORDER — FENTANYL CITRATE (PF) 100 MCG/2ML IJ SOLN
INTRAMUSCULAR | Status: AC | PRN
  Administered 2020-01-14 (×2): 25 ug via INTRAVENOUS

## 2020-01-14 MED ORDER — FENTANYL CITRATE (PF) 100 MCG/2ML IJ SOLN
INTRAMUSCULAR | Status: AC
  Filled 2020-01-14: qty 4

## 2020-01-14 MED ORDER — MIDAZOLAM HCL 2 MG/2ML IJ SOLN
INTRAMUSCULAR | Status: AC | PRN
  Administered 2020-01-14: 0.5 mg via INTRAVENOUS

## 2020-01-14 MED ORDER — MIDAZOLAM HCL 2 MG/2ML IJ SOLN
INTRAMUSCULAR | Status: AC
  Filled 2020-01-14: qty 4

## 2020-01-14 NOTE — Telephone Encounter (Signed)
Received call from [place] [facility] to schedule f/u with MD. Scheduled and confirmed with caller.

## 2020-01-14 NOTE — Procedures (Signed)
Interventional Radiology Procedure:   Indications: Anemia and thrombocytopenia with abnormal bone marrow on imaging  Procedure: CT guided bone marrow biopsy  Findings: 1 aspirate and 2 cores from right ilium.  Minimal aspirate obtained.   Complications: None     EBL: Minimal, less than 10 ml  Plan: Return to inpatient room.   Frank Daugherty R. Anselm Pancoast, MD  Pager: [phone]

## 2020-01-14 NOTE — Discharge Summary (Addendum)
Discharge Summary  Frank Daugherty DUK:[id number] DOB: [birth year]  PCP: Patient, No Pcp Per  Admit date: 01/05/2020 Discharge date: 01/14/2020   Recommendations for Outpatient Follow-up:  1. Dr. Burr Medico in 2 weeks to discuss BM biopsy results  Discharge Diagnoses:  Active Hospital Problems   Diagnosis Date Noted  . Atrial fibrillation with RVR ([place]) 01/05/2020  . Abnormal liver function tests   . Abnormal serum level of alkaline phosphatase   . Abnormal MRI of abdomen   . Renal insufficiency 01/05/2020  . Coagulopathy ([place]) 01/05/2020  . Hyperglycemia 01/05/2020  . HTN (hypertension) 04/23/2012  . HLD (hyperlipidemia) 04/23/2012    Resolved Hospital Problems  No resolved problems to display.   Discharge Condition: Stable   Diet recommendation: Heart Healthy Diet  Vitals:   01/14/20 0505 01/14/20 0926  BP: 108/60 112/66  Pulse: 73 85  Resp:  (!) 22  Temp: 98.1 F (36.7 C)   SpO2: 98% 100%    HPI and Brief Hospital Course:  74 year old with past medical history significant for hypertension, has not seen a physician in several years, admitted following an episode of near syncope.  Patient develop lightheadedness and generalized weakness upon standing, feeling as if he was going to pass out.  Evaluation in the ED revealed slight tachypnea, tachycardia heart rate in the 376E and systolic blood pressure 83/15.  EKG revealed A. fib RVR.  Chest x-ray was negative for acute cardiopulmonary disease.  Right upper quadrant ultrasound revealed a slightly distended gallbladder with sludge but no wall thickening or murphy  sign.  CBD dimension and upper limit of normal on the ultrasound.  Alkaline phosphatase 974, albumin 1.9, AST 83, ALT 49, bilirubin 3.0, creatinine 1.3.  Covid PCR negative.  Had mild anemia and thrombocytopenia.  INR was elevated at 2.4.  Ferritin elevated greater than 7500, there are also concern for possible hemochromatosis.  Cardiology, GI and oncology has been consulted  and helping with patient care.  Hematology has ordered HFE gene mutation test.  Bone marrow aspiration biopsy has been on hold as oncology hematology team does not think multiple myeloma is likely.  On 2/1 patient had foley removed but was retaining so started Flomax and foley was replaced he will need outpatient Urology consultation for urinary retention in the next 2-3 weeks.  Patient hb drop to 7. He denies melena, hematochezia, got a unit of blood 2/2 and Hb stable. His testing ordered by hematology came back all normal so prior to DC today he underwent bone marrow biopsy and will follow up with Dr. Burr Medico of hematology as an outpatient to review results and discuss further management.  Discharge details, plan of care and follow up instructions were discussed with patient and his son on the phone today, was discussed with Dr. Burr Medico evening of 2/3 as well. Patient and family are in agreement with discharge from the hospital today and all questions were answered to their satisfaction.  Procedures:  Bone marrow biopsy 2/4   Consultations:  Hematology   Discharge Exam: BP 112/66 (BP Location: Right Arm)   Pulse 85   Temp 98.1 F (36.7 C) (Oral)   Resp (!) 22   Wt 72.1 kg   SpO2 100%   BMI 22.97 kg/m  General:  Alert, oriented, calm, in no acute distress  Eyes: EOMI, clear sclerea Neck: supple, no masses, trachea mildline  Cardiovascular: RRR, no murmurs or rubs, no peripheral edema  Respiratory: clear to auscultation bilaterally, no wheezes, no crackles  Abdomen: soft, nontender, nondistended,  normal bowel tones heard  Skin: dry, no rashes  Musculoskeletal: no joint effusions, normal range of motion  Psychiatric: appropriate affect, normal speech  Neurologic: extraocular muscles intact, clear speech, moving all extremities with intact sensorium   Discharge Instructions You were cared for by a hospitalist during your hospital stay. If you have any questions about your discharge  medications or the care you received while you were in the hospital after you are discharged, you can call the unit and asked to speak with the hospitalist on call if the hospitalist that took care of you is not available. Once you are discharged, your primary care physician will handle any further medical issues. Please note that NO REFILLS for any discharge medications will be authorized once you are discharged, as it is imperative that you return to your primary care physician (or establish a relationship with a primary care physician if you do not have one) for your aftercare needs so that they can reassess your need for medications and monitor your lab values.  Discharge Instructions    Amb referral to [facility]   Complete by: As directed    Diet - low sodium heart healthy   Complete by: As directed    Increase activity slowly   Complete by: As directed      Allergies as of 01/14/2020   No Active Allergies     Medication List    STOP taking these medications   OVER THE COUNTER MEDICATION   triamterene-hydrochlorothiazide 37.5-25 MG capsule Commonly known as: Dyazide     TAKE these medications   metoprolol succinate 100 MG 24 hr tablet Commonly known as: TOPROL-XL Take 1 tablet (100 mg total) by mouth daily. Take with or immediately following a meal.   pravastatin 40 MG tablet Commonly known as: PRAVACHOL Take 1 tablet (40 mg total) by mouth daily.   tamsulosin 0.4 MG Caps capsule Commonly known as: FLOMAX Take 1 capsule (0.4 mg total) by mouth daily after supper.      No Active Allergies Follow-up Information    O'Neal, Cassie Freer, MD Follow up.   Specialties: Internal Medicine, Cardiology, Radiology Why: [facility] - Dr. Kathalene Frames office will call you to arrange a heart monitor and follow-up appointment. Please call the office if you have not heard from [place] within 2 days of discharge. Contact information: [place]  [id number] [phone]            The results of significant diagnostics from this hospitalization (including imaging, microbiology, ancillary and laboratory) are listed below for reference.    Significant Diagnostic Studies: DG Chest Port 1 View  Result Date: 01/05/2020 CLINICAL DATA:  Weakness EXAM: PORTABLE CHEST 1 VIEW COMPARISON:  None. FINDINGS: The heart size and mediastinal contours are within normal limits. No focal airspace consolidation, pleural effusion, or pneumothorax. The visualized skeletal structures are unremarkable. IMPRESSION: No active disease. Electronically Signed   By: Davina Poke D.O.   On: 01/05/2020 14:15   MR ABDOMEN MRCP W WO CONTAST  Result Date: 01/06/2020 CLINICAL DATA:  Painless jaundice and weight loss. EXAM: MRI ABDOMEN WITHOUT AND WITH CONTRAST (INCLUDING MRCP) TECHNIQUE: Multiplanar multisequence MR imaging of the abdomen was performed both before and after the administration of intravenous contrast. Heavily T2-weighted images of the biliary and pancreatic ducts were obtained, and three-dimensional MRCP images were rendered by post processing. CONTRAST:  60m GADAVIST GADOBUTROL 1 MMOL/ML IV SOLN COMPARISON:  Ultrasound evaluation 01/05/2020 FINDINGS: Lower chest: Assessment of the lung  bases is limited on MR. No signs of consolidation or pleural effusion. Hepatobiliary: Numerous hepatic cysts, signs of hepatic iron deposition. Is. Sludge layering in the dependent gallbladder. No signs of filling defect in the biliary tree. Pancreas: Limited assessment due to respiratory motion. No signs of ductal dilation or focal lesion. Arterial phase imaging is showing reasonable quality. Spleen:  Normal size spleen with signs of splenic iron deposition. Adrenals/Urinary Tract: Normal adrenals. Symmetric enhancement of bilateral kidneys. No signs of hydronephrosis. Stomach/Bowel: Visualized gastrointestinal tract is unremarkable with very limited assessment due to  respiratory motion and without protocol for bowel assessment on MR. Vascular/Lymphatic: Patent abdominal vasculature. No signs of adenopathy. Other:  Trace ascites about the liver. Musculoskeletal: Very heterogeneous marrow signal with signs of iron deposition in the marrow spaces. Assessment on in and out of phase is limited. Scattered areas of relative restricted diffusion are noted though diffusion is limited in the setting of iron deposition. Heterogeneous patchy marrow space enhancement is noted throughout. IMPRESSION: 1. Marked heterogeneity of the visualized marrow spaces of the spine and pelvis. Areas of restricted diffusion and abnormal enhancement on a background of iron deposition. Correlate with any history of chronic anemia and exogenous iron administration or transfusions. Findings raise the question of process such as multiple myeloma, perhaps this would explain the elevated alkaline phosphatase which is more pronounced than other hepatic enzyme elevations. 2. Numerous hepatic cysts and signs of hepatic and splenic iron deposition. 3. Trace ascites about the liver. 4. No signs of biliary ductal dilation or filling defect. 5. Sludge layering in a mildly distended gallbladder. Electronically Signed   By: Zetta Bills M.D.   On: 01/06/2020 19:56   ECHOCARDIOGRAM COMPLETE  Result Date: 01/06/2020   ECHOCARDIOGRAM REPORT   Patient Name:   ZACKERIAH KISSLER Date of Exam: 01/06/2020 Medical Rec #:  [id number]     Height:       69.8 in Accession #:    [id number]    Weight:       194.5 lb Date of Birth:  06-10-1946    BSA:          2.06 m Patient Age:    58 years      BP:           112/70 mmHg Patient Gender: M             HR:           120 bpm. Exam Location:  Inpatient Procedure: 2D Echo Indications:    Atrial fibrillation  History:        Patient has no prior history of Echocardiogram examinations.                 Risk Factors:Hypertension and Dyslipidemia.  Sonographer:    Clayton Lefort RDCS (AE) Referring  Phys: [id number] [place]  1. Left ventricular ejection fraction, by visual estimation, is 55 to 60%. The left ventricle has normal function. There is mildly increased left ventricular hypertrophy.  2. Left ventricular diastolic function could not be evaluated.  3. The left ventricle has no regional wall motion abnormalities.  4. Global right ventricle has normal systolic function.The right ventricular size is normal. No increase in right ventricular wall thickness.  5. Left atrial size was normal.  6. Right atrial size was normal.  7. Mild mitral annular calcification.  8. The mitral valve is grossly normal. Mild mitral valve regurgitation.  9. The tricuspid valve is normal in structure. 10. The tricuspid valve  is normal in structure. Tricuspid valve regurgitation is trivial. 11. The aortic valve is tricuspid. Aortic valve regurgitation is not visualized. 12. The pulmonic valve was not well visualized. Pulmonic valve regurgitation is not visualized. 13. The inferior vena cava is normal in size with greater than 50% respiratory variability, suggesting right atrial pressure of 3 mmHg. FINDINGS  Left Ventricle: Left ventricular ejection fraction, by visual estimation, is 55 to 60%. The left ventricle has normal function. The left ventricle has no regional wall motion abnormalities. There is mildly increased left ventricular hypertrophy. Concentric left ventricular hypertrophy. The left ventricular diastology could not be evaluated due to nondiagnostic images. Left ventricular diastolic function could not be evaluated. Right Ventricle: The right ventricular size is normal. No increase in right ventricular wall thickness. Global RV systolic function is has normal systolic function. Left Atrium: Left atrial size was normal in size. Right Atrium: Right atrial size was normal in size Pericardium: There is no evidence of pericardial effusion. Mitral Valve: The mitral valve is grossly normal. There is mild  thickening of the mitral valve leaflet(s). There is mild calcification of the mitral valve leaflet(s). Mild mitral annular calcification. Mild mitral valve regurgitation. Tricuspid Valve: The tricuspid valve is normal in structure. Tricuspid valve regurgitation is trivial. Aortic Valve: The aortic valve is tricuspid. . There is mild thickening and mild calcification of the aortic valve. Aortic valve regurgitation is not visualized. There is mild thickening of the aortic valve. There is mild calcification of the aortic valve. Pulmonic Valve: The pulmonic valve was not well visualized. Pulmonic valve regurgitation is not visualized. Pulmonic regurgitation is not visualized. Aorta: The aortic root, ascending aorta and aortic arch are all structurally normal, with no evidence of dilitation or obstruction. Pulmonary Artery: The pulmonary artery is not well seen. Venous: The inferior vena cava is normal in size with greater than 50% respiratory variability, suggesting right atrial pressure of 3 mmHg. IAS/Shunts: No atrial level shunt detected by color flow Doppler.  LEFT VENTRICLE PLAX 2D LVIDd:         4.70 cm LVIDs:         3.30 cm LV PW:         1.25 cm LV IVS:        1.40 cm LVOT diam:     2.00 cm LV SV:         58 ml LV SV Index:   27.70 LVOT Area:     3.14 cm  RIGHT VENTRICLE             IVC RV Basal diam:  3.10 cm     IVC diam: 1.20 cm RV S prime:     15.70 cm/s TAPSE (M-mode): 2.3 cm LEFT ATRIUM           Index       RIGHT ATRIUM           Index LA diam:      3.10 cm 1.51 cm/m  RA Area:     15.80 cm LA Vol (A2C): 42.8 ml 20.80 ml/m RA Volume:   40.20 ml  19.54 ml/m LA Vol (A4C): 53.2 ml 25.85 ml/m  AORTIC VALVE LVOT Vmax:   125.00 cm/s LVOT Vmean:  80.740 cm/s LVOT VTI:    0.227 m  AORTA Ao Root diam: 3.30 cm Ao Asc diam:  3.10 cm  SHUNTS Systemic VTI:  0.23 m Systemic Diam: 2.00 cm  Buford Dresser MD Electronically signed by Buford Dresser MD Signature Date/Time: 01/06/2020/1:14:41 PM  Final     US Abdomen Limited RUQ  Result Date: 01/05/2020 CLINICAL DATA:  Abdominal pain for 2 weeks EXAM: ULTRASOUND ABDOMEN LIMITED RIGHT UPPER QUADRANT COMPARISON:  None. Chest x-ray 01/05/2020 FINDINGS: Gallbladder: Slightly distended. Moderate sludge in the gallbladder. No shadowing stones. Normal wall thickness. Negative sonographic Murphy Common bile duct: Diameter: 6 mm Liver: Liver is slightly echogenic. Multiple cysts within the liver. The largest is seen in the posterior right hepatic lobe and measures 2.3 x 1.8 x 1.6 cm. Portal vein is patent on color Doppler imaging with normal direction of blood flow towards the liver. Other: None. IMPRESSION: 1. Slightly distended gallbladder with sludge, but negative for wall thickness, sonographic Murphy, or other features to suggest acute gallbladder disease. Common duct diameter upper limits of normal 2. Slightly echogenic liver with multiple cysts Electronically Signed   By: Donavan Foil M.D.   On: 01/05/2020 18:07    Microbiology: Recent Results (from the past 240 hour(s))  Respiratory Panel by RT PCR (Flu A&B, Covid) - Nasopharyngeal Swab     Status: None   Collection Time: 01/05/20  2:43 PM   Specimen: Nasopharyngeal Swab  Result Value Ref Range Status   SARS Coronavirus 2 by RT PCR NEGATIVE NEGATIVE Final    Comment: (NOTE) SARS-CoV-2 target nucleic acids are NOT DETECTED. The SARS-CoV-2 RNA is generally detectable in upper respiratoy specimens during the acute phase of infection. The lowest concentration of SARS-CoV-2 viral copies this assay can detect is 131 copies/mL. A negative result does not preclude SARS-Cov-2 infection and should not be used as the sole basis for treatment or other patient management decisions. A negative result may occur with  improper specimen collection/handling, submission of specimen other than nasopharyngeal swab, presence of viral mutation(s) within the areas targeted by this assay, and inadequate number of  viral copies (<131 copies/mL). A negative result must be combined with clinical observations, patient history, and epidemiological information. The expected result is Negative. Fact Sheet for Patients:  PinkCheek.be Fact Sheet for Healthcare Providers:  GravelBags.it This test is not yet ap proved or cleared by the [place] FDA and  has been authorized for detection and/or diagnosis of SARS-CoV-2 by FDA under an Emergency Use Authorization (EUA). This EUA will remain  in effect (meaning this test can be used) for the duration of the COVID-19 declaration under Section 564(b)(1) of the Act, 21 U.S.C. section 360bbb-3(b)(1), unless the authorization is terminated or revoked sooner.    Influenza A by PCR NEGATIVE NEGATIVE Final   Influenza B by PCR NEGATIVE NEGATIVE Final    Comment: (NOTE) The Xpert Xpress SARS-CoV-2/FLU/RSV assay is intended as an aid in  the diagnosis of influenza from Nasopharyngeal swab specimens and  should not be used as a sole basis for treatment. Nasal washings and  aspirates are unacceptable for Xpert Xpress SARS-CoV-2/FLU/RSV  testing. Fact Sheet for Patients: PinkCheek.be Fact Sheet for Healthcare Providers: GravelBags.it This test is not yet approved or cleared by the [place] FDA and  has been authorized for detection and/or diagnosis of SARS-CoV-2 by  FDA under an Emergency Use Authorization (EUA). This EUA will remain  in effect (meaning this test can be used) for the duration of the  Covid-19 declaration under Section 564(b)(1) of the Act, 21  U.S.C. section 360bbb-3(b)(1), unless the authorization is  terminated or revoked. Performed at [facility], [place] [address] [place], [place] [address]   SARS CORONAVIRUS 2 (TAT 6-24 HRS) Nasopharyngeal Nasopharyngeal Swab  Status: None   Collection Time: 01/13/20  3:31 PM    Specimen: Nasopharyngeal Swab  Result Value Ref Range Status   SARS Coronavirus 2 NEGATIVE NEGATIVE Final    Comment: (NOTE) SARS-CoV-2 target nucleic acids are NOT DETECTED. The SARS-CoV-2 RNA is generally detectable in upper and lower respiratory specimens during the acute phase of infection. Negative results do not preclude SARS-CoV-2 infection, do not rule out co-infections with other pathogens, and should not be used as the sole basis for treatment or other patient management decisions. Negative results must be combined with clinical observations, patient history, and epidemiological information. The expected result is Negative. Fact Sheet for Patients: SugarRoll.be Fact Sheet for Healthcare Providers: https://www.woods-mathews.com/ This test is not yet approved or cleared by the [place] FDA and  has been authorized for detection and/or diagnosis of SARS-CoV-2 by FDA under an Emergency Use Authorization (EUA). This EUA will remain  in effect (meaning this test can be used) for the duration of the COVID-19 declaration under Section 56 4(b)(1) of the Act, 21 U.S.C. section 360bbb-3(b)(1), unless the authorization is terminated or revoked sooner. Performed at [facility], [place] [address], [place], [place] [address]      Labs: Basic Metabolic Panel: Recent Labs  Lab 01/07/20 5953 01/07/20 1002 01/10/20 0357 01/11/20 0356 01/13/20 0404  NA 147*  --  138 135 136  K 3.8  --  3.4* 4.7 4.2  CL 117*  --  105 103 104  CO2 18*  --  '24 22 23  ' GLUCOSE 139*  --  135* 155* 221*  BUN 20  --  '9 11 11  ' CREATININE 1.21  --  0.67 0.68 0.80  CALCIUM 7.9*  --  7.6* 7.9* 7.3*  MG  --  2.5* 1.8 1.8  --   PHOS  --   --  2.7  --   --    Liver Function Tests: Recent Labs  Lab 01/08/20 0509 01/10/20 0357 01/10/20 0916 01/11/20 0356 01/14/20 0255  AST 31  --  34 42*  --   ALT 32  --  29 33  --   ALKPHOS 662*  --  569* 628* 529*   BILITOT 1.2  --  1.1 1.0  --   PROT 5.2*  --  4.7* 5.2*  --   ALBUMIN 1.6* 1.3* 1.3* 1.5*  --    No results for input(s): LIPASE, AMYLASE in the last 168 hours. No results for input(s): AMMONIA in the last 168 hours. CBC: Recent Labs  Lab 01/07/20 1002 01/10/20 0357 01/12/20 0226 01/13/20 0404  WBC 6.0 5.3 6.5 6.4  NEUTROABS  --  2.2  --   --   HGB 9.6* 8.2* 7.2* 8.5*  HCT 29.7* 24.0* 21.3* 24.8*  MCV 80.7 77.2* 76.9* 78.7*  PLT 123* 96* 92* 93*   Cardiac Enzymes: No results for input(s): CKTOTAL, CKMB, CKMBINDEX, TROPONINI in the last 168 hours. BNP: BNP (last 3 results) No results for input(s): BNP in the last 8760 hours.  ProBNP (last 3 results) No results for input(s): PROBNP in the last 8760 hours.  CBG: Recent Labs  Lab 01/13/20 0807 01/13/20 1158 01/13/20 1626 01/13/20 2125 01/14/20 0812  GLUCAP 130* 205* 103* 199* 123*    Time spent: 32 minutes were spent in preparing this discharge including medication reconciliation, counseling, and coordination of care.  Signed:  Aislynn Cifelli Marry Guan, MD  [facility] 01/14/2020, 9:30 AM

## 2020-01-14 NOTE — Progress Notes (Signed)
PT Cancellation Note  Patient Details Name: Frank Daugherty MRN: [id number] DOB: 1946/06/22   Cancelled Treatment:    Reason Eval/Treat Not Completed: Other (comment) patient discharging to SNF today- holding per policy. Thank you for the opportunity to participate in the care of this patient!    Windell Norfolk, DPT, PN1   Supplemental Physical Therapist [facility]    Pager [phone] Acute Rehab Office [phone]

## 2020-01-14 NOTE — Progress Notes (Signed)
Nursing secretary Elisha Headland attempted to set up outpatient follow up with urology for Frank Daugherty she was told the SNF would need to set this up.  [place], LPN notified.

## 2020-01-14 NOTE — Progress Notes (Signed)
Report called to [facility].  Spoke to [place], [employer]

## 2020-01-14 NOTE — Consult Note (Signed)
Chief Complaint: Patient was seen in consultation today for anemia, thrombocytopenia  Referring Physician(s): Dr. Burr Medico  Supervising Physician: Markus Daft  Patient Status: [facility] - In-pt  History of Present Illness: Frank Daugherty is a 74 y.o. male with past medical history of HTN admitted with episode of lightheadedness, dizziness, weakness.  Patient found to have tachypnea, tachycardia, hypotension, and anemia with hemoglobin of 7.0. Patient has been elevated by Hematology/Oncology who recommends a bone marrow biopsy.   Patient assessed in radiology department this AM.  He is alert, oriented. Reports he is anxious about procedure today.  All questions answered.  He has been NPO.  Not currently on blood thinners.   Past Medical History:  Diagnosis Date  . Hypertension     History reviewed. No pertinent surgical history.  Allergies: Patient has no active allergies.  Medications: Prior to Admission medications   Medication Sig Start Date End Date Taking? Authorizing Provider  OVER THE COUNTER MEDICATION Take 1 tablet by mouth See admin instructions. OTC Espira Multivitamin for males: Take 1 tablet by mouth once a day with food   Yes [provider]  metoprolol succinate (TOPROL-XL) 100 MG 24 hr tablet Take 1 tablet (100 mg total) by mouth daily. Take with or immediately following a meal. 01/14/20 02/13/20  Hollice Gong, Mir Mohammed, MD  pravastatin (PRAVACHOL) 40 MG tablet Take 1 tablet (40 mg total) by mouth daily. Patient not taking: Reported on 01/05/2020 04/23/12 04/23/13  Deneise Lever, MD  tamsulosin (FLOMAX) 0.4 MG CAPS capsule Take 1 capsule (0.4 mg total) by mouth daily after supper. 01/13/20 02/12/20  Hollice Gong, Mir Mohammed, MD  triamterene-hydrochlorothiazide (DYAZIDE) 37.5-25 MG per capsule Take 1 each (1 capsule total) by mouth daily. Patient not taking: Reported on 01/05/2020 04/15/12 04/15/13  Deneise Lever, MD     Family History  Family history unknown: Yes     Social History   Socioeconomic History  . Marital status: Married    Spouse name: Not on file  . Number of children: Not on file  . Years of education: Not on file  . Highest education level: Not on file  Occupational History  . Not on file  Tobacco Use  . Smoking status: Never Smoker  . Smokeless tobacco: Never Used  Substance and Sexual Activity  . Alcohol use: Yes    Alcohol/week: 4.0 standard drinks    Types: 4 Cans of beer per week  . Drug use: Never  . Sexual activity: Not on file  Other Topics Concern  . Not on file  Social History Narrative  . Not on file   Social Determinants of Health   Financial Resource Strain:   . Difficulty of Paying Living Expenses: Not on file  Food Insecurity:   . Worried About Charity fundraiser in the Last Year: Not on file  . Ran Out of Food in the Last Year: Not on file  Transportation Needs:   . Lack of Transportation (Medical): Not on file  . Lack of Transportation (Non-Medical): Not on file  Physical Activity:   . Days of Exercise per Week: Not on file  . Minutes of Exercise per Session: Not on file  Stress:   . Feeling of Stress : Not on file  Social Connections:   . Frequency of Communication with Friends and Family: Not on file  . Frequency of Social Gatherings with Friends and Family: Not on file  . Attends Religious Services: Not on file  . Active Member of  Clubs or Organizations: Not on file  . Attends Archivist Meetings: Not on file  . Marital Status: Not on file     Review of Systems: A 12 point ROS discussed and pertinent positives are indicated in the HPI above.  All other systems are negative.  Review of Systems  Constitutional: Negative for fatigue and fever.  Respiratory: Negative for cough and shortness of breath.   Cardiovascular: Negative for chest pain.  Gastrointestinal: Negative for abdominal pain, diarrhea, nausea and vomiting.  Musculoskeletal: Negative for back pain.   Psychiatric/Behavioral: Negative for behavioral problems and confusion.    Vital Signs: BP 108/60 (BP Location: Left Arm)   Pulse 73   Temp 98.1 F (36.7 C) (Oral)   Resp 17   Wt 158 lb 15.2 oz (72.1 kg)   SpO2 98%   BMI 22.97 kg/m   Physical Exam Vitals and nursing note reviewed.  Constitutional:      General: He is not in acute distress.    Appearance: Normal appearance. He is not ill-appearing.  HENT:     Mouth/Throat:     Mouth: Mucous membranes are moist.     Pharynx: Oropharynx is clear.  Cardiovascular:     Rate and Rhythm: Normal rate and regular rhythm.     Heart sounds: No murmur.  Pulmonary:     Effort: Pulmonary effort is normal. No respiratory distress.     Breath sounds: Normal breath sounds.  Abdominal:     General: Abdomen is flat.     Palpations: Abdomen is soft.  Skin:    General: Skin is warm and dry.  Neurological:     Mental Status: He is alert.      MD Evaluation Airway: WNL Heart: WNL Abdomen: WNL Chest/ Lungs: WNL ASA  Classification: 3 Mallampati/Airway Score: Two   Imaging: DG Chest Port 1 View  Result Date: 01/05/2020 CLINICAL DATA:  Weakness EXAM: PORTABLE CHEST 1 VIEW COMPARISON:  None. FINDINGS: The heart size and mediastinal contours are within normal limits. No focal airspace consolidation, pleural effusion, or pneumothorax. The visualized skeletal structures are unremarkable. IMPRESSION: No active disease. Electronically Signed   By: Davina Poke D.O.   On: 01/05/2020 14:15   MR ABDOMEN MRCP W WO CONTAST  Result Date: 01/06/2020 CLINICAL DATA:  Painless jaundice and weight loss. EXAM: MRI ABDOMEN WITHOUT AND WITH CONTRAST (INCLUDING MRCP) TECHNIQUE: Multiplanar multisequence MR imaging of the abdomen was performed both before and after the administration of intravenous contrast. Heavily T2-weighted images of the biliary and pancreatic ducts were obtained, and three-dimensional MRCP images were rendered by post processing.  CONTRAST:  105m GADAVIST GADOBUTROL 1 MMOL/ML IV SOLN COMPARISON:  Ultrasound evaluation 01/05/2020 FINDINGS: Lower chest: Assessment of the lung bases is limited on MR. No signs of consolidation or pleural effusion. Hepatobiliary: Numerous hepatic cysts, signs of hepatic iron deposition. Is. Sludge layering in the dependent gallbladder. No signs of filling defect in the biliary tree. Pancreas: Limited assessment due to respiratory motion. No signs of ductal dilation or focal lesion. Arterial phase imaging is showing reasonable quality. Spleen:  Normal size spleen with signs of splenic iron deposition. Adrenals/Urinary Tract: Normal adrenals. Symmetric enhancement of bilateral kidneys. No signs of hydronephrosis. Stomach/Bowel: Visualized gastrointestinal tract is unremarkable with very limited assessment due to respiratory motion and without protocol for bowel assessment on MR. Vascular/Lymphatic: Patent abdominal vasculature. No signs of adenopathy. Other:  Trace ascites about the liver. Musculoskeletal: Very heterogeneous marrow signal with signs of iron deposition in  the marrow spaces. Assessment on in and out of phase is limited. Scattered areas of relative restricted diffusion are noted though diffusion is limited in the setting of iron deposition. Heterogeneous patchy marrow space enhancement is noted throughout. IMPRESSION: 1. Marked heterogeneity of the visualized marrow spaces of the spine and pelvis. Areas of restricted diffusion and abnormal enhancement on a background of iron deposition. Correlate with any history of chronic anemia and exogenous iron administration or transfusions. Findings raise the question of process such as multiple myeloma, perhaps this would explain the elevated alkaline phosphatase which is more pronounced than other hepatic enzyme elevations. 2. Numerous hepatic cysts and signs of hepatic and splenic iron deposition. 3. Trace ascites about the liver. 4. No signs of biliary  ductal dilation or filling defect. 5. Sludge layering in a mildly distended gallbladder. Electronically Signed   By: Zetta Bills M.D.   On: 01/06/2020 19:56   ECHOCARDIOGRAM COMPLETE  Result Date: 01/06/2020   ECHOCARDIOGRAM REPORT   Patient Name:   BRAYAN VOTAW Date of Exam: 01/06/2020 Medical Rec #:  [id number]     Height:       69.8 in Accession #:    [id number]    Weight:       194.5 lb Date of Birth:  October 23, 1946    BSA:          2.06 m Patient Age:    20 years      BP:           112/70 mmHg Patient Gender: M             HR:           120 bpm. Exam Location:  Inpatient Procedure: 2D Echo Indications:    Atrial fibrillation  History:        Patient has no prior history of Echocardiogram examinations.                 Risk Factors:Hypertension and Dyslipidemia.  Sonographer:    Clayton Lefort RDCS (AE) Referring Phys: [id number] [place]  1. Left ventricular ejection fraction, by visual estimation, is 55 to 60%. The left ventricle has normal function. There is mildly increased left ventricular hypertrophy.  2. Left ventricular diastolic function could not be evaluated.  3. The left ventricle has no regional wall motion abnormalities.  4. Global right ventricle has normal systolic function.The right ventricular size is normal. No increase in right ventricular wall thickness.  5. Left atrial size was normal.  6. Right atrial size was normal.  7. Mild mitral annular calcification.  8. The mitral valve is grossly normal. Mild mitral valve regurgitation.  9. The tricuspid valve is normal in structure. 10. The tricuspid valve is normal in structure. Tricuspid valve regurgitation is trivial. 11. The aortic valve is tricuspid. Aortic valve regurgitation is not visualized. 12. The pulmonic valve was not well visualized. Pulmonic valve regurgitation is not visualized. 13. The inferior vena cava is normal in size with greater than 50% respiratory variability, suggesting right atrial pressure of 3 mmHg.  FINDINGS  Left Ventricle: Left ventricular ejection fraction, by visual estimation, is 55 to 60%. The left ventricle has normal function. The left ventricle has no regional wall motion abnormalities. There is mildly increased left ventricular hypertrophy. Concentric left ventricular hypertrophy. The left ventricular diastology could not be evaluated due to nondiagnostic images. Left ventricular diastolic function could not be evaluated. Right Ventricle: The right ventricular size is normal. No increase in right ventricular  wall thickness. Global RV systolic function is has normal systolic function. Left Atrium: Left atrial size was normal in size. Right Atrium: Right atrial size was normal in size Pericardium: There is no evidence of pericardial effusion. Mitral Valve: The mitral valve is grossly normal. There is mild thickening of the mitral valve leaflet(s). There is mild calcification of the mitral valve leaflet(s). Mild mitral annular calcification. Mild mitral valve regurgitation. Tricuspid Valve: The tricuspid valve is normal in structure. Tricuspid valve regurgitation is trivial. Aortic Valve: The aortic valve is tricuspid. . There is mild thickening and mild calcification of the aortic valve. Aortic valve regurgitation is not visualized. There is mild thickening of the aortic valve. There is mild calcification of the aortic valve. Pulmonic Valve: The pulmonic valve was not well visualized. Pulmonic valve regurgitation is not visualized. Pulmonic regurgitation is not visualized. Aorta: The aortic root, ascending aorta and aortic arch are all structurally normal, with no evidence of dilitation or obstruction. Pulmonary Artery: The pulmonary artery is not well seen. Venous: The inferior vena cava is normal in size with greater than 50% respiratory variability, suggesting right atrial pressure of 3 mmHg. IAS/Shunts: No atrial level shunt detected by color flow Doppler.  LEFT VENTRICLE PLAX 2D LVIDd:         4.70  cm LVIDs:         3.30 cm LV PW:         1.25 cm LV IVS:        1.40 cm LVOT diam:     2.00 cm LV SV:         58 ml LV SV Index:   27.70 LVOT Area:     3.14 cm  RIGHT VENTRICLE             IVC RV Basal diam:  3.10 cm     IVC diam: 1.20 cm RV S prime:     15.70 cm/s TAPSE (M-mode): 2.3 cm LEFT ATRIUM           Index       RIGHT ATRIUM           Index LA diam:      3.10 cm 1.51 cm/m  RA Area:     15.80 cm LA Vol (A2C): 42.8 ml 20.80 ml/m RA Volume:   40.20 ml  19.54 ml/m LA Vol (A4C): 53.2 ml 25.85 ml/m  AORTIC VALVE LVOT Vmax:   125.00 cm/s LVOT Vmean:  80.740 cm/s LVOT VTI:    0.227 m  AORTA Ao Root diam: 3.30 cm Ao Asc diam:  3.10 cm  SHUNTS Systemic VTI:  0.23 m Systemic Diam: 2.00 cm  Buford Dresser MD Electronically signed by Buford Dresser MD Signature Date/Time: 01/06/2020/1:14:41 PM    Final    US Abdomen Limited RUQ  Result Date: 01/05/2020 CLINICAL DATA:  Abdominal pain for 2 weeks EXAM: ULTRASOUND ABDOMEN LIMITED RIGHT UPPER QUADRANT COMPARISON:  None. Chest x-ray 01/05/2020 FINDINGS: Gallbladder: Slightly distended. Moderate sludge in the gallbladder. No shadowing stones. Normal wall thickness. Negative sonographic Murphy Common bile duct: Diameter: 6 mm Liver: Liver is slightly echogenic. Multiple cysts within the liver. The largest is seen in the posterior right hepatic lobe and measures 2.3 x 1.8 x 1.6 cm. Portal vein is patent on color Doppler imaging with normal direction of blood flow towards the liver. Other: None. IMPRESSION: 1. Slightly distended gallbladder with sludge, but negative for wall thickness, sonographic Murphy, or other features to suggest acute gallbladder disease. Common duct  diameter upper limits of normal 2. Slightly echogenic liver with multiple cysts Electronically Signed   By: Donavan Foil M.D.   On: 01/05/2020 18:07    Labs:  CBC: Recent Labs    01/07/20 1002 01/10/20 0357 01/12/20 0226 01/13/20 0404  WBC 6.0 5.3 6.5 6.4  HGB 9.6* 8.2* 7.2*  8.5*  HCT 29.7* 24.0* 21.3* 24.8*  PLT 123* 96* 92* 93*    COAGS: Recent Labs    01/07/20 0953 01/08/20 1009 01/09/20 0354 01/10/20 0357  INR 1.8* 1.6* 1.6* 1.5*    BMP: Recent Labs    01/07/20 0953 01/10/20 0357 01/11/20 0356 01/13/20 0404  NA 147* 138 135 136  K 3.8 3.4* 4.7 4.2  CL 117* 105 103 104  CO2 18* _0 GLUCOSE 139* 135* 155* 221*  BUN _1 CALCIUM 7.9* 7.6* 7.9* 7.3*  CREATININE 1.21 0.67 0.68 0.80  GFRNONAA 59* >60 >60 >60  GFRAA >60 >60 >60 >60    LIVER FUNCTION TESTS: Recent Labs    01/07/20 0430 01/07/20 0430 01/08/20 0509 01/10/20 0357 01/10/20 0916 01/11/20 0356 01/14/20 0255  BILITOT 1.4*  --  1.2  --  1.1 1.0  --   AST 53*  --  31  --  34 42*  --   ALT 42  --  32  --  29 33  --   ALKPHOS 792*   < > 662*  --  569* 628* 529*  PROT 6.0*  --  5.2*  --  4.7* 5.2*  --   ALBUMIN 1.7*   < > 1.6* 1.3* 1.3* 1.5*  --    < > = values in this interval not displayed.    TUMOR MARKERS: No results for input(s): AFPTM, CEA, CA199, CHROMGRNA in the last 8760 hours.  Assessment and Plan: Anemia, thrombocytopenia, abnormal LFTs Patient undergoing work-up for his abnormal lab work.  He has been assessed by Heme/Onc who recommends bone marrow biopsy.  He has been NPO for procedure today.  CT/cytology able to accomodate. Agreeable to proceed.   Risks and benefits  discussed with the patient and/or patient's family including, but not limited to bleeding, infection, damage to adjacent structures or low yield requiring additional tests.  All of the questions were answered and there is agreement to proceed.  Consent signed and in chart.  Thank you for this interesting consult.  I greatly enjoyed meeting Neno Hohensee and look forward to participating in their care.  A copy of this report was sent to the requesting provider on this date.  Electronically Signed: Docia Barrier, PA 01/14/2020, 9:10 AM   I spent a total of 20  Minutes    in face to face in clinical consultation, greater than 50% of which was counseling/coordinating care for anemia, thrombocytopenia.

## 2020-01-14 NOTE — TOC Transition Note (Signed)
Transition of Care [facility]) - CM/SW Discharge Note   Patient Details  Name: Frank Daugherty MRN: [id number] Date of Birth: Jun 21, 1946  [facility] of [facility]) CM/SW Contact:  Vinie Sill, [place] Phone Number: 01/14/2020, 10:40 AM   Clinical Narrative:     Patient will DC to: [facility] Date: 01/14/2020 Family Notified: Chartered loss adjuster, son Transport By: Corey Harold  Scheduled @ 12:00pm  RN, patient, and facility notified of DC. Discharge Summary sent to facility. RN given number for [phone], Room 102-P. Ambulance transport requested for patient.   Clinical Social Worker signing off. Thurmond Butts, MSW, [place] Clinical Social Worker    Final next level of care: Skilled Nursing Facility Barriers to Discharge: Continued Medical Work up   Patient Goals and CMS Choice Patient states their goals for this hospitalization and ongoing recovery are:: to go to rehab then home CMS Medicare.gov Compare Post Acute Care list provided to:: Patient Choice offered to / list presented to : Patient  Discharge Placement PASRR number recieved: 01/12/20            Patient chooses bed at: [facility] Patient to be transferred to facility by: [facility] Name of family member notified: left voice mmessage with patient's son Ephriam Knuckles- patient is alert and oriented Patient and family notified of of transfer: 01/14/20  Discharge Plan and Services     Post Acute Care Choice: [place]                               Social Determinants of Health (SDOH) Interventions     Readmission Risk Interventions No flowsheet data found.

## 2020-01-15 ENCOUNTER — Other Ambulatory Visit: Payer: Self-pay

## 2020-01-15 LAB — ANA W/REFLEX IF POSITIVE: Anti Nuclear Antibody (ANA): NEGATIVE

## 2020-01-15 LAB — FOLATE RBC
Folate, Hemolysate: 201 ng/mL
Folate, RBC: 801 ng/mL (ref >498)
Hematocrit: 25.1 % — ABNORMAL LOW (ref 37.5–51.0)

## 2020-01-15 LAB — ANTI-SMOOTH MUSCLE ANTIBODY, IGG: F-Actin IgG: 7 Units (ref 0–19)

## 2020-01-15 LAB — MITOCHONDRIAL ANTIBODIES: Mitochondrial M2 Ab, IgG: <20 Units (ref 0.0–20.0)

## 2020-01-18 ENCOUNTER — Telehealth: Payer: Self-pay | Admitting: Hematology

## 2020-01-18 LAB — SURGICAL PATHOLOGY

## 2020-01-18 NOTE — Telephone Encounter (Signed)
Frank Daugherty has been cld and rescheduled to see Dr. Burr Medico for a hospital f/u on 2/10 at 2:40pm.

## 2020-01-18 NOTE — Progress Notes (Signed)
[place]   [phone] [phone]   Clinic Follow up Note   Patient Care Team: Patient, No Pcp Per as PCP - General ([place]) O'Neal, Cassie Freer, MD as PCP - Cardiology (Internal Medicine)  I connected with Frank Daugherty on 01/20/2020 at  3:00 PM EST by telephone visit and verified that I am speaking with the correct person using two identifiers.  I discussed the limitations, risks, security and privacy concerns of performing an evaluation and management service by telephone and the availability of in person appointments. I also discussed with the patient that there may be a patient responsible charge related to this service. The patient expressed understanding and agreed to proceed.   Other persons participating in the visit and their role in the encounter: Nurse Janett Billow. I called his son as well.   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 Slightly distended gallbladder with sludge, but negative for wall thickness, sonographic Murphy, or other features to suggest acute gallbladder disease. Common duct diameter upper limits of normal 2. Slightly echogenic liver with multiple cysts     01/06/2020 Imaging   MRI Abdomen 01/06/20 IMPRESSION: 1. Marked heterogeneity of the visualized marrow spaces of the spine and pelvis. Areas of restricted diffusion and abnormal enhancement on a background of iron deposition. Correlate with any history of chronic anemia and exogenous iron administration or transfusions. Findings raise the question of process such as multiple myeloma, perhaps this would explain the elevated alkaline phosphatase which is more pronounced than other hepatic enzyme  elevations. 2. Numerous hepatic cysts and signs of hepatic and splenic iron deposition. 3. Trace ascites about the liver. 4. No signs of biliary ductal dilation or filling defect. 5. Sludge layering in a mildly distended gallbladder.   01/14/2020 Initial Biopsy   DIAGNOSIS: 01/14/20  BONE MARROW, ASPIRATE, CLOT, CORE:  - Metastatic carcinoma, see comment.   PERIPHERAL BLOOD:  - Normocytic anemia.  - Thrombocytopenia.   COMMENT:  The carcinoma is poorly differentiated and the immunoprofile is somewhat  non-specific perhaps due to decalcification. However, given the focal  neuroendocrine and prostate markers, a prostate primary should be  considered.    01/20/2020 Initial Diagnosis   Metastatic carcinoma to bone [facility])      INTERVAL HISTORY:  Frank Daugherty is here for a follow up as outpatient. He is currently at [facility] place rehab. He notes since rehab he is doing well overall. He notes he has been doing PT with exercise 30-60 minutes walking. He is able to ambulate dependently or with someone, no cane or walker. He can go to bathroom by himself. He denies any pain. He is not sure how long he will be in rehab. His two sons, one of which lives in [place] and [place] stay with him after he leaved Rehab. He notes he has medicare.    REVIEW OF SYSTEMS:   Constitutional: Denies fevers, chills or abnormal weight loss Eyes: Denies blurriness of vision Ears, nose, mouth, throat, and face: Denies mucositis or sore throat Respiratory: Denies cough, dyspnea or wheezes Cardiovascular: Denies palpitation, chest discomfort or lower extremity swelling Gastrointestinal:  Denies nausea, heartburn or change in bowel habits Skin: Denies abnormal skin rashes Lymphatics: Denies new lymphadenopathy or easy bruising Neurological:Denies  numbness, tingling or new weaknesses Behavioral/Psych: Mood is stable, no new changes  All other systems were reviewed with the patient and are negative.  MEDICAL HISTORY:   Past Medical History:  Diagnosis Date  . Hypertension     SURGICAL HISTORY: History reviewed. No pertinent surgical history.  I have reviewed the social history and family history with the patient and they are unchanged from previous note.  ALLERGIES:  has no active allergies.  MEDICATIONS:  Current Outpatient Medications  Medication Sig Dispense Refill  . metoprolol succinate (TOPROL-XL) 100 MG 24 hr tablet Take 1 tablet (100 mg total) by mouth daily. Take with or immediately following a meal. 30 tablet 0  . pravastatin (PRAVACHOL) 40 MG tablet Take 1 tablet (40 mg total) by mouth daily. (Patient not taking: Reported on 01/05/2020) 90 tablet 3  . tamsulosin (FLOMAX) 0.4 MG CAPS capsule Take 1 capsule (0.4 mg total) by mouth daily after supper. 30 capsule 0   No current facility-administered medications for this visit.    PHYSICAL EXAMINATION: ECOG PERFORMANCE STATUS: 3 - Symptomatic, >50% confined to bed  No vitals taken today, Exam not performed today  LABORATORY DATA:  I have reviewed the data as listed CBC Latest Ref Rng & Units 01/14/2020 01/14/2020 01/13/2020  WBC 4.0 - 10.5 K/uL 6.5 - 6.4  Hemoglobin 13.0 - 17.0 g/dL 8.5(L) - 8.5(L)  Hematocrit 37.5 - 51.0 % 25.2(L) 25.1(L) 24.8(L)  Platelets 150 - 400 K/uL 86(L) - 93(L)     CMP Latest Ref Rng & Units 01/14/2020 01/13/2020 01/11/2020  Glucose 70 - 99 mg/dL - 221(H) 155(H)  BUN 8 - 23 mg/dL - 11 11  Creatinine 0.61 - 1.24 mg/dL - 0.80 0.68  Sodium 135 - 145 mmol/L - 136 135  Potassium 3.5 - 5.1 mmol/L - 4.2 4.7  Chloride 98 - 111 mmol/L - 104 103  CO2 22 - 32 mmol/L - 23 22  Calcium 8.9 - 10.3 mg/dL - 7.3(L) 7.9(L)  Total Protein 6.5 - 8.1 g/dL - - 5.2(L)  Total Bilirubin 0.3 - 1.2 mg/dL - - 1.0  Alkaline Phos 38 - 126 U/L 529(H) - 628(H)  AST 15 - 41 U/L - - 42(H)  ALT 0 - 44 U/L - - 33      RADIOGRAPHIC STUDIES: I have personally reviewed the radiological images as listed and agreed with the findings in the  report. No results found.   ASSESSMENT & PLAN:  Frank Daugherty is a 74 y.o. male with    1. 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  -I discussed his Bone Marrow biopsy from 01/14/20 which shows poorly differentiated carcinoma and the immunoprofile is somewhat non-specific perhaps due to decalcification. However, given the focal neuroendocrine and prostate markers, a prostate primary should be considered. -His recent abdominal MRI was negative for malignancy. I recommend a CT Chest and Pelvic MRI to further evaluate for primary cancer location. I recommend baseline labs same day, he is agreeable. I discussed he is found to have prostate cancer primary he has several treatment options and I would refer to urology for possible biopsy.  -I will discuss this with his son Jeffar. He is agreeable.  -He is currently at [facility] rehab since hospitalization. They will help transport him.  -I will f/u with him after scans.  2. Elevated alkaline phosphatase -Pt tells me that he had hepatitis when he was in college, but his hepatitis panel was negative. He is not a heavy alcohol drinker.   3. 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  -His HFE gene mutation for hemochromatosis was unremarkable.  -His anemia worsened in hospital and required blood transfusion on 01/15/20.    4. Thrombocytopenia, Afib  -Seen during hospitalization. Will monitor.    5. Social Support  -He lives in [place] alone. He has been at [facility] place rehab since hospital discharge.  -He has 2 sons. His son Reyes Ivan lives in [place] and after patient leaves rehab he plans to stay with Jeffar for some time before returning home.  -He  has Medicare, per patient.    PLAN:  -CT Chest, Pelvic MRI and lab in 1-2 weeks  -f/u in 2 weeks  -I called his son to discuss visit.    No problem-specific Assessment & Plan notes found for this encounter.   Orders Placed This Encounter  Procedures  . CT Chest Wo Contrast    Standing Status:   Future    Standing Expiration Date:   01/19/2021    Order Specific Question:   Preferred imaging location?    Answer:   [facility]    Order Specific Question:   Radiology Contrast Protocol - do NOT remove file path    Answer:   \\[email or web address]\[email or web address]\CTProtocols.pdf  . MR Pelvis W Wo Contrast    Standing Status:   Future    Standing Expiration Date:   03/19/2021    Order Specific Question:   If indicated for the ordered procedure, I authorize the administration of contrast media per Radiology protocol    Answer:   Yes    Order Specific Question:   What is the patient's sedation requirement?    Answer:   No Sedation    Order Specific Question:   Does the patient have a pacemaker or implanted devices?    Answer:   No    Order Specific Question:   Radiology Contrast Protocol - do NOT remove file path    Answer:   \\[email or web address]\Radiant\[email or web address].PDF    Order Specific Question:   Preferred imaging location?    Answer:   [facility] (table limit-350 lbs)  . CBC with Differential (Cancer Center Only)    Standing Status:   Standing    Number of Occurrences:   50    Standing Expiration Date:   01/19/2025  . CMP ([place] only)    Standing Status:   Standing    Number of Occurrences:   50    Standing Expiration Date:   01/19/2025  . CEA (IN HOUSE-[facility])    Standing Status:   Future    Standing Expiration Date:   01/19/2021  . PSA, total and free    Standing Status:   Future    Standing Expiration Date:   01/19/2021   I discussed the assessment and treatment plan with the patient. The patient was provided an opportunity to ask questions and all were  answered. The patient agreed with the plan and demonstrated an understanding of the instructions.  The patient was advised to call back or seek an in-person evaluation if the symptoms worsen or if the condition fails to improve as anticipated.  The total time spent in the appointment was 30 minutes.  Truitt Merle, MD 01/20/2020   I, Joslyn Devon, am acting as scribe for Truitt Merle, MD.   I have reviewed the above documentation for accuracy and completeness, and I agree with the above.

## 2020-01-20 ENCOUNTER — Encounter: Payer: Self-pay | Admitting: Hematology

## 2020-01-20 ENCOUNTER — Ambulatory Visit (HOSPITAL_BASED_OUTPATIENT_CLINIC_OR_DEPARTMENT_OTHER): Payer: Medicare Other | Admitting: Hematology

## 2020-01-20 DIAGNOSIS — C7951 Secondary malignant neoplasm of bone: Secondary | ICD-10-CM

## 2020-01-20 LAB — METHYLMALONIC ACID, SERUM: Methylmalonic Acid, Quantitative: 103 nmol/L (ref 0–378)

## 2020-01-21 ENCOUNTER — Encounter (HOSPITAL_COMMUNITY): Payer: Self-pay | Admitting: Hematology

## 2020-01-21 ENCOUNTER — Other Ambulatory Visit: Payer: Self-pay

## 2020-01-21 DIAGNOSIS — C7951 Secondary malignant neoplasm of bone: Secondary | ICD-10-CM

## 2020-01-22 ENCOUNTER — Telehealth: Payer: Self-pay

## 2020-01-22 NOTE — Telephone Encounter (Signed)
I spoke with Frank Daugherty at [facility] .  I reviewed [name]' up coming appointments at [facility] and [facility].  She verbalized understanding and confirmed date and times and will arrange for transportation.

## 2020-01-22 NOTE — Telephone Encounter (Signed)
I spoke to Frank Daugherty son Frank Daugherty.  I reviewed his father's upcoming appointments for lab, CT scan, MRI, and Dr Burr Medico.  He verbalized understanding.

## 2020-01-27 NOTE — Progress Notes (Unsigned)
{Choose 1 Note Type (Telehealth Visit or Telephone Visit):[phone]}   Date:  01/27/2020   ID:  Demari Gales, DOB [birth year], MRN [id number]  {Patient Location:[phone]::"Home"} {Provider Location:[phone]::"Home"}  PCP:  Patient, No Pcp Per  Cardiologist:  Evalina Field, MD *** Electrophysiologist:  None   Evaluation Performed:  {Choose Visit Type:[id number]::"Follow-Up Visit"}  Chief Complaint:  Atrial fibrillation  History of Present Illness:    Francesco Provencal is a 74 y.o. male with history of atrial fibrillation, HTN who presents for hospital follow-up of atrial fibrillation. Admitted 1/26-2/4 after near syncope and atrial fibrillation with RVR. Afib spontaneously resolved and he was found to have metastatic cancer of unknown primary. He had severely elevated ferritin but was negative for hemochromatosis. Bone marrow biopsy with concerns for metastatic carcinoma. [place] discharged on beta blocker. Due to anemia and concerns for malignancy he was not started on anticoagulation in the hospital. He was also coagulopathic. We had plans for outpatient monitor and possible cardiac MR. No further cardiac symptoms.   The patient {does/does not:200015} have symptoms concerning for COVID-19 infection (fever, chills, cough, or new shortness of breath).    Past Medical History:  Diagnosis Date  . Hypertension    No past surgical history on file.   No outpatient medications have been marked as taking for the 01/28/20 encounter (Appointment) with O'Neal, Cassie Freer, MD.     Allergies:   Patient has no active allergies.   Social History   Tobacco Use  . Smoking status: Never Smoker  . Smokeless tobacco: Never Used  Substance Use Topics  . Alcohol use: Yes    Alcohol/week: 4.0 standard drinks    Types: 4 Cans of beer per week  . Drug use: Never     Family Hx: The patient's Family history is unknown by patient.  ROS:   Please see the history of present illness.    *** All  other systems reviewed and are negative.   Prior CV studies:   The following studies were reviewed today:  TTE 12/27/2019 1. Left ventricular ejection fraction, by visual estimation, is 55 to  60%. The left ventricle has normal function. There is mildly increased  left ventricular hypertrophy.  2. Left ventricular diastolic function could not be evaluated.  3. The left ventricle has no regional wall motion abnormalities.  4. Global right ventricle has normal systolic function.The right  ventricular size is normal. No increase in right ventricular wall  thickness.  5. Left atrial size was normal.  6. Right atrial size was normal.  7. Mild mitral annular calcification.  8. The mitral valve is grossly normal. Mild mitral valve regurgitation.  9. The tricuspid valve is normal in structure.  10. The tricuspid valve is normal in structure. Tricuspid valve  regurgitation is trivial.  11. The aortic valve is tricuspid. Aortic valve regurgitation is not  visualized.  12. The pulmonic valve was not well visualized. Pulmonic valve  regurgitation is not visualized.  13. The inferior vena cava is normal in size with greater than 50%  respiratory variability, suggesting right atrial pressure of 3 mmHg.  Labs/Other Tests and Data Reviewed:    EKG:  {EKG/Telemetry Strips [phone]}  Recent Labs: 01/05/2020: TSH 2.281 01/11/2020: ALT 33; Magnesium 1.8 01/13/2020: BUN 11; Creatinine, Ser 0.80; Potassium 4.2; Sodium 136 01/14/2020: Hemoglobin 8.5; Platelets 86   Recent Lipid Panel Lab Results  Component Value Date/Time   LDLDIRECT 111 (H) 04/15/2012 02:30 PM    Wt Readings from Last 3 Encounters:  01/14/20  158 lb 15.2 oz (72.1 kg)  04/23/12 194 lb 8 oz (88.2 kg)  04/15/12 198 lb (89.8 kg)     Objective:    Vital Signs:  There were no vitals taken for this visit.   {[facility] Virtual Exam (Optional):[phone]::"VITAL SIGNS:  reviewed"}  ASSESSMENT & PLAN:     1. ***  COVID-19 Education: The signs and symptoms of COVID-19 were discussed with the patient and how to seek care for testing (follow up with PCP or arrange E-visit).  ***The importance of social distancing was discussed today.  Time:   Today, I have spent *** minutes with the patient with telehealth technology discussing the above problems.     Medication Adjustments/Labs and Tests Ordered: Current medicines are reviewed at length with the patient today.  Concerns regarding medicines are outlined above.   Tests Ordered: No orders of the defined types were placed in this encounter.   Medication Changes: No orders of the defined types were placed in this encounter.   Follow Up:  {F/U [phone]} {follow up:15908}  Signed, Evalina Field, MD  01/27/2020 6:08 PM    [facility]

## 2020-01-28 ENCOUNTER — Ambulatory Visit: Payer: Medicare Other | Admitting: Hematology

## 2020-01-28 ENCOUNTER — Encounter (HOSPITAL_COMMUNITY): Payer: Self-pay | Admitting: Internal Medicine

## 2020-01-28 ENCOUNTER — Other Ambulatory Visit: Payer: Self-pay

## 2020-01-28 ENCOUNTER — Emergency Department (HOSPITAL_COMMUNITY): Payer: Medicare Other

## 2020-01-28 ENCOUNTER — Telehealth: Payer: Medicare Other | Admitting: Cardiovascular Disease

## 2020-01-28 ENCOUNTER — Inpatient Hospital Stay (HOSPITAL_COMMUNITY)
Admission: EM | Admit: 2020-01-28 | Discharge: 2020-02-05 | DRG: 871 | Disposition: A | Discharge status: Skilled Nursing Facility | Payer: Medicare Other | Source: Skilled Nursing Facility | Attending: Internal Medicine | Admitting: Internal Medicine

## 2020-01-28 DIAGNOSIS — D689 Coagulation defect, unspecified: Secondary | ICD-10-CM | POA: Diagnosis present

## 2020-01-28 DIAGNOSIS — B9620 Unspecified Escherichia coli [E. coli] as the cause of diseases classified elsewhere: Secondary | ICD-10-CM | POA: Diagnosis not present

## 2020-01-28 DIAGNOSIS — R6521 Severe sepsis with septic shock: Secondary | ICD-10-CM | POA: Diagnosis present

## 2020-01-28 DIAGNOSIS — D638 Anemia in other chronic diseases classified elsewhere: Secondary | ICD-10-CM | POA: Diagnosis present

## 2020-01-28 DIAGNOSIS — N179 Acute kidney failure, unspecified: Secondary | ICD-10-CM | POA: Diagnosis not present

## 2020-01-28 DIAGNOSIS — Z6823 Body mass index (BMI) 23.0-23.9, adult: Secondary | ICD-10-CM | POA: Diagnosis not present

## 2020-01-28 DIAGNOSIS — C61 Malignant neoplasm of prostate: Secondary | ICD-10-CM | POA: Diagnosis present

## 2020-01-28 DIAGNOSIS — N170 Acute kidney failure with tubular necrosis: Secondary | ICD-10-CM | POA: Diagnosis present

## 2020-01-28 DIAGNOSIS — R531 Weakness: Secondary | ICD-10-CM | POA: Diagnosis not present

## 2020-01-28 DIAGNOSIS — C7952 Secondary malignant neoplasm of bone marrow: Secondary | ICD-10-CM | POA: Diagnosis present

## 2020-01-28 DIAGNOSIS — A4181 Sepsis due to Enterococcus: Principal | ICD-10-CM | POA: Diagnosis present

## 2020-01-28 DIAGNOSIS — R627 Adult failure to thrive: Secondary | ICD-10-CM | POA: Diagnosis present

## 2020-01-28 DIAGNOSIS — A4151 Sepsis due to Escherichia coli [E. coli]: Secondary | ICD-10-CM | POA: Diagnosis present

## 2020-01-28 DIAGNOSIS — N139 Obstructive and reflux uropathy, unspecified: Secondary | ICD-10-CM | POA: Diagnosis present

## 2020-01-28 DIAGNOSIS — R6520 Severe sepsis without septic shock: Secondary | ICD-10-CM

## 2020-01-28 DIAGNOSIS — D649 Anemia, unspecified: Secondary | ICD-10-CM | POA: Diagnosis not present

## 2020-01-28 DIAGNOSIS — C799 Secondary malignant neoplasm of unspecified site: Secondary | ICD-10-CM | POA: Diagnosis not present

## 2020-01-28 DIAGNOSIS — Z960 Presence of urogenital implants: Secondary | ICD-10-CM | POA: Diagnosis present

## 2020-01-28 DIAGNOSIS — K769 Liver disease, unspecified: Secondary | ICD-10-CM | POA: Diagnosis not present

## 2020-01-28 DIAGNOSIS — I10 Essential (primary) hypertension: Secondary | ICD-10-CM | POA: Diagnosis present

## 2020-01-28 DIAGNOSIS — Z79899 Other long term (current) drug therapy: Secondary | ICD-10-CM

## 2020-01-28 DIAGNOSIS — C787 Secondary malignant neoplasm of liver and intrahepatic bile duct: Secondary | ICD-10-CM | POA: Diagnosis present

## 2020-01-28 DIAGNOSIS — Z7189 Other specified counseling: Secondary | ICD-10-CM

## 2020-01-28 DIAGNOSIS — D630 Anemia in neoplastic disease: Secondary | ICD-10-CM | POA: Diagnosis present

## 2020-01-28 DIAGNOSIS — B952 Enterococcus as the cause of diseases classified elsewhere: Secondary | ICD-10-CM | POA: Diagnosis not present

## 2020-01-28 DIAGNOSIS — R7989 Other specified abnormal findings of blood chemistry: Secondary | ICD-10-CM | POA: Diagnosis not present

## 2020-01-28 DIAGNOSIS — J90 Pleural effusion, not elsewhere classified: Secondary | ICD-10-CM | POA: Diagnosis present

## 2020-01-28 DIAGNOSIS — R937 Abnormal findings on diagnostic imaging of other parts of musculoskeletal system: Secondary | ICD-10-CM | POA: Diagnosis not present

## 2020-01-28 DIAGNOSIS — R7401 Elevation of levels of liver transaminase levels: Secondary | ICD-10-CM | POA: Diagnosis present

## 2020-01-28 DIAGNOSIS — Z20822 Contact with and (suspected) exposure to covid-19: Secondary | ICD-10-CM | POA: Diagnosis present

## 2020-01-28 DIAGNOSIS — C7951 Secondary malignant neoplasm of bone: Secondary | ICD-10-CM

## 2020-01-28 DIAGNOSIS — E43 Unspecified severe protein-calorie malnutrition: Secondary | ICD-10-CM | POA: Diagnosis present

## 2020-01-28 DIAGNOSIS — G9341 Metabolic encephalopathy: Secondary | ICD-10-CM | POA: Diagnosis present

## 2020-01-28 DIAGNOSIS — K7200 Acute and subacute hepatic failure without coma: Secondary | ICD-10-CM | POA: Diagnosis present

## 2020-01-28 DIAGNOSIS — R7881 Bacteremia: Secondary | ICD-10-CM | POA: Diagnosis not present

## 2020-01-28 DIAGNOSIS — R64 Cachexia: Secondary | ICD-10-CM | POA: Diagnosis present

## 2020-01-28 DIAGNOSIS — Z515 Encounter for palliative care: Secondary | ICD-10-CM | POA: Diagnosis not present

## 2020-01-28 DIAGNOSIS — N390 Urinary tract infection, site not specified: Secondary | ICD-10-CM | POA: Diagnosis present

## 2020-01-28 DIAGNOSIS — A419 Sepsis, unspecified organism: Secondary | ICD-10-CM | POA: Diagnosis present

## 2020-01-28 DIAGNOSIS — D6959 Other secondary thrombocytopenia: Secondary | ICD-10-CM | POA: Diagnosis present

## 2020-01-28 DIAGNOSIS — I480 Paroxysmal atrial fibrillation: Secondary | ICD-10-CM | POA: Diagnosis present

## 2020-01-28 DIAGNOSIS — E860 Dehydration: Secondary | ICD-10-CM | POA: Diagnosis present

## 2020-01-28 HISTORY — DX: Anemia, unspecified: D64.9

## 2020-01-28 LAB — URINALYSIS, ROUTINE W REFLEX MICROSCOPIC
Bilirubin Urine: NEGATIVE
Glucose, UA: NEGATIVE mg/dL
Ketones, ur: 5 mg/dL — AB
Nitrite: NEGATIVE
Protein, ur: 100 mg/dL — AB
RBC / HPF: >50 RBC/hpf — ABNORMAL HIGH (ref 0–5)
Specific Gravity, Urine: 1.02 (ref 1.005–1.030)
WBC, UA: >50 WBC/hpf — ABNORMAL HIGH (ref 0–5)
pH: 5 (ref 5.0–8.0)

## 2020-01-28 LAB — CBC WITH DIFFERENTIAL/PLATELET
Abs Immature Granulocytes: 0.66 10*3/uL — ABNORMAL HIGH (ref 0.00–0.07)
Band Neutrophils: 17 %
Basophils Absolute: 0 10*3/uL (ref 0.0–0.1)
Basophils Relative: 0 %
Blasts: 0 %
Eosinophils Absolute: 0 10*3/uL (ref 0.0–0.5)
Eosinophils Relative: 0 %
HCT: 21 % — ABNORMAL LOW (ref 39.0–52.0)
Hemoglobin: 6.7 g/dL — CL (ref 13.0–17.0)
Lymphocytes Relative: 16 %
Lymphs Abs: 1.8 10*3/uL (ref 0.7–4.0)
MCH: 26.9 pg (ref 26.0–34.0)
MCHC: 31.9 g/dL (ref 30.0–36.0)
MCV: 84.3 fL (ref 80.0–100.0)
Metamyelocytes Relative: 6 %
Monocytes Absolute: 0.1 10*3/uL (ref 0.1–1.0)
Monocytes Relative: 1 %
Myelocytes: 0 %
Neutro Abs: 8.4 10*3/uL — ABNORMAL HIGH (ref 1.7–7.7)
Neutrophils Relative %: 60 %
Other: 0 %
Platelets: 135 10*3/uL — ABNORMAL LOW (ref 150–400)
Promyelocytes Relative: 0 %
RBC: 2.49 MIL/uL — ABNORMAL LOW (ref 4.22–5.81)
RDW: 21.4 % — ABNORMAL HIGH (ref 11.5–15.5)
WBC: 11 10*3/uL — ABNORMAL HIGH (ref 4.0–10.5)
nRBC: 0 /100 WBC
nRBC: 4.5 % — ABNORMAL HIGH (ref 0.0–0.2)

## 2020-01-28 LAB — COMPREHENSIVE METABOLIC PANEL
ALT: 132 U/L — ABNORMAL HIGH (ref 0–44)
AST: 235 U/L — ABNORMAL HIGH (ref 15–41)
Albumin: 1.8 g/dL — ABNORMAL LOW (ref 3.5–5.0)
Alkaline Phosphatase: 870 U/L — ABNORMAL HIGH (ref 38–126)
Anion gap: 15 (ref 5–15)
BUN: 37 mg/dL — ABNORMAL HIGH (ref 8–23)
CO2: 15 mmol/L — ABNORMAL LOW (ref 22–32)
Calcium: 5.5 mg/dL — CL (ref 8.9–10.3)
Chloride: 108 mmol/L (ref 98–111)
Creatinine, Ser: 2.42 mg/dL — ABNORMAL HIGH (ref 0.61–1.24)
GFR calc Af Amer: 29 mL/min — ABNORMAL LOW (ref >60)
GFR calc non Af Amer: 25 mL/min — ABNORMAL LOW (ref >60)
Glucose, Bld: 225 mg/dL — ABNORMAL HIGH (ref 70–99)
Potassium: 4.4 mmol/L (ref 3.5–5.1)
Sodium: 138 mmol/L (ref 135–145)
Total Bilirubin: 1.4 mg/dL — ABNORMAL HIGH (ref 0.3–1.2)
Total Protein: 5.9 g/dL — ABNORMAL LOW (ref 6.5–8.1)

## 2020-01-28 LAB — PREPARE RBC (CROSSMATCH)

## 2020-01-28 LAB — APTT: aPTT: 46 seconds — ABNORMAL HIGH (ref 24–36)

## 2020-01-28 LAB — RAPID URINE DRUG SCREEN, HOSP PERFORMED
Amphetamines: NOT DETECTED
Barbiturates: NOT DETECTED
Benzodiazepines: NOT DETECTED
Cocaine: NOT DETECTED
Opiates: NOT DETECTED
Tetrahydrocannabinol: NOT DETECTED

## 2020-01-28 LAB — ACETAMINOPHEN LEVEL: Acetaminophen (Tylenol), Serum: <10 ug/mL — ABNORMAL LOW (ref 10–30)

## 2020-01-28 LAB — LACTIC ACID, PLASMA
Lactic Acid, Venous: 5.8 mmol/L (ref 0.5–1.9)
Lactic Acid, Venous: 5.8 mmol/L (ref 0.5–1.9)

## 2020-01-28 LAB — MAGNESIUM: Magnesium: 1.9 mg/dL (ref 1.7–2.4)

## 2020-01-28 LAB — RETICULOCYTES
Immature Retic Fract: 40.8 % — ABNORMAL HIGH (ref 2.3–15.9)
RBC.: 2.49 MIL/uL — ABNORMAL LOW (ref 4.22–5.81)
Retic Count, Absolute: 53.3 10*3/uL (ref 19.0–186.0)
Retic Ct Pct: 2.1 % (ref 0.4–3.1)

## 2020-01-28 LAB — SODIUM, URINE, RANDOM: Sodium, Ur: 25 mmol/L

## 2020-01-28 LAB — POC SARS CORONAVIRUS 2 AG -  ED: SARS Coronavirus 2 Ag: NEGATIVE

## 2020-01-28 LAB — PROTIME-INR
INR: 2.1 — ABNORMAL HIGH (ref 0.8–1.2)
Prothrombin Time: 23.9 seconds — ABNORMAL HIGH (ref 11.4–15.2)

## 2020-01-28 LAB — CREATININE, URINE, RANDOM: Creatinine, Urine: 375.94 mg/dL

## 2020-01-28 MED ORDER — ACETAMINOPHEN 500 MG PO TABS
1000.0000 mg | ORAL_TABLET | Freq: Once | ORAL | Status: DC
  Filled 2020-01-28: qty 2

## 2020-01-28 MED ORDER — SODIUM CHLORIDE 0.9% FLUSH
3.0000 mL | Freq: Two times a day (BID) | INTRAVENOUS | Status: DC
  Administered 2020-01-28 – 2020-02-05 (×10): 3 mL via INTRAVENOUS

## 2020-01-28 MED ORDER — ORAL CARE MOUTH RINSE
15.0000 mL | Freq: Two times a day (BID) | OROMUCOSAL | Status: DC
  Administered 2020-01-29 – 2020-02-05 (×12): 15 mL via OROMUCOSAL

## 2020-01-28 MED ORDER — ACETAMINOPHEN 325 MG PO TABS
650.0000 mg | ORAL_TABLET | Freq: Four times a day (QID) | ORAL | Status: DC | PRN
  Administered 2020-01-29: 650 mg via ORAL
  Filled 2020-01-28: qty 2

## 2020-01-28 MED ORDER — LACTATED RINGERS IV BOLUS
1500.0000 mL | Freq: Once | INTRAVENOUS | Status: AC
  Administered 2020-01-28: 1500 mL via INTRAVENOUS

## 2020-01-28 MED ORDER — SODIUM CHLORIDE 0.9 % IV SOLN: INTRAVENOUS | Status: DC

## 2020-01-28 MED ORDER — SODIUM CHLORIDE 0.9 % IV SOLN
10.0000 mL/h | Freq: Once | INTRAVENOUS | Status: AC
  Administered 2020-01-28: 10 mL/h via INTRAVENOUS

## 2020-01-28 MED ORDER — SODIUM CHLORIDE 0.9% IV SOLUTION: Freq: Once | INTRAVENOUS | Status: AC

## 2020-01-28 MED ORDER — VANCOMYCIN HCL 1500 MG/300ML IV SOLN
1500.0000 mg | Freq: Once | INTRAVENOUS | Status: AC
  Administered 2020-01-28: 1500 mg via INTRAVENOUS
  Filled 2020-01-28: qty 300

## 2020-01-28 MED ORDER — SODIUM CHLORIDE 0.9 % IV SOLN
2.0000 g | Freq: Once | INTRAVENOUS | Status: AC
  Administered 2020-01-28: 2 g via INTRAVENOUS
  Filled 2020-01-28: qty 2

## 2020-01-28 MED ORDER — ACETAMINOPHEN 650 MG RE SUPP: 650.0000 mg | Freq: Four times a day (QID) | RECTAL | Status: DC | PRN

## 2020-01-28 MED ORDER — ACETAMINOPHEN 650 MG RE SUPP: 650.0000 mg | Freq: Once | RECTAL | Status: DC

## 2020-01-28 MED ORDER — SODIUM CHLORIDE 0.9 % IV BOLUS
500.0000 mL | Freq: Once | INTRAVENOUS | Status: AC
  Administered 2020-01-28: 500 mL via INTRAVENOUS

## 2020-01-28 MED ORDER — VANCOMYCIN HCL IN DEXTROSE 1-5 GM/200ML-% IV SOLN: 1000.0000 mg | Freq: Once | INTRAVENOUS | Status: DC

## 2020-01-28 MED ORDER — ACETAMINOPHEN 650 MG RE SUPP
650.0000 mg | Freq: Once | RECTAL | Status: AC
  Administered 2020-01-28: 650 mg via RECTAL
  Filled 2020-01-28: qty 1

## 2020-01-28 MED ORDER — CHLORHEXIDINE GLUCONATE CLOTH 2 % EX PADS
6.0000 | MEDICATED_PAD | Freq: Every day | CUTANEOUS | Status: DC
  Administered 2020-01-29 – 2020-02-05 (×8): 6 via TOPICAL

## 2020-01-28 MED ORDER — ACETAMINOPHEN 650 MG RE SUPP: 975.0000 mg | Freq: Once | RECTAL | Status: DC

## 2020-01-28 MED ORDER — CALCIUM GLUCONATE-NACL 1-0.675 GM/50ML-% IV SOLN
1.0000 g | Freq: Once | INTRAVENOUS | Status: AC
  Administered 2020-01-29: 1000 mg via INTRAVENOUS
  Filled 2020-01-28: qty 50

## 2020-01-28 NOTE — Progress Notes (Signed)
Pharmacy Antibiotic Note  Frank Daugherty is a 74 y.o. male admitted on 01/28/2020 with UTI.  Pharmacy has been consulted for cefepime dosing.  Plan: Cefepime 2 Gm IV q24h F/u scr/cultures  Weight: 158 lb (71.7 kg)  Temp (24hrs), Avg:101.2 F (38.4 C), Min:100.8 F (38.2 C), Max:101.5 F (38.6 C)  Recent Labs  Lab 01/28/20 1716 01/28/20 1941  WBC 11.0*  --   CREATININE 2.42*  --   LATICACIDVEN 5.8* 5.8*    CrCl cannot be calculated (Unknown ideal weight.).    No Active Allergies  Antimicrobials this admission: 2/18 cefepime >>  2/18 vancomycin   >> x1 ED  Dose adjustments this admission:   Microbiology results:  BCx:   UCx:    Sputum:    MRSA PCR:   Thank you for allowing pharmacy to be a part of this patient's care.  Dorrene German 01/28/2020 10:11 PM

## 2020-01-28 NOTE — ED Notes (Signed)
Tyrance Branigan, son of patient would like an update on his father, [phone].

## 2020-01-28 NOTE — H&P (Signed)
History and Physical    PLEASE NOTE THAT DRAGON DICTATION SOFTWARE WAS USED IN THE CONSTRUCTION OF THIS NOTE.   Frank Daugherty OAC:[id number] DOB: 31-Aug-1946 DOA: 01/28/2020  PCP: Patient, No Pcp Per Patient coming from: [facility]  I have personally briefly reviewed patient's old medical records in [place]  Chief Complaint: fever  HPI: Frank Daugherty is a 74 y.o. 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   In the setting of patient's acute encephalopathy, the following history was provided by EMS, the ED physician as well as via chart review.   The patient was hospitalized at [facility] in January 2021 for malignancy work-up reportedly revealing metastasis to the bone as well as potential hepatic metastasis, although primary cancer is not currently clear to me. He reportedly developed A fib RVR during this previous hospitalization, which was apparently a new dx for hip. He was started on Toprol, but following discussions regarding risks vs benefits of starting chronic anti-coagulation for thrombo-embolic prophylaxis, the decision was made to refrain from chronic anticoagulation in the setting coagulopathy due to suspected metastatic disease to the liver. He was subsequently discharged to [facility] to temporary rehab before developing a fever of 101 earlier on the morning of 01/27/20. EMS was called and reportedly noted systolic BP in the 01'U with MAP less than 65 mmHg. Patient subsequently was brought to [facility] ED for further evaluation of new on-set fever as well as hypotension.   ED physician discussed patient's case with the patient's son, who conveyed that the patient has  seemed confused relative to baseline over the last 2 days and seemed more somnolent relative to baseline over that timeframe as well. No known recent melena or hematochezia. No known N/V. In terms of Daugherty status, patient's son conveyed that the patient would want CPR but would not want to be intubated under any circumstances.   Past medical history notable for chronic anemia with baseline Hgb 8-10, which is felt to be multi-factorial with hypoproliferative contributions in the setting of metastatic disease to the bone as well as hepatic insufficiency.   No recent traveling or known COVID-19 exposures.     ED Course:  Vital signs in the ED were notable for the following: Tmax 101,5, HR110-111; initial BP 82/68, which improved to 117/100 following IVF's as described below. RR 21; O2 94-100% on RA.   Labs were notable for the following: CMP notable for creatinine 2.42 relative to 0.8 on 01/13/20, calcium 5.5, albumin 1.8, alk phos 870 relative to 529 on 01/14/20 and relative to highest recent value of 974 on 01/05/20; AST 235 relative to 42 on 01/14/20; ALT 132 relative to 33 on 01/14/20, and T bili 1.4 relative to most recent value of 1.0 on 01/14/20, and compared to highest recent data point of 3.0 on 01/05/20. CBC notable for wbc of 11, Hgb 6.7 compared to most recent prior value of 8.5 on 01/14/20, platelets 135. INR 2.1. UA notable for greater than 50 wbc, large LE, many bacteria, SG of 1.020, and hyaline casts noted. LA 5.8, with repeat value unchanged at 5.8 following interval IVF's, as below.  Blood cx's x 2 collected prior to initiation of abx. Rapid COVID Ag negative. Nasopharyngeal  COVID-19 PCR collected, with result currently pending.   CXR shows no evidence of infiltrate, edema, or effusion.  While in the ED, the following were administered: IV NS x 1 L bolus, LR x 1.5 L bolus, Cefepime 2 g IV x 1, dose of IV Vanc, acetaminophen 650 mg PR x 1. Additionally, patient was typed and screened for 1 unit  PRBC, and transfusion initiated.      Review of Systems: As per HPI otherwise 10 point review of systems negative.   Past Medical History:  Diagnosis Date  . Chronic anemia   . Hypertension     No past surgical history on file.  Social History:  reports that he has never smoked. He has never used smokeless tobacco. He reports current alcohol use of about 4.0 standard drinks of alcohol per week. He reports that he does not use drugs.   No Active Allergies   Family History: Family history reviewed and not pertinent.    Prior to Admission medications   Medication Sig Start Date End Date Taking? Authorizing Provider  ascorbic acid (VITAMIN C) 500 MG tablet Take 500 mg by mouth daily.   Yes [provider]  metoprolol succinate (TOPROL-XL) 100 MG 24 hr tablet Take 1 tablet (100 mg total) by mouth daily. Take with or immediately following a meal. 01/14/20 02/13/20 Yes Hollice Gong, Mir Mohammed, MD  tamsulosin (FLOMAX) 0.4 MG CAPS capsule Take 1 capsule (0.4 mg total) by mouth daily after supper. 01/13/20 02/12/20 Yes Hollice Gong, Mir Mohammed, MD  pravastatin (PRAVACHOL) 40 MG tablet Take 1 tablet (40 mg total) by mouth daily. Patient not taking: Reported on 01/05/2020 04/23/12 04/23/13  Deneise Lever, MD     Objective    Physical Exam: Vitals:   01/28/20 1830 01/28/20 1900 01/28/20 1940 01/28/20 2025  BP: (!) 82/68 (!) 117/100 (!) 164/123   Pulse:   (!) 110   Resp: (!) 37  (!) 21   Temp:    (!) 100.8 F (38.2 C)  TempSrc:    Oral  SpO2:   100%     General: appears to be stated age; somnolent; will briefly open eyes to verbal stimuli; but is non-verbal.  Skin: warm, dry, no rash Head:  AT/[place] Eyes:  PEARL b/l, EOMI Mouth:  Oral mucosa membranes appear dry, normal dentition Neck: supple; trachea midline Heart:  RRR; did not appreciate any M/R/G Lungs: CTAB, did not appreciate any wheezes, rales, or rhonchi Abdomen: + BS; soft, ND, NT Extremities: no peripheral  edema, no muscle wasting Neuro: in the setting of acute encephalopathy, unable to perform full neurologic exam, including unable to fully assess strength, sensation, or CN's.   Labs on Admission: I have personally reviewed following labs and imaging studies  CBC: Recent Labs  Lab 01/28/20 1716  WBC 11.0*  NEUTROABS PENDING  HGB 6.7*  HCT 21.0*  MCV 84.3  PLT 324*   Basic Metabolic Panel: Recent Labs  Lab 01/28/20 1716  NA 138  K 4.4  CL 108  CO2 15*  GLUCOSE 225*  BUN 37*  CREATININE 2.42*  CALCIUM 5.5*   GFR: CrCl cannot be calculated (Unknown ideal weight.). Liver Function Tests: Recent Labs  Lab 01/28/20 1716  AST 235*  ALT 132*  ALKPHOS 870*  BILITOT 1.4*  PROT 5.9*  ALBUMIN 1.8*   No results for input(s): LIPASE, AMYLASE in the last 168 hours. No results for input(s): AMMONIA in the last 168 hours. Coagulation Profile: Recent Labs  Lab 01/28/20 1716  INR 2.1*   Cardiac Enzymes: No results for input(s): CKTOTAL, CKMB, CKMBINDEX, TROPONINI in the last 168 hours. BNP (last 3 results) No results for input(s): PROBNP in the last 8760 hours. HbA1C: No results for input(s): HGBA1C in the last 72 hours. CBG: No results for input(s): GLUCAP in the last 168 hours. Lipid Profile: No results for input(s): CHOL, HDL, LDLCALC, TRIG, CHOLHDL, LDLDIRECT in the last 72 hours. Thyroid Function Tests: No results for input(s): TSH, T4TOTAL, FREET4, T3FREE, THYROIDAB in the last 72 hours. Anemia Panel: No results for input(s): VITAMINB12, FOLATE, FERRITIN, TIBC, IRON, RETICCTPCT in the last 72 hours. Urine analysis:    Component Value Date/Time   COLORURINE AMBER (A) 01/28/2020 1754   APPEARANCEUR TURBID (A) 01/28/2020 1754   LABSPEC 1.020 01/28/2020 1754   PHURINE 5.0 01/28/2020 1754   GLUCOSEU NEGATIVE 01/28/2020 1754   HGBUR LARGE (A) 01/28/2020 1754   BILIRUBINUR NEGATIVE 01/28/2020 1754   KETONESUR 5 (A) 01/28/2020 1754   PROTEINUR 100 (A) 01/28/2020  1754   NITRITE NEGATIVE 01/28/2020 1754   LEUKOCYTESUR LARGE (A) 01/28/2020 1754    Radiological Exams on Admission: DG Chest Port 1 View  Result Date: 01/28/2020 CLINICAL DATA:  Fever. EXAM: PORTABLE CHEST 1 VIEW COMPARISON:  January 05, 2020 FINDINGS: Mild cardiomegaly. The hila and mediastinum are unremarkable. No pneumothorax. No nodules or masses. No focal infiltrates. Mild interstitial prominence without overt edema. IMPRESSION: Mild interstitial prominence could be due to the portable technique versus pulmonary venous congestion. No focal infiltrates or other acute abnormalities. Electronically Signed   By: Dorise Bullion III M.D   On: 01/28/2020 17:37     EKG: Independently reviewed, with result as described above.    Assessment/Plan    Frank Daugherty is a 74 y.o. 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    Principal Problem:   Severe sepsis with septic shock (HCC) Active Problems:   Coagulopathy (HCC)   Acute lower UTI   Elevated lactic acid level   Acute on chronic anemia   Acute metabolic encephalopathy   AKI (acute kidney injury) ([place])   Hypocalcemia   Transaminitis   #) Septic Shock due to UTI: dx on the basis of presenting objective fever, with UA showing greater than 50 wbc with large LE and many bacteria. SIRS critieria met via objective fever, tachycardia, leukocytosis, and criteria met for septic shock with presenting LA found to be greater than 4, specifically noted to 5.8. Received 2.5 L of of IVF bolus in the ED, which exceeded 30 mL/kg volume, and repeat LA noted to be unchanged at 5.8. No additional source of infxn identified at this time, including CXR  showing no acute process. Of note, COVID-19 PCR is currently pending. Received IV Vanc and Cefepime in the ED after collection of blood cultures x 2.  Of note, presenting blood pressure of 82/68 has improved to 117/100 following IVF's and initiation of abx.   Plan: Will continue Cefepime, but will refrain from additional IV Vanc at this time. Monitor for results of blood cx's x 2 as well as urine cx. NS @ 75 cc/hr for now, as patient will concomitantly be receiving transfusion of 1 unit PRBC as described below. Repeat LA at 0100  on 01/29/20. Repeat cbc with diff in the AM. Monitoring on tele. Monitor for result of COVID-19 PCR.      #) Acute on Chronic Anemia: in setting of chronic anemia with baseline hgb 8-10, believed to be multi-factorial with hypoproliferative contributions in the setting of metastatic disease to the bone as well as hepatic insufficiency, presenting Hgb noted to be 6.7, relative to most recent prior value of 8.5 on 01/14/20. No obvious source of bleeding. Potentially acute exacerbation in setting of severe sepsis as above vs worsening anemic contributions from bony mets / hepatic insufficiency. CBC today a/w normocytic and normochromic findings. In the ED, patient was typed and screened for 1 unit PRBC the transfusion of which was initiated. Of note, B12 and MMA level was recently rechecked and found to be within normal limits.   Plan: continue transfusion of aforementioned 1 unit PRBC, with plan to repeat Hgb with AM labs, which should occur shortly after completion of slow transfusion of this 1 unit PRBC. Repeat Hgb check has also been ordered to occur at 0900 on 01/29/20. I have also ordered 2 more units PRBC to be typed, screened, and held. Monitor on tele. Add-on iron studies. Check retic count. Check INR. Will also attempt additional chart review to gain additional insight into status of patient's malignancy work-up and management, as the details of such, including primary cancer, as  not currently clear to me.      #) Acute metabolic encephalopathy: 1-2 days of confusion and somnolence relative to baseline, per report of patient's son, as above. Suspect that this is multifactorial with contribution from severe sepsis to UTI, as above, as well as physiologic stress from metastatic disease.   Plan: work-up and management of severe sepsis due to UTI, as above. Check TSH, UDS. Will also check ammonia level to at least establish as baseline in the setting of liver disease. Repeat CMP in the AM. Will keep NPO until mental status improves sufficiently that patient is able to participate in and pass nursing bedside swallow screen.      #) Acute kidney injury: presenting creatinine noted to be 2.42 relative to most recent prior value of 0.8 on 01/13/20. Suspect that this is prerenal in nature due to intravascular depletion in the setting of acute on chronic anemia, severe sepsis, and dehydration, in addition to diminished renal perfusion on the setting of presenting hypotension. UA reveal hyaline casts, which is consistent with suspected dehydration. Differential also includes hepatorenal syndrome, although this represents a dx of exclusion and will be further evaluated to renal response to intra-vascular repletion and improved perfusion.   Plan: add-on random urine Na and Cr. Monitor strict I&O's and daily weights. Attempt to avoid nephrotoxic agents. IVF's as above. Work-up and management of acute on chronic anemia, as above. Repeat CMP in the morning.      #) Acute transaminitis: without over cholestatic pattern, as further quantified above. May represent progression inflammatory response to suspected metastatic disease to the liver vs early shock liver in setting of presenting hypotension, without sufficient elapsed time to allow for the typical more elevated transaminases a/w with such a process. Pharmacologic vs recreational toxins felt to be less likely, but will check serum  acetaminophen level and UDS to further evaluate.   Plan: Repeat CMP in the AM, and can consider necessity of updated abdominal imaging in the setting of these data points and overall clinical trend. Check serum acetaminophen level and UDS. Check INR. Check direct bili. Will check acute  viral hepatitis panel, although this process is felt to be less likely at the present time.      #) Coagulopathy: on the basis of suspected hepatic insufficiency due to metastatic disease, as further described above. Presenting INR noted to be 2.1. Not on any blood thinning agents.   Plan: refrain from pharmacologic anti-coagulation. Repeat INR in the AM.      #) Hypocalcemia: presenting non-adjusted serum calcium level found to be 5.5, with adjustment for hypoalbuminemia demonstrating adjusted calcium level of 7.1. Add-on magnesium level found to be 1.9.   Plan: Calcium gluconate 1g IV over 1 hour x 1 now, with repeat CMP and serum Mg level in the morning. Check ionized calcium.      #) Paroxysmal atrial fibrillation: documented history of such. In the setting of a CHA2DS2-VASc score of 2, there is an indication for the patient to be on chronic anticoagulation for thromboembolic prophylaxis. However, in the setting of coagulopathy, risks vs benefits discussions during previous hospitalization reportedly lead to decision to refrain from anti-coagulation. Home AV nodal blocking regimen: Toprol XL 100 mg PO Qdaily.  Sinus tachycardia at time of this evening's presentation. In the setting of presenting hypotension and acute on chronic anemia, as described above, will hold home Toprol XL for such with reassessment for resumption to occur in the morning.  Plan: Hold Toprol XL for now. monitor strict I's & O's and daily weights. Repeat BMP in the morning. Repeat serum magnesium level in the AM. Will continue to abstain from anti-coagulation, as above. INR in the morning. Monitor on tele.     DVT prophylaxis: scd's   Daugherty Status: per discussions with patient's son this evening, patient amenable to CPR, but would not want to be intubated under any circumstances. Family Communication: case discussed with son, as above Disposition Plan: Per Rounding Team Consults called: none  Admission status: inpatient; stepdown unit.    PLEASE NOTE THAT DRAGON DICTATION SOFTWARE WAS USED IN THE CONSTRUCTION OF THIS NOTE.   [facility] Pager [phone] From [place].   Otherwise, please contact night-coverage  [email or web address] Password [facility]  01/28/2020, 8:34 PM

## 2020-01-28 NOTE — Progress Notes (Signed)
A consult was received from an ED physician for Vancomycin & Cefepime per pharmacy dosing.  The patient's profile has been reviewed for ht/wt/allergies/indication/available labs.   A one time order has been placed for Cefepime 2gm & Vancomycin 1500mg  IV.  Further antibiotics/pharmacy consults should be ordered by admitting physician if indicated.                       Thank you, Netta Cedars, PharmD 01/28/2020  4:52 PM

## 2020-01-28 NOTE — ED Notes (Signed)
Date and time results received: 01/28/20 6:58 PM  (use smartphrase ".now" to insert current time)  Test: hgb  Critical Value: 6.7  Name of Provider Notified: Vanita Panda  Orders Received? Or Actions Taken?:

## 2020-01-28 NOTE — ED Notes (Signed)
Pt able to swallow PO meds with apple sauce

## 2020-01-28 NOTE — ED Triage Notes (Signed)
Pt arrived [facility] from [facility] place CC weakness and fever X unknown days.    Per EMS 100.4, tachy, resp 40, A/OX4 COVID neg 2/12  Hx. Foley cath blood noted in bag per EMS. Afib controlled with medication. Anemia    1200 ml NS given in route 20G RAC insert by facility today  EMS  20G Left hand in place by EMS

## 2020-01-28 NOTE — ED Provider Notes (Signed)
[facility] DEPT Provider Note   CSN: [id number] Arrival date & time: 01/28/20  [id number]     History Chief Complaint  Patient presents with  . Weakness  . Fever    Frank Daugherty is a 74 y.o. male.  HPI    Patient presents to nursing facility with staff concern of hypotension, fever.  Patient has multiple medical issues, but he cannot describe any of them, nor his current presentation.  Level 5 caveat secondary to confusion. History obtained by EMS providers and chart review.  Seems as though over the past day the patient has become hypotensive, febrile at a rehabilitation facility. He recently was diagnosed with malignancy in bone, additional details are not known yet. EMS reports that on their arrival the patient was tachycardic, with a heart rate greater than 100, hypotensive with a map below 65, and febrile.  He received IV fluids and heart rate diminished, but blood pressure did not change appreciably. No report of hypoxia in route.  The patient himself mumbles responses to questions, seemingly acquiesces when asked if we can evaluate him, but otherwise distinct answers to question such as pain, comfort, dyspnea are not interpretable. Past Medical History:  Diagnosis Date  . Hypertension     Patient Active Problem List   Diagnosis Date Noted  . 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 Abnormal liver function tests   . Abnormal serum level of alkaline phosphatase   . Abnormal MRI of abdomen   . Atrial fibrillation with RVR ([place]) 01/05/2020  . Renal insufficiency 01/05/2020  . Coagulopathy ([place]) 01/05/2020  . Hyperglycemia 01/05/2020  . HTN (hypertension) 04/23/2012  . HLD (hyperlipidemia) 04/23/2012    No past surgical history on file.     Family History  Family history unknown: Yes    Social History   Tobacco Use  . Smoking status: Never Smoker  . Smokeless tobacco: Never Used  Substance Use Topics  . Alcohol use: Yes   Alcohol/week: 4.0 standard drinks    Types: 4 Cans of beer per week  . Drug use: Never    Home Medications Prior to Admission medications   Medication Sig Start Date End Date Taking? Authorizing Provider  metoprolol succinate (TOPROL-XL) 100 MG 24 hr tablet Take 1 tablet (100 mg total) by mouth daily. Take with or immediately following a meal. 01/14/20 02/13/20  Hollice Gong, Mir Mohammed, MD  pravastatin (PRAVACHOL) 40 MG tablet Take 1 tablet (40 mg total) by mouth daily. Patient not taking: Reported on 01/05/2020 04/23/12 04/23/13  Deneise Lever, MD  tamsulosin (FLOMAX) 0.4 MG CAPS capsule Take 1 capsule (0.4 mg total) by mouth daily after supper. 01/13/20 02/12/20  Tomma Rakers, MD    Allergies    Patient has no active allergies.  Review of Systems   Review of Systems  Unable to perform ROS: Mental status change    Physical Exam Updated Vital Signs SpO2 94%   Physical Exam Vitals and nursing note reviewed.  Constitutional:      Appearance: He is well-developed. He is ill-appearing.     Comments: Uncomfortable appearing ill adult male  HENT:     Head: Normocephalic and atraumatic.  Eyes:     Conjunctiva/sclera: Conjunctivae normal.  Cardiovascular:     Rate and Rhythm: Regular rhythm. Tachycardia present.  Pulmonary:     Effort: Tachypnea present.     Breath sounds: No wheezing.  Abdominal:     General: There is no distension.  Skin:  General: Skin is warm and dry.  Neurological:     Mental Status: He is alert.     Comments: Diffuse atrophy.  Patient does move all extremities spontaneously, but does not follow commands reliably.  No gross facial asymmetry, speech is unintelligible.  Psychiatric:        Cognition and Memory: Cognition is impaired.     ED Results / Procedures / Treatments   Labs (all labs ordered are listed, but only abnormal results are displayed) Labs Reviewed  LACTIC ACID, PLASMA - Abnormal; Notable for the following components:       Result Value   Lactic Acid, Venous 5.8 (*)    All other components within normal limits  COMPREHENSIVE METABOLIC PANEL - Abnormal; Notable for the following components:   CO2 15 (*)    Glucose, Bld 225 (*)    BUN 37 (*)    Creatinine, Ser 2.42 (*)    Calcium 5.5 (*)    Total Protein 5.9 (*)    Albumin 1.8 (*)    AST 235 (*)    ALT 132 (*)    Alkaline Phosphatase 870 (*)    Total Bilirubin 1.4 (*)    GFR calc non Af Amer 25 (*)    GFR calc Af Amer 29 (*)    All other components within normal limits  CBC WITH DIFFERENTIAL/PLATELET - Abnormal; Notable for the following components:   WBC 11.0 (*)    RBC 2.49 (*)    Hemoglobin 6.7 (*)    HCT 21.0 (*)    RDW 21.4 (*)    Platelets 135 (*)    nRBC 4.5 (*)    All other components within normal limits  APTT - Abnormal; Notable for the following components:   aPTT 46 (*)    All other components within normal limits  PROTIME-INR - Abnormal; Notable for the following components:   Prothrombin Time 23.9 (*)    INR 2.1 (*)    All other components within normal limits  URINALYSIS, ROUTINE W REFLEX MICROSCOPIC - Abnormal; Notable for the following components:   Color, Urine AMBER (*)    APPearance TURBID (*)    Hgb urine dipstick LARGE (*)    Ketones, ur 5 (*)    Protein, ur 100 (*)    Leukocytes,Ua LARGE (*)    RBC / HPF >50 (*)    WBC, UA >50 (*)    Bacteria, UA MANY (*)    All other components within normal limits  CULTURE, BLOOD (ROUTINE X 2)  CULTURE, BLOOD (ROUTINE X 2)  URINE CULTURE  LACTIC ACID, PLASMA  POC SARS CORONAVIRUS 2 AG -  ED  TYPE AND SCREEN  PREPARE RBC (CROSSMATCH)    EKG EKG Interpretation  Date/Time:  Thursday January 28 2020 17:01:50 EST Ventricular Rate:  111 PR Interval:    QRS Duration: 88 QT Interval:  376 QTC Calculation: 511 R Axis:   -6 Text Interpretation: Sinus tachycardia Multiform ventricular premature complexes Prolonged QT interval Artifact Abnormal ECG Confirmed by Carmin Muskrat  [phone]) on 01/28/2020 5:08:11 PM   Radiology DG Chest Port 1 View  Result Date: 01/28/2020 CLINICAL DATA:  Fever. EXAM: PORTABLE CHEST 1 VIEW COMPARISON:  January 05, 2020 FINDINGS: Mild cardiomegaly. The hila and mediastinum are unremarkable. No pneumothorax. No nodules or masses. No focal infiltrates. Mild interstitial prominence without overt edema. IMPRESSION: Mild interstitial prominence could be due to the portable technique versus pulmonary venous congestion. No focal infiltrates or other acute abnormalities. Electronically  Signed   By: Dorise Bullion III M.D   On: 01/28/2020 17:37    Procedures Procedures (including critical care time)  CRITICAL CARE Performed by: Carmin Muskrat Total critical care time: 40 minutes Critical care time was exclusive of separately billable procedures and treating other patients. Critical care was necessary to treat or prevent imminent or life-threatening deterioration. Critical care was time spent personally by me on the following activities: development of treatment plan with patient and/or surrogate as well as nursing, discussions with consultants, evaluation of patient's response to treatment, examination of patient, obtaining history from patient or surrogate, ordering and performing treatments and interventions, ordering and review of laboratory studies, ordering and review of radiographic studies, pulse oximetry and re-evaluation of patient's condition.   Medications Ordered in ED Medications  ceFEPIme (MAXIPIME) 2 g in sodium chloride 0.9 % 100 mL IVPB (has no administration in time range)  vancomycin (VANCOCIN) IVPB 1000 mg/200 mL premix (has no administration in time range)  acetaminophen (TYLENOL) tablet 1,000 mg (has no administration in time range)  lactated ringers bolus 1,500 mL (has no administration in time range)    ED Course  I have reviewed the triage vital signs and the nursing notes.  Pertinent labs & imaging results that were  available during my care of the patient were reviewed by me and considered in my medical decision making (see chart for details).  With consideration of sepsis in this adult male with multiple medical issues, now presenting with hypotension, fever, patient will continue to receive fluid resuscitation, 1.5 L added to the 1 L he received in the ambulance for a total of greater than 30 mL/kg.  Empiric antibiotics started as well.  Update:, Patient found to have anemia with a hemoglobin 6.7. Patient has been baseline low recently, but this is lower in the context of sepsis, patient will receive blood transfusion.  8:11 PM Patient has received fluid resuscitation sepsis bundle, initial antibiotics.  Remaining initial labs notable for lactic acidosis, acute renal injury, hepatic dysfunction.  Patient also found to have likely urinary tract infection which may be contributing to his sepsis.  I discussed the patient's presentation, results with his son who confirms that the patient would be interested in CPR, but would not be interested in intubation or artificial prolonged life.  This adult male with ongoing evaluation for malignancy, likely hematologic with known bone metastases presents with signs and symptoms consistent with sepsis.  Patient received empiric antibiotics, required blood, required admission for further monitoring, management.  Final Clinical Impression(s) / ED Diagnoses Final diagnoses:  Severe sepsis [facility])     Carmin Muskrat, MD 01/28/20 2013

## 2020-01-29 ENCOUNTER — Inpatient Hospital Stay (HOSPITAL_COMMUNITY): Payer: Medicare Other

## 2020-01-29 DIAGNOSIS — R937 Abnormal findings on diagnostic imaging of other parts of musculoskeletal system: Secondary | ICD-10-CM

## 2020-01-29 DIAGNOSIS — R6521 Severe sepsis with septic shock: Secondary | ICD-10-CM

## 2020-01-29 DIAGNOSIS — N179 Acute kidney failure, unspecified: Secondary | ICD-10-CM

## 2020-01-29 DIAGNOSIS — A419 Sepsis, unspecified organism: Secondary | ICD-10-CM

## 2020-01-29 DIAGNOSIS — B952 Enterococcus as the cause of diseases classified elsewhere: Secondary | ICD-10-CM

## 2020-01-29 DIAGNOSIS — R7881 Bacteremia: Secondary | ICD-10-CM

## 2020-01-29 DIAGNOSIS — K769 Liver disease, unspecified: Secondary | ICD-10-CM

## 2020-01-29 LAB — COMPREHENSIVE METABOLIC PANEL
ALT: 139 U/L — ABNORMAL HIGH (ref 0–44)
AST: 217 U/L — ABNORMAL HIGH (ref 15–41)
Albumin: 2.1 g/dL — ABNORMAL LOW (ref 3.5–5.0)
Alkaline Phosphatase: 979 U/L — ABNORMAL HIGH (ref 38–126)
Anion gap: 16 — ABNORMAL HIGH (ref 5–15)
BUN: 41 mg/dL — ABNORMAL HIGH (ref 8–23)
CO2: 14 mmol/L — ABNORMAL LOW (ref 22–32)
Calcium: 6 mg/dL — CL (ref 8.9–10.3)
Chloride: 110 mmol/L (ref 98–111)
Creatinine, Ser: 2.14 mg/dL — ABNORMAL HIGH (ref 0.61–1.24)
GFR calc Af Amer: 34 mL/min — ABNORMAL LOW (ref >60)
GFR calc non Af Amer: 29 mL/min — ABNORMAL LOW (ref >60)
Glucose, Bld: 101 mg/dL — ABNORMAL HIGH (ref 70–99)
Potassium: 4.3 mmol/L (ref 3.5–5.1)
Sodium: 140 mmol/L (ref 135–145)
Total Bilirubin: 3.2 mg/dL — ABNORMAL HIGH (ref 0.3–1.2)
Total Protein: 6.5 g/dL (ref 6.5–8.1)

## 2020-01-29 LAB — HEPATITIS PANEL, ACUTE
HCV Ab: NONREACTIVE
Hep A IgM: NONREACTIVE
Hep B C IgM: NONREACTIVE
Hepatitis B Surface Ag: NONREACTIVE

## 2020-01-29 LAB — MAGNESIUM: Magnesium: 1.9 mg/dL (ref 1.7–2.4)

## 2020-01-29 LAB — CBC WITH DIFFERENTIAL/PLATELET
Abs Immature Granulocytes: 0.7 10*3/uL — ABNORMAL HIGH (ref 0.00–0.07)
Band Neutrophils: 12 %
Basophils Absolute: 0 10*3/uL (ref 0.0–0.1)
Basophils Relative: 0 %
Eosinophils Absolute: 0 10*3/uL (ref 0.0–0.5)
Eosinophils Relative: 0 %
HCT: 32.4 % — ABNORMAL LOW (ref 39.0–52.0)
Hemoglobin: 10.4 g/dL — ABNORMAL LOW (ref 13.0–17.0)
Lymphocytes Relative: 12 %
Lymphs Abs: 0.9 10*3/uL (ref 0.7–4.0)
MCH: 27.2 pg (ref 26.0–34.0)
MCHC: 32.1 g/dL (ref 30.0–36.0)
MCV: 84.8 fL (ref 80.0–100.0)
Metamyelocytes Relative: 4 %
Monocytes Absolute: 0.2 10*3/uL (ref 0.1–1.0)
Monocytes Relative: 2 %
Myelocytes: 5 %
Neutro Abs: 6 10*3/uL (ref 1.7–7.7)
Neutrophils Relative %: 65 %
Platelets: 122 10*3/uL — ABNORMAL LOW (ref 150–400)
RBC: 3.82 MIL/uL — ABNORMAL LOW (ref 4.22–5.81)
RDW: 18.6 % — ABNORMAL HIGH (ref 11.5–15.5)
WBC: 7.8 10*3/uL (ref 4.0–10.5)
nRBC: 5.4 % — ABNORMAL HIGH (ref 0.0–0.2)

## 2020-01-29 LAB — PROTIME-INR
INR: 2.2 — ABNORMAL HIGH (ref 0.8–1.2)
Prothrombin Time: 24.6 seconds — ABNORMAL HIGH (ref 11.4–15.2)

## 2020-01-29 LAB — URINE CULTURE

## 2020-01-29 LAB — BLOOD CULTURE ID PANEL (REFLEXED)
Acinetobacter baumannii: NOT DETECTED
Candida albicans: NOT DETECTED
Candida glabrata: NOT DETECTED
Candida krusei: NOT DETECTED
Candida parapsilosis: NOT DETECTED
Candida tropicalis: NOT DETECTED
Carbapenem resistance: NOT DETECTED
Enterobacter cloacae complex: NOT DETECTED
Enterobacteriaceae species: DETECTED — AB
Enterococcus species: DETECTED — AB
Escherichia coli: DETECTED — AB
Haemophilus influenzae: NOT DETECTED
Klebsiella oxytoca: NOT DETECTED
Klebsiella pneumoniae: NOT DETECTED
Listeria monocytogenes: NOT DETECTED
Neisseria meningitidis: NOT DETECTED
Proteus species: NOT DETECTED
Pseudomonas aeruginosa: NOT DETECTED
Serratia marcescens: NOT DETECTED
Staphylococcus aureus (BCID): NOT DETECTED
Staphylococcus species: NOT DETECTED
Streptococcus agalactiae: NOT DETECTED
Streptococcus pneumoniae: NOT DETECTED
Streptococcus pyogenes: NOT DETECTED
Streptococcus species: NOT DETECTED
Vancomycin resistance: NOT DETECTED

## 2020-01-29 LAB — AMMONIA: Ammonia: 15 umol/L (ref 9–35)

## 2020-01-29 LAB — ABO/RH: ABO/RH(D): B NEG

## 2020-01-29 LAB — IRON AND TIBC
Iron: 22 ug/dL — ABNORMAL LOW (ref 45–182)
Saturation Ratios: 13 % — ABNORMAL LOW (ref 17.9–39.5)
TIBC: 166 ug/dL — ABNORMAL LOW (ref 250–450)
UIBC: 144 ug/dL

## 2020-01-29 LAB — PSA: Prostatic Specific Antigen: 1101.59 ng/mL — ABNORMAL HIGH (ref 0.00–4.00)

## 2020-01-29 LAB — GLUCOSE, CAPILLARY: Glucose-Capillary: 123 mg/dL — ABNORMAL HIGH (ref 70–99)

## 2020-01-29 LAB — LACTIC ACID, PLASMA: Lactic Acid, Venous: 3.5 mmol/L (ref 0.5–1.9)

## 2020-01-29 LAB — BILIRUBIN, DIRECT: Bilirubin, Direct: 2 mg/dL — ABNORMAL HIGH (ref 0.0–0.2)

## 2020-01-29 LAB — SARS CORONAVIRUS 2 (TAT 6-24 HRS): SARS Coronavirus 2: NEGATIVE

## 2020-01-29 LAB — MRSA PCR SCREENING: MRSA by PCR: NEGATIVE

## 2020-01-29 LAB — TSH: TSH: 1.978 u[IU]/mL (ref 0.350–4.500)

## 2020-01-29 LAB — FERRITIN: Ferritin: >7500 ng/mL — ABNORMAL HIGH (ref 24–336)

## 2020-01-29 MED ORDER — ADULT MULTIVITAMIN W/MINERALS CH
1.0000 | ORAL_TABLET | Freq: Every day | ORAL | Status: DC
  Administered 2020-01-30 – 2020-02-05 (×7): 1 via ORAL
  Filled 2020-01-29 (×7): qty 1

## 2020-01-29 MED ORDER — PIPERACILLIN-TAZOBACTAM 3.375 G IVPB
3.3750 g | Freq: Three times a day (TID) | INTRAVENOUS | Status: DC
  Administered 2020-01-29 – 2020-01-31 (×6): 3.375 g via INTRAVENOUS
  Filled 2020-01-29 (×6): qty 50

## 2020-01-29 MED ORDER — SODIUM CHLORIDE 0.9 % IV SOLN
2.0000 g | Freq: Two times a day (BID) | INTRAVENOUS | Status: DC
  Administered 2020-01-29: 12:00:00 2 g via INTRAVENOUS
  Filled 2020-01-29: qty 2

## 2020-01-29 MED ORDER — LEUPROLIDE ACETATE 3.75 MG IM KIT
3.7500 mg | PACK | Freq: Once | INTRAMUSCULAR | Status: AC
  Administered 2020-01-30: 3.75 mg via INTRAMUSCULAR
  Filled 2020-01-29: qty 3.75

## 2020-01-29 MED ORDER — BICALUTAMIDE 50 MG PO TABS
50.0000 mg | ORAL_TABLET | Freq: Every day | ORAL | Status: DC
  Administered 2020-01-30 – 2020-02-05 (×7): 50 mg via ORAL
  Filled 2020-01-29 (×7): qty 1

## 2020-01-29 MED ORDER — SODIUM CHLORIDE 0.9 % IV SOLN: 2.0000 g | INTRAVENOUS | Status: DC

## 2020-01-29 MED ORDER — ENSURE ENLIVE PO LIQD
237.0000 mL | Freq: Two times a day (BID) | ORAL | Status: DC
  Administered 2020-01-29 – 2020-02-03 (×7): 237 mL via ORAL

## 2020-01-29 MED ORDER — SODIUM CHLORIDE 0.9% IV SOLUTION: Freq: Once | INTRAVENOUS | Status: AC

## 2020-01-29 MED ORDER — BOOST / RESOURCE BREEZE PO LIQD CUSTOM
1.0000 | Freq: Two times a day (BID) | ORAL | Status: DC
  Administered 2020-01-30 – 2020-02-05 (×12): 1 via ORAL

## 2020-01-29 MED ORDER — METOPROLOL TARTRATE 25 MG PO TABS
12.5000 mg | ORAL_TABLET | Freq: Two times a day (BID) | ORAL | Status: DC
  Administered 2020-01-29 – 2020-01-31 (×4): 12.5 mg via ORAL
  Filled 2020-01-29 (×4): qty 1

## 2020-01-29 MED ORDER — GADOBUTROL 1 MMOL/ML IV SOLN
7.0000 mL | Freq: Once | INTRAVENOUS | Status: AC | PRN
  Administered 2020-01-29: 7 mL via INTRAVENOUS

## 2020-01-29 MED ORDER — VANCOMYCIN HCL 1500 MG/300ML IV SOLN: 1500.0000 mg | INTRAVENOUS | Status: DC

## 2020-01-29 NOTE — Progress Notes (Signed)
Initial Nutrition Assessment  RD working remotely.   DOCUMENTATION CODES:   Not applicable  INTERVENTION:  - will order Boost Breeze BID, each supplement provides 250 kcal and 9 grams of protein. - will order Ensure Enlive BID, each supplement provides 350 kcal and 20 grams of protein. - will order daily multivitamin with minerals.    NUTRITION DIAGNOSIS:   Increased nutrient needs related to chronic illness, cancer and cancer related treatments, acute illness(sepsis/septic shock) as evidenced by estimated needs.  GOAL:   Patient will meet greater than or equal to 90% of their needs  MONITOR:   PO intake, Supplement acceptance, Labs, Weight trends  REASON FOR ASSESSMENT:   Consult Assessment of nutrition requirement/status  ASSESSMENT:   74 year old male with medical history of a. 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 He presented to the ED from [facility] with weakness, fever, and AMS x2 days. He was given 2.5L IV fluid in the ED. Patient admitted for septic shock 2/2 UTI.  Diet advanced from NPO to Regular today at ~0810. No intakes documented since that time. Patient is currently out of the room to CT.   Per chart review, weight today is 155 lb and weight on 2/4 was 159 lb. This indicates 4 lb weight loss (2.5% body weight) in the past 2 weeks; significant for time frame.   Patient was assessed by another RD on 2/1 and 2/2. Per review of those notes, patient did not like the hospital food and his wife was bringing items in for him during that admission.   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 Medications reviewed.   IVF; NS @ 75 ml/hr.    NUTRITION - FOCUSED PHYSICAL EXAM:  unable to complete at this time.   Diet Order:   Diet Order            Diet regular Room service appropriate? Yes; Fluid consistency: Thin  Diet effective now              EDUCATION NEEDS:   No education needs have been identified at this time  Skin:  Skin Assessment: Reviewed RN Assessment  Last BM:  2/18  Height:   Ht Readings from Last 1 Encounters:  01/29/20 '5\' 11"'  (1.803 m)    Weight:   Wt Readings from Last 1 Encounters:  01/29/20 70.4 kg    Ideal Body Weight:  78.2 kg  BMI:  Body mass index is 21.65 kg/m.  Estimated Nutritional Needs:   Kcal:  2200-2500 kcal  Protein:  110-125 grams  Fluid:  >/= 2.5 L/day     Jarome Matin, MS, RD, LDN, CNSC Inpatient Clinical Dietitian RD pager # available in [facility]  After hours/weekend pager # available in [facility]

## 2020-01-29 NOTE — Progress Notes (Addendum)
Pharmacy Antibiotic Note  Frank Daugherty is a 74 y.o. male admitted on 01/28/2020 with UTI.  Pharmacy has been consulted for cefepime dosing.  Received Vancomycin x1 2/18  2/19: BCID resulted 4/4 bottles with E coli, also Enterococcus Tmax 101.5, WBC 11 > 7.8, SCr 2.42 > 2.14 ( SCr 01/13/2020 was 0.8)  Plan: Addended 12N D2 abx, d/c Cef/Vanc [place] to Zosyn per ID, E coli and Enterococcus in BCx Zosyn EI, will continue SCr qd  Height: 5\' 11"  (180.3 cm) Weight: 155 lb 3.3 oz (70.4 kg) IBW/kg (Calculated) : 75.3  Temp (24hrs), Avg:99.1 F (37.3 C), Min:97.4 F (36.3 C), Max:101.5 F (38.6 C)  Recent Labs  Lab 01/28/20 1716 01/28/20 1941 01/29/20 0100 01/29/20 0718  WBC 11.0*  --   --  7.8  CREATININE 2.42*  --   --  2.14*  LATICACIDVEN 5.8* 5.8* 3.5*  --     Estimated Creatinine Clearance: 30.2 mL/min (A) (by C-G formula based on SCr of 2.14 mg/dL (H)).    No Active Allergies  Antimicrobials this admission: 2/18 cefepime >> 2/19 2/18 vancomycin >> 2/19 2/19 Zosyn >>  Dose adjustments this admission: 2/19 Cefepime 2gm q24 >> q12 with improving renal fx  Microbiology results: 2/18 MRSA PCR: neg 2/18 UCx: sent 2/18 BCx: 4/4 E coli, Enterococcus (no Vanc resis) Thank you for allowing pharmacy to be a part of this patient's care.  Minda Ditto PharmD 01/29/2020 9:07 AM

## 2020-01-29 NOTE — Progress Notes (Signed)
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  In the ED, temperature one 1.5, HR 111, BP 82/68, RR 21, SPO2 94% on room air.  Creatinine 2.42 (was 0.8 on 01/13/2020), calcium 5.5 corrected for albumin of 1.827.3.  Alkaline phosphatase 870, AST 235, ALT 132, total bilirubin 1.4, WBC count 11, hemoglobin 6.7, platelets 135, INR 2.1, urinalysis with large leukoesterase, many bacteria, negative nitrite, greater than 50 WBCs.  Chest x-ray with no acute cardiopulmonary disease process.  Patient was given 2.5 L NS bolus, started on cefepime and vancomycin and 1 unit PRBC ordered for transfusion.  Patient referred for admission for septic shock secondary to urinary source.   Assessment & Plan:   Principal Problem:   Severe sepsis with septic shock (HCC) Active Problems:   Coagulopathy (HCC)   Acute lower UTI   Elevated lactic acid level   Acute on chronic anemia   Acute metabolic encephalopathy   AKI (acute kidney injury) ([place])   Hypocalcemia   Transaminitis   Septic shock; present on admission E. coli, Enterococcus septicemia Patient presenting from SNF with 2-day history of worsening mental status, fever, and weakness.  Patient was febrile up to 1-1.5 with a blood pressure of 73/36 on admission.  Elevated lactic acid of 5.8 with underlying AKI and likely shock liver.  Blood cultures positive for Enterococcus and E. coli.  Received 2.5 L NS bolus in ED. --WBC 11.0-->7.8 --Lactic acid 5.8-->3.5 --Continue empiric antibiotics with vancomycin and cefepime; pharmacy consulted for assistance with dosing/monitoring  --Exchange Foley catheter --Continue NS at 75 mL's per hour --Await finalization of blood cultures with susceptibilities prior to de-escalation of antibiotics --Continue supportive care, antiemetics, antipyretics --Continue close monitoring of blood pressure; improved to 108/78 this morning --Follow CBC daily, repeat lactic acid in a.m.  Elevated LFTs likely secondary to shock liver Elevated bilirubin Patient was noted to have an elevated AST of 235 and ALT of 132 on presentation with a total bilirubin of 1.4.  Etiology likely secondary to shock liver with significant hypotension in setting of sepsis as above. --AST 235-->217 --ALT 132-->139 --Tbili 1.4-->3.2 --Acute hepatitis panel: Pending --Continue to trend CMP daily --Continue IV antibiotics, IV fluids and supportive care as above  Acute renal failure Creatinine 2.42 on presentation. FeNA 0.1%, consistent with prerenal.  Also suspect possible some ATN secondary to septic shock with hypotension as above.  Complicated by chronic indwelling Foley catheter. --Cr 2.42-->2.14 --Exchange Foley catheter --Avoid nephrotoxins, renally dose all medications --Continue IV fluids with NS at 75 mL's per hour --Follow renal function daily  Paroxysmal atrial fibrillation CHA2DS2-VASc score of 2.  Given history of coagulopathy, risks versus benefits discussions were performed during previous hospitalization with decision to refrain from anticoagulation.  On Toprol-XL 100 mg p.o. daily at home. --Currently in NSR, continue monitor on telemetry --Continue to hold home Toprol until blood pressure improves  Acute on chronic anemia Baseline hemoglobin 8-10.  Etiology likely secondary to multifactorial with hypoproliferative in the setting of metastatic disease to the bone in addition to hepatic insufficiency.  Hemoglobin on presentation 6.7, most recent prior value of 8.5 on 01/14/2020.  No obvious signs of blood  loss/bleeding.  Iron level 22, TIBC 166,  ferritin greater than 7500. --s/p 1u pRBC on 2/18 --Hgb 6.7-->10.4  Coagulopathy INR elevated 2.2, likely secondary to suspected hepatic and physician due to metastatic disease.  No signs of active bleeding. --Continue to monitor INR and CBC daily  Hypocalcemia Calcium 5.5 on admission, corrected for albumin of 1.9 to 7.3.  Metastatic cancer to bone/liver with unknown primary Follows with medical oncology outpatient, Dr. Burr Medico.  Suspicion for prostate cancer as primary.  Weakness, debility: --PT/OT evaluation   DVT prophylaxis: SCDs Code Status: Partial code, okay for CPR, no intubation Family Communication: Updated patient's son [name] and ex-wife via telephone this morning Disposition Plan: Continue inpatient, ICU level of care   Consultants:   None  Procedures:   None  Antimicrobials:   Vancomycin 2/18>>  Cefepime 2/18>>   Subjective: Patient seen and examined bedside, resting comfortably.  No specific complaints this morning.  Appears mentation has markedly improved.  Request something to eat.  Denies headache, no chest pain, no shortness of breath, no abdominal pain.  No acute events overnight per nursing staff.  Objective: Vitals:   01/29/20 0443 01/29/20 0500 01/29/20 0600 01/29/20 0805  BP: 102/85 103/78 108/78 110/63  Pulse: (!) 101 99 100 (!) 108  Resp: 19 (!) 31 (!) 32 17  Temp: 99.6 F (37.6 C) 98.6 F (37 C)  99 F (37.2 C)  TempSrc: Oral Oral  Oral  SpO2: 97% 100% 100% 98%  Weight:      Height:        Intake/Output Summary (Last 24 hours) at 01/29/2020 1021 Last data filed at 01/29/2020 [id number] Gross per 24 hour  Intake 3781.15 ml  Output 425 ml  Net 3356.15 ml   Filed Weights   01/28/20 2211 01/29/20 0000  Weight: 71.7 kg 70.4 kg    Examination:  General exam: Appears calm and comfortable, thin in appearance Respiratory system: Clear to auscultation. Respiratory effort normal. Cardiovascular system: S1 & S2 heard, RRR. No JVD,  murmurs, rubs, gallops or clicks. No pedal edema. Gastrointestinal system: Abdomen is nondistended, soft and nontender. No organomegaly or masses felt. Normal bowel sounds heard. GU: Foley catheter noted in place draining clear yellow urine Central nervous system: Alert and oriented. No focal neurological deficits. Extremities: Symmetric 5 x 5 power. Skin: No rashes, lesions or ulcers Psychiatry: Judgement and insight appear normal. Mood & affect appropriate.     Data Reviewed: I have personally reviewed following labs and imaging studies  CBC: Recent Labs  Lab 01/28/20 1716 01/29/20 0718  WBC 11.0* 7.8  NEUTROABS 8.4* 6.0  HGB 6.7* 10.4*  HCT 21.0* 32.4*  MCV 84.3 84.8  PLT 135* 123XX123*   Basic Metabolic Panel: Recent Labs  Lab 01/28/20 1716 01/28/20 1738 01/29/20 0718  NA 138  --  140  K 4.4  --  4.3  CL 108  --  110  CO2 15*  --  14*  GLUCOSE 225*  --  101*  BUN 37*  --  41*  CREATININE 2.42*  --  2.14*  CALCIUM 5.5*  --  6.0*  MG  --  1.9 1.9   GFR: Estimated Creatinine Clearance: 30.2 mL/min (A) (by C-G formula based on SCr of 2.14 mg/dL (H)). Liver Function Tests: Recent Labs  Lab 01/28/20 1716 01/29/20 0718  AST 235* 217*  ALT 132* 139*  ALKPHOS 870* 979*  BILITOT 1.4* 3.2*  PROT 5.9* 6.5  ALBUMIN 1.8* 2.1*   No results for input(s): LIPASE,  AMYLASE in the last 168 hours. Recent Labs  Lab 01/29/20 0100  AMMONIA 15   Coagulation Profile: Recent Labs  Lab 01/28/20 1716 01/29/20 0100  INR 2.1* 2.2*   Cardiac Enzymes: No results for input(s): CKTOTAL, CKMB, CKMBINDEX, TROPONINI in the last 168 hours. BNP (last 3 results) No results for input(s): PROBNP in the last 8760 hours. HbA1C: No results for input(s): HGBA1C in the last 72 hours. CBG: No results for input(s): GLUCAP in the last 168 hours. Lipid Profile: No results for input(s): CHOL, HDL, LDLCALC, TRIG, CHOLHDL, LDLDIRECT in the last 72 hours. Thyroid Function Tests: Recent Labs     01/29/20 0100  TSH 1.978   Anemia Panel: Recent Labs    01/28/20 1716 01/28/20 2200  FERRITIN  --  >7,500*  TIBC  --  166*  IRON  --  22*  RETICCTPCT 2.1  --    Sepsis Labs: Recent Labs  Lab 01/28/20 1716 01/28/20 1941 01/29/20 0100  LATICACIDVEN 5.8* 5.8* 3.5*    Recent Results (from the past 240 hour(s))  Blood Culture (routine x 2)     Status: None (Preliminary result)   Collection Time: 01/28/20  5:08 PM   Specimen: BLOOD  Result Value Ref Range Status   Specimen Description   Final    BLOOD RIGHT ANTECUBITAL Performed at [facility], [place] [address], [place], [place] [address]    Special Requests   Final    BOTTLES DRAWN AEROBIC AND ANAEROBIC Blood Culture adequate volume Performed at [place] [address], [place], [place] [address]    Culture  Setup Time   Final    GRAM NEGATIVE RODS IN BOTH AEROBIC AND ANAEROBIC BOTTLES CRITICAL RESULT CALLED TO, READ BACK BY AND VERIFIED WITH: PHARMD MARY @0829  01/29/20 AKT Performed at [facility], [place] [address] [place], [place] [address]    Culture GRAM NEGATIVE RODS  Final   Report Status PENDING  Incomplete  Blood Culture ID Panel (Reflexed)     Status: Abnormal   Collection Time: 01/28/20  5:08 PM  Result Value Ref Range Status   Enterococcus species DETECTED (A) NOT DETECTED Corrected    Comment: CRITICAL RESULT CALLED TO, READ BACK BY AND VERIFIED WITH: PHARMD MARY @0829  01/29/20 AKT CORRECTED ON 02/19 AT 0936: PREVIOUSLY REPORTED AS CRITICAL RESULT CALLED TO, READ BACK BY AND VERIFIED WITH: PHARMD MARY @0829  01/29/20 AKT    Vancomycin resistance NOT DETECTED NOT DETECTED Final   Listeria monocytogenes NOT DETECTED NOT DETECTED Final   Staphylococcus species NOT DETECTED NOT DETECTED Final   Staphylococcus aureus (BCID) NOT DETECTED NOT DETECTED Final   Streptococcus species NOT DETECTED NOT DETECTED Final   Streptococcus agalactiae NOT DETECTED NOT DETECTED  Final   Streptococcus pneumoniae NOT DETECTED NOT DETECTED Final   Streptococcus pyogenes NOT DETECTED NOT DETECTED Final   Acinetobacter baumannii NOT DETECTED NOT DETECTED Final   Enterobacteriaceae species DETECTED (A) NOT DETECTED Final    Comment: Enterobacteriaceae represent a large family of gram-negative bacteria, not a single organism. CRITICAL RESULT CALLED TO, READ BACK BY AND VERIFIED WITH: PHARMD MARY @0829  01/29/20 AKT    Enterobacter cloacae complex NOT DETECTED NOT DETECTED Final   Escherichia coli DETECTED (A) NOT DETECTED Final    Comment: CRITICAL RESULT CALLED TO, READ BACK BY AND VERIFIED WITH: PHARMD MARY @0829  01/29/20 AKT    Klebsiella oxytoca NOT DETECTED NOT DETECTED Final   Klebsiella pneumoniae NOT DETECTED NOT DETECTED Final   Proteus  species NOT DETECTED NOT DETECTED Final   Serratia marcescens NOT DETECTED NOT DETECTED Final   Carbapenem resistance NOT DETECTED NOT DETECTED Final   Haemophilus influenzae NOT DETECTED NOT DETECTED Final   Neisseria meningitidis NOT DETECTED NOT DETECTED Final   Pseudomonas aeruginosa NOT DETECTED NOT DETECTED Final   Candida albicans NOT DETECTED NOT DETECTED Final   Candida glabrata NOT DETECTED NOT DETECTED Final   Candida krusei NOT DETECTED NOT DETECTED Final   Candida parapsilosis NOT DETECTED NOT DETECTED Final   Candida tropicalis NOT DETECTED NOT DETECTED Final    Comment: Performed at [facility], [place] [address], [place], [place] [address]  Blood Culture (routine x 2)     Status: None (Preliminary result)   Collection Time: 01/28/20  5:16 PM   Specimen: BLOOD LEFT HAND  Result Value Ref Range Status   Specimen Description   Final    BLOOD LEFT HAND Performed at [place] [address] [place], [place] [address]    Special Requests   Final    BOTTLES DRAWN AEROBIC AND ANAEROBIC Blood Culture adequate volume Performed at [place] [address],  [place], [place] [address]    Culture  Setup Time   Final    GRAM NEGATIVE RODS IN BOTH AEROBIC AND ANAEROBIC BOTTLES CRITICAL VALUE NOTED.  VALUE IS CONSISTENT WITH PREVIOUSLY REPORTED AND CALLED VALUE. Performed at [facility], [place] [address], [place], [place] [address]    Culture GRAM NEGATIVE RODS  Final   Report Status PENDING  Incomplete  SARS CORONAVIRUS 2 (TAT 6-24 HRS) Nasopharyngeal Nasopharyngeal Swab     Status: None   Collection Time: 01/28/20  8:54 PM   Specimen: Nasopharyngeal Swab  Result Value Ref Range Status   SARS Coronavirus 2 NEGATIVE NEGATIVE Final    Comment: (NOTE) SARS-CoV-2 target nucleic acids are NOT DETECTED. The SARS-CoV-2 RNA is generally detectable in upper and lower respiratory specimens during the acute phase of infection. Negative results do not preclude SARS-CoV-2 infection, do not rule out co-infections with other pathogens, and should not be used as the sole basis for treatment or other patient management decisions. Negative results must be combined with clinical observations, patient history, and epidemiological information. The expected result is Negative. Fact Sheet for Patients: SugarRoll.be Fact Sheet for Healthcare Providers: https://www.woods-mathews.com/ This test is not yet approved or cleared by the [place] FDA and  has been authorized for detection and/or diagnosis of SARS-CoV-2 by FDA under an Emergency Use Authorization (EUA). This EUA will remain  in effect (meaning this test can be used) for the duration of the COVID-19 declaration under Section 56 4(b)(1) of the Act, 21 U.S.C. section 360bbb-3(b)(1), unless the authorization is terminated or revoked sooner. Performed at [facility], [place] [address] [place], [place] [address]   MRSA PCR Screening     Status: None   Collection Time: 01/28/20 11:55 PM   Specimen: Nasal Mucosa; Nasopharyngeal  Result Value Ref Range  Status   MRSA by PCR NEGATIVE NEGATIVE Final    Comment:        The GeneXpert MRSA Assay (FDA approved for NASAL specimens only), is one component of a comprehensive MRSA colonization surveillance program. It is not intended to diagnose MRSA infection nor to guide or monitor treatment for MRSA infections. Performed at [facility], [place] [address] [place], [place] [address]          Radiology Studies: DG  Chest Port 1 View  Result Date: 01/28/2020 CLINICAL DATA:  Fever. EXAM: PORTABLE CHEST 1 VIEW COMPARISON:  January 05, 2020 FINDINGS: Mild cardiomegaly. The hila and mediastinum are unremarkable. No pneumothorax. No nodules or masses. No focal infiltrates. Mild interstitial prominence without overt edema. IMPRESSION: Mild interstitial prominence could be due to the portable technique versus pulmonary venous congestion. No focal infiltrates or other acute abnormalities. Electronically Signed   By: Dorise Bullion III M.D   On: 01/28/2020 17:37        Scheduled Meds: . Chlorhexidine Gluconate Cloth  6 each Topical Daily  . mouth rinse  15 mL Mouth Rinse BID  . sodium chloride flush  3 mL Intravenous Q12H   Continuous Infusions: . sodium chloride 75 mL/hr at 01/28/20 2210  . ceFEPime (MAXIPIME) IV    . [START ON 01/30/2020] vancomycin       LOS: 1 day    Critical Care Time Upon my evaluation, this patient had a high probability of imminent or life-threatening deterioration due to septic shock, which required my direct attention, intervention, and personal management.  I have personally provided 42 minutes of critical care time exclusive of my time spent on separately billable procedures.  Time includes review of laboratory data, radiology results, discussion with consultants, and monitoring for potential decompensation.      Tiombe Tomeo J [place] DO [facility] Available via Epic secure chat 7am-7pm After these hours, please refer to coverage  provider listed on [email or web address] 01/29/2020, 10:21 AM

## 2020-01-29 NOTE — Progress Notes (Signed)
PHARMACY - PHYSICIAN COMMUNICATION CRITICAL VALUE ALERT - BLOOD CULTURE IDENTIFICATION (BCID)  Frank Daugherty is an 74 y.o. male who presented to [facility] on 01/28/2020 with a chief complaint of sepsis, unknown source.  Assessment:  BCID 4/4 bottles E coli, and Enterococcus, no Vanc resistance  Name of physician (or Provider) Contacted: Dr. [place]  Current antibiotics: Cefepime  Changes to prescribed antibiotics recommended: resume Vancomycin, await Enterococcus sensitivities (may be able to narrow tx)   Results for orders placed or performed during the hospital encounter of 01/28/20  Blood Culture ID Panel (Reflexed) (Collected: 01/28/2020  5:08 PM)  Result Value Ref Range   Enterococcus species (A) NOT DETECTED    CRITICAL RESULT CALLED TO, READ BACK BY AND VERIFIED WITH:   Vancomycin resistance NOT DETECTED NOT DETECTED   Listeria monocytogenes NOT DETECTED NOT DETECTED   Staphylococcus species NOT DETECTED NOT DETECTED   Staphylococcus aureus (BCID) NOT DETECTED NOT DETECTED   Streptococcus species NOT DETECTED NOT DETECTED   Streptococcus agalactiae NOT DETECTED NOT DETECTED   Streptococcus pneumoniae NOT DETECTED NOT DETECTED   Streptococcus pyogenes NOT DETECTED NOT DETECTED   Acinetobacter baumannii NOT DETECTED NOT DETECTED   Enterobacteriaceae species DETECTED (A) NOT DETECTED   Enterobacter cloacae complex NOT DETECTED NOT DETECTED   Escherichia coli DETECTED (A) NOT DETECTED   Klebsiella oxytoca NOT DETECTED NOT DETECTED   Klebsiella pneumoniae NOT DETECTED NOT DETECTED   Proteus species NOT DETECTED NOT DETECTED   Serratia marcescens NOT DETECTED NOT DETECTED   Carbapenem resistance NOT DETECTED NOT DETECTED   Haemophilus influenzae NOT DETECTED NOT DETECTED   Neisseria meningitidis NOT DETECTED NOT DETECTED   Pseudomonas aeruginosa NOT DETECTED NOT DETECTED   Candida albicans NOT DETECTED NOT DETECTED   Candida glabrata NOT DETECTED NOT DETECTED   Candida  krusei NOT DETECTED NOT DETECTED   Candida parapsilosis NOT DETECTED NOT DETECTED   Candida tropicalis NOT DETECTED NOT DETECTED    Minda Ditto PharmD 01/29/2020  8:52 AM

## 2020-01-29 NOTE — Progress Notes (Signed)
CRITICAL VALUE ALERT  Critical Value:  Lactic acid 3.5  Date & Time Notied:  01/29/20 @ 02:04  Provider Notified: [place]  Orders Received/Actions taken: Continue with sepsis protocol. Second unit of blood to be started soon.

## 2020-01-29 NOTE — Consult Note (Signed)
[facility]    Date of Admission:  01/28/2020   Total days of antibiotics: 1        vanco/cefepime               Reason for Consult: Enterococcal bacteremia    Referring Provider: CHAMP!   Assessment: Enterococcal bacteremia Enterobacter bacteremia (4/4 GNR on BCx) Metastatic CA Aki  Plan: 1. Change anbx to zosyn 2. await further ID and sensi results 3. Repeat BCx 4. Consider onc f/u 5. Consider eval by palliative care   Comment His BCx is growing 4/4 bottles GNR but his BCID flagged for both enterobacter and enterococcus. I am not sure that the enterococcus is real?  Given his metastatic disease and his liver disease, having him seen by palliative care is reasonable and may help him and his family with goals of care.  Hopefully his AKI will return to baseline with hydration. Abd/pelvis imaging is pending.    Thank you so much for this interesting consult,  Principal Problem:   Severe sepsis with septic shock ([place]) Active Problems:   Coagulopathy ([place])   Acute lower UTI   Elevated lactic acid level   Acute on chronic anemia   Acute metabolic encephalopathy   AKI (acute kidney injury) ([place])   Hypocalcemia   Transaminitis   . Chlorhexidine Gluconate Cloth  6 each Topical Daily  . mouth rinse  15 mL Mouth Rinse BID  . sodium chloride flush  3 mL Intravenous Q12H    HPI: Frank Daugherty is a 74 y.o. male adm from SNF with confusion and fever on 2-18. He was found to have temp 100.4 in ED as well as tachycardia and hypotension.  He was acidotic, had elevated Cr, elevated LFTs, and anemia to 6.7, as well as WBC 11. He was noted to have pyuria, hematuria.  He was started on vanco/cefepime.   His hx is notable for adm 12-2019 with afib and RVR (new) after near syncope. He was found to have abn LFTs and bone marrow lesions on pelvis and spine MRI.   On 2-4 he underwent CT guided bone marrow Bx and was d/c to SNF/[facility].    His bone marrow bx suggested metastatic disease (prostate?). His PSA is 1,101.   Currently hs ie A & O x 3. Able to tell me he grew up in [place] and came to [place] in 1972.  Review of Systems: Review of Systems  Constitutional: Positive for fever. Negative for chills.  Respiratory: Negative for cough and shortness of breath.   Gastrointestinal: Negative for constipation and diarrhea.  Genitourinary: Negative for dysuria.  Psychiatric/Behavioral: The patient does not have insomnia.   Please see HPI. All other systems reviewed and negative.   Past Medical History:  Diagnosis Date  . Chronic anemia   . Hypertension     Social History   Tobacco Use  . Smoking status: Never Smoker  . Smokeless tobacco: Never Used  Substance Use Topics  . Alcohol use: Yes    Alcohol/week: 4.0 standard drinks    Types: 4 Cans of beer per week  . Drug use: Never    Family History  Family history unknown: Yes     Medications:  Scheduled: . Chlorhexidine Gluconate Cloth  6 each Topical Daily  . mouth rinse  15 mL Mouth Rinse BID  . sodium chloride flush  3 mL Intravenous Q12H    Abtx:  Anti-infectives (From  admission, onward)   Start     Dose/Rate Route Frequency Ordered Stop   01/30/20 1800  vancomycin (VANCOREADY) IVPB 1500 mg/300 mL     1,500 mg 150 mL/hr over 120 Minutes Intravenous Every 48 hours 01/29/20 0849     01/29/20 1600  ceFEPIme (MAXIPIME) 2 g in sodium chloride 0.9 % 100 mL IVPB  Status:  Discontinued     2 g 200 mL/hr over 30 Minutes Intravenous Every 24 hours 01/29/20 0217 01/29/20 0916   01/29/20 1000  ceFEPIme (MAXIPIME) 2 g in sodium chloride 0.9 % 100 mL IVPB     2 g 200 mL/hr over 30 Minutes Intravenous Every 12 hours 01/29/20 0916     01/28/20 1700  ceFEPIme (MAXIPIME) 2 g in sodium chloride 0.9 % 100 mL IVPB     2 g 200 mL/hr over 30 Minutes Intravenous  Once 01/28/20 1648 01/28/20 1849   01/28/20 1700  vancomycin (VANCOCIN) IVPB 1000 mg/200 mL premix  Status:   Discontinued     1,000 mg 200 mL/hr over 60 Minutes Intravenous  Once 01/28/20 1648 01/28/20 1651   01/28/20 1700  vancomycin (VANCOREADY) IVPB 1500 mg/300 mL     1,500 mg 150 mL/hr over 120 Minutes Intravenous  Once 01/28/20 1651 01/28/20 2024        OBJECTIVE: Blood pressure 110/63, pulse (!) 108, temperature 99 F (37.2 C), temperature source Oral, resp. rate 17, height 5' 11" (1.803 m), weight 70.4 kg, SpO2 98 %.  Physical Exam Constitutional:      General: He is not in acute distress.    Appearance: Normal appearance. He is not ill-appearing or toxic-appearing.  HENT:     Mouth/Throat:     Mouth: Mucous membranes are moist.     Pharynx: No oropharyngeal exudate.  Eyes:     Extraocular Movements: Extraocular movements intact.     Pupils: Pupils are equal, round, and reactive to light.  Cardiovascular:     Rate and Rhythm: Normal rate and regular rhythm.  Pulmonary:     Effort: Pulmonary effort is normal.     Breath sounds: Normal breath sounds.  Abdominal:     General: Bowel sounds are normal. There is no distension.     Palpations: Abdomen is soft.     Tenderness: There is no abdominal tenderness.  Musculoskeletal:     Cervical back: Normal range of motion and neck supple.     Right lower leg: No edema.     Left lower leg: No edema.  Neurological:     General: No focal deficit present.     Mental Status: He is alert and oriented to person, place, and time.  Psychiatric:        Mood and Affect: Mood normal.        Thought Content: Thought content normal.     Lab Results Results for orders placed or performed during the hospital encounter of 01/28/20 (from the past 48 hour(s))  Blood Culture (routine x 2)     Status: None (Preliminary result)   Collection Time: 01/28/20  5:08 PM   Specimen: BLOOD  Result Value Ref Range   Specimen Description      BLOOD RIGHT ANTECUBITAL Performed at [facility], [place] [address], [place], [place]  [address]    Special Requests      BOTTLES DRAWN AEROBIC AND ANAEROBIC Blood Culture adequate volume Performed at [place] [address] [place], [place] [address]    Culture  Setup Time      GRAM NEGATIVE RODS IN BOTH AEROBIC AND ANAEROBIC BOTTLES CRITICAL RESULT CALLED TO, READ BACK BY AND VERIFIED WITH: [place] _0  01/29/20 AKT Performed at [facility], [place] [address], [place], [place] [address]    Culture GRAM NEGATIVE RODS    Report Status PENDING   Blood Culture ID Panel (Reflexed)     Status: Abnormal   Collection Time: 01/28/20  5:08 PM  Result Value Ref Range   Enterococcus species DETECTED (A) NOT DETECTED    Comment: CRITICAL RESULT CALLED TO, READ BACK BY AND VERIFIED WITH: PHARMD MARY _1  01/29/20 AKT CORRECTED ON 02/19 AT 0936: PREVIOUSLY REPORTED AS CRITICAL RESULT CALLED TO, READ BACK BY AND VERIFIED WITH: PHARMD MARY _2  01/29/20 AKT    Vancomycin resistance NOT DETECTED NOT DETECTED   Listeria monocytogenes NOT DETECTED NOT DETECTED   Staphylococcus species NOT DETECTED NOT DETECTED   Staphylococcus aureus (BCID) NOT DETECTED NOT DETECTED   Streptococcus species NOT DETECTED NOT DETECTED   Streptococcus agalactiae NOT DETECTED NOT DETECTED   Streptococcus pneumoniae NOT DETECTED NOT DETECTED   Streptococcus pyogenes NOT DETECTED NOT DETECTED   Acinetobacter baumannii NOT DETECTED NOT DETECTED   Enterobacteriaceae species DETECTED (A) NOT DETECTED    Comment: Enterobacteriaceae represent a large family of gram-negative bacteria, not a single organism. CRITICAL RESULT CALLED TO, READ BACK BY AND VERIFIED WITH: PHARMD MARY _3  01/29/20 AKT    Enterobacter cloacae complex NOT DETECTED NOT DETECTED   Escherichia coli DETECTED (A) NOT DETECTED    Comment: CRITICAL RESULT CALLED TO, READ BACK BY AND VERIFIED WITH: PHARMD MARY _4  01/29/20 AKT    Klebsiella oxytoca NOT DETECTED NOT DETECTED   Klebsiella pneumoniae NOT DETECTED  NOT DETECTED   Proteus species NOT DETECTED NOT DETECTED   Serratia marcescens NOT DETECTED NOT DETECTED   Carbapenem resistance NOT DETECTED NOT DETECTED   Haemophilus influenzae NOT DETECTED NOT DETECTED   Neisseria meningitidis NOT DETECTED NOT DETECTED   Pseudomonas aeruginosa NOT DETECTED NOT DETECTED   Candida albicans NOT DETECTED NOT DETECTED   Candida glabrata NOT DETECTED NOT DETECTED   Candida krusei NOT DETECTED NOT DETECTED   Candida parapsilosis NOT DETECTED NOT DETECTED   Candida tropicalis NOT DETECTED NOT DETECTED    Comment: Performed at [facility], [address] [place], [place] [address]  Lactic acid, plasma     Status: Abnormal   Collection Time: 01/28/20  5:16 PM  Result Value Ref Range   Lactic Acid, Venous 5.8 (HH) 0.5 - 1.9 mmol/L    Comment: CRITICAL VALUE NOTED.  VALUE IS CONSISTENT WITH PREVIOUSLY REPORTED AND CALLED VALUE. Performed at [facility], [place] [address], [place], [place] [address]   Comprehensive metabolic panel     Status: Abnormal   Collection Time: 01/28/20  5:16 PM  Result Value Ref Range   Sodium 138 135 - 145 mmol/L   Potassium 4.4 3.5 - 5.1 mmol/L   Chloride 108 98 - 111 mmol/L   CO2 15 (L) 22 - 32 mmol/L   Glucose, Bld 225 (H) 70 - 99 mg/dL   BUN 37 (H) 8 - 23 mg/dL   Creatinine, Ser 2.42 (H) 0.61 - 1.24 mg/dL   Calcium 5.5 (LL) 8.9 - 10.3 mg/dL    Comment: CRITICAL RESULT CALLED TO, READ BACK BY AND VERIFIED WITH: RN J OXENDINE AT 1942 01/28/20 CRUICKSHANK A    Total Protein 5.9 (L) 6.5 - 8.1 g/dL  Albumin 1.8 (L) 3.5 - 5.0 g/dL   AST 235 (H) 15 - 41 U/L   ALT 132 (H) 0 - 44 U/L   Alkaline Phosphatase 870 (H) 38 - 126 U/L   Total Bilirubin 1.4 (H) 0.3 - 1.2 mg/dL   GFR calc non Af Amer 25 (L) >60 mL/min   GFR calc Af Amer 29 (L) >60 mL/min   Anion gap 15 5 - 15    Comment: Performed at [facility], [place] [address], [place], [place] [address]  CBC WITH DIFFERENTIAL     Status:  Abnormal   Collection Time: 01/28/20  5:16 PM  Result Value Ref Range   WBC 11.0 (H) 4.0 - 10.5 K/uL    Comment: WHITE COUNT CONFIRMED ON SMEAR   RBC 2.49 (L) 4.22 - 5.81 MIL/uL   Hemoglobin 6.7 (LL) 13.0 - 17.0 g/dL    Comment: REPEATED TO VERIFY THIS CRITICAL RESULT HAS VERIFIED AND BEEN CALLED TO C.HALL BY NATHAN THOMPSON ON 02 18 2021 AT 1856, AND HAS BEEN READ BACK. CRITICAL RESULT VERIFIED    HCT 21.0 (L) 39.0 - 52.0 %   MCV 84.3 80.0 - 100.0 fL   MCH 26.9 26.0 - 34.0 pg   MCHC 31.9 30.0 - 36.0 g/dL   RDW 21.4 (H) 11.5 - 15.5 %   Platelets 135 (L) 150 - 400 K/uL   nRBC 4.5 (H) 0.0 - 0.2 %   Neutrophils Relative % 60 %   Lymphocytes Relative 16 %   Monocytes Relative 1 %   Eosinophils Relative 0 %   Basophils Relative 0 %   Band Neutrophils 17 %   Metamyelocytes Relative 6 %   Myelocytes 0 %   Promyelocytes Relative 0 %   Blasts 0 %   nRBC 0 0 /100 WBC   Other 0 %   Neutro Abs 8.4 (H) 1.7 - 7.7 K/uL   Lymphs Abs 1.8 0.7 - 4.0 K/uL   Monocytes Absolute 0.1 0.1 - 1.0 K/uL   Eosinophils Absolute 0.0 0.0 - 0.5 K/uL   Basophils Absolute 0.0 0.0 - 0.1 K/uL   Abs Immature Granulocytes 0.66 (H) 0.00 - 0.07 K/uL   RBC Morphology RARE NRBCs    WBC Morphology TOXIC GRANULATION     Comment: VACUOLATED NEUTROPHILS MILD LEFT SHIFT (1-5% METAS, OCC MYELO, OCC BANDS)    Target Cells PRESENT    Polychromasia PRESENT    Tear Drop Cells PRESENT    Dohle Bodies PRESENT     Comment: Performed at [facility], [place] [address], [place], [place] [address]  APTT     Status: Abnormal   Collection Time: 01/28/20  5:16 PM  Result Value Ref Range   aPTT 46 (H) 24 - 36 seconds    Comment:        IF BASELINE aPTT IS ELEVATED, SUGGEST PATIENT RISK ASSESSMENT BE USED TO DETERMINE APPROPRIATE ANTICOAGULANT THERAPY. Performed at [facility], [place] [address], [place], [place] [address]   Protime-INR     Status: Abnormal   Collection Time: 01/28/20  5:16  PM  Result Value Ref Range   Prothrombin Time 23.9 (H) 11.4 - 15.2 seconds   INR 2.1 (H) 0.8 - 1.2    Comment: (NOTE) INR goal varies based on device and disease states. Performed at [facility], [place] [address] [place], [place] [address]   Blood Culture (routine x 2)     Status: None (Preliminary result)  Collection Time: 01/28/20  5:16 PM   Specimen: BLOOD LEFT HAND  Result Value Ref Range   Specimen Description      BLOOD LEFT HAND Performed at [facility] [address], [place], [place] [address]    Special Requests      BOTTLES DRAWN AEROBIC AND ANAEROBIC Blood Culture adequate volume Performed at [facility] [address], [place], [place] [address]    Culture  Setup Time      GRAM NEGATIVE RODS IN BOTH AEROBIC AND ANAEROBIC BOTTLES CRITICAL VALUE NOTED.  VALUE IS CONSISTENT WITH PREVIOUSLY REPORTED AND CALLED VALUE. Performed at [facility], [place] [address], [place], [place] [address]    Culture GRAM NEGATIVE RODS    Report Status PENDING   Reticulocytes     Status: Abnormal   Collection Time: 01/28/20  5:16 PM  Result Value Ref Range   Retic Ct Pct 2.1 0.4 - 3.1 %   RBC. 2.49 (L) 4.22 - 5.81 MIL/uL   Retic Count, Absolute 53.3 19.0 - 186.0 K/uL   Immature Retic Fract 40.8 (H) 2.3 - 15.9 %    Comment: Performed at [facility], [place] [address], [place], [place] [address]  Magnesium     Status: None   Collection Time: 01/28/20  5:38 PM  Result Value Ref Range   Magnesium 1.9 1.7 - 2.4 mg/dL    Comment: Performed at [facility], [place] [address], [place], [place] [address]  Urinalysis, Routine w reflex microscopic     Status: Abnormal   Collection Time: 01/28/20  5:54 PM  Result Value Ref Range   Color, Urine AMBER (A) YELLOW    Comment: BIOCHEMICALS MAY BE AFFECTED BY COLOR   APPearance TURBID (A) CLEAR   Specific Gravity, Urine 1.020 1.005 - 1.030   pH 5.0  5.0 - 8.0   Glucose, UA NEGATIVE NEGATIVE mg/dL   Hgb urine dipstick LARGE (A) NEGATIVE   Bilirubin Urine NEGATIVE NEGATIVE   Ketones, ur 5 (A) NEGATIVE mg/dL   Protein, ur 100 (A) NEGATIVE mg/dL   Nitrite NEGATIVE NEGATIVE   Leukocytes,Ua LARGE (A) NEGATIVE   RBC / HPF >50 (H) 0 - 5 RBC/hpf   WBC, UA >50 (H) 0 - 5 WBC/hpf   Bacteria, UA MANY (A) NONE SEEN   WBC Clumps PRESENT    Hyaline Casts, UA PRESENT     Comment: Performed at [facility], [place] [address], [place], [place] [address]  Rapid urine drug screen (hospital performed)     Status: None   Collection Time: 01/28/20  6:00 PM  Result Value Ref Range   Opiates NONE DETECTED NONE DETECTED   Cocaine NONE DETECTED NONE DETECTED   Benzodiazepines NONE DETECTED NONE DETECTED   Amphetamines NONE DETECTED NONE DETECTED   Tetrahydrocannabinol NONE DETECTED NONE DETECTED   Barbiturates NONE DETECTED NONE DETECTED    Comment: (NOTE) DRUG SCREEN FOR MEDICAL PURPOSES ONLY.  IF CONFIRMATION IS NEEDED FOR ANY PURPOSE, NOTIFY LAB WITHIN 5 DAYS. LOWEST DETECTABLE LIMITS FOR URINE DRUG SCREEN Drug Class                     Cutoff (ng/mL) Amphetamine and metabolites    1000 Barbiturate and metabolites    200 Benzodiazepine                 956 Tricyclics and metabolites     300 Opiates and metabolites  300 Cocaine and metabolites        300 THC                            50 Performed at [facility], [place] [address] [place], [place] [address]   Sodium, urine, random     Status: None   Collection Time: 01/28/20  6:00 PM  Result Value Ref Range   Sodium, Ur 25 mmol/L    Comment: Performed at [facility], [place] [address] [place], [place] [address]  Creatinine, urine, random     Status: None   Collection Time: 01/28/20  6:00 PM  Result Value Ref Range   Creatinine, Urine 375.94 mg/dL    Comment: Performed at [facility], [place] [address],  [place], [place] [address]  POC SARS Coronavirus 2 Ag-ED - Nasal Swab (BD Veritor Kit)     Status: None   Collection Time: 01/28/20  6:18 PM  Result Value Ref Range   SARS Coronavirus 2 Ag NEGATIVE NEGATIVE    Comment: (NOTE) SARS-CoV-2 antigen NOT DETECTED.  Negative results are presumptive.  Negative results do not preclude SARS-CoV-2 infection and should not be used as the sole basis for treatment or other patient management decisions, including infection  control decisions, particularly in the presence of clinical signs and  symptoms consistent with COVID-19, or in those who have been in contact with the virus.  Negative results must be combined with clinical observations, patient history, and epidemiological information. The expected result is Negative. Fact Sheet for Patients: PodPark.tn Fact Sheet for Healthcare Providers: GiftContent.is This test is not yet approved or cleared by the [place] FDA and  has been authorized for detection and/or diagnosis of SARS-CoV-2 by FDA under an Emergency Use Authorization (EUA).  This EUA will remain in effect (meaning this test can be used) for the duration of  the COVID-19 de claration under Section 564(b)(1) of the Act, 21 U.S.C. section 360bbb-3(b)(1), unless the authorization is terminated or revoked sooner.   ABO/Rh     Status: None   Collection Time: 01/28/20  7:35 PM  Result Value Ref Range   ABO/RH(D)      B NEG Performed at [facility], [place] [address] [place], [place] [address]   Lactic acid, plasma     Status: Abnormal   Collection Time: 01/28/20  7:41 PM  Result Value Ref Range   Lactic Acid, Venous 5.8 (HH) 0.5 - 1.9 mmol/L    Comment: CRITICAL RESULT CALLED TO, READ BACK BY AND VERIFIED WITH: RN Jordan Hawks AT 3976 01/28/20 CRUICKSHANK A Performed at [facility], [place] [address], [place], [place] [address]   Type and screen  [place]     Status: None (Preliminary result)   Collection Time: 01/28/20  7:42 PM  Result Value Ref Range   ABO/RH(D) B NEG    Antibody Screen NEG    Sample Expiration 01/31/2020,2359    Unit Number [id number]    Blood Component Type RED CELLS,LR    Unit division 00    Status of Unit ISSUED,FINAL    Transfusion Status OK TO TRANSFUSE    Crossmatch Result Compatible    Unit Number [id number]    Blood Component Type RED CELLS,LR    Unit division 00    Status of Unit ISSUED    Transfusion Status OK TO TRANSFUSE    Crossmatch Result  Compatible Performed at [facility], [place] [address], [place], [place] [address]   Prepare RBC     Status: None   Collection Time: 01/28/20  8:00 PM  Result Value Ref Range   Order Confirmation      ORDER PROCESSED BY BLOOD BANK Performed at [facility], [place] [address], [place], [place] [address]   SARS CORONAVIRUS 2 (TAT 6-24 HRS) Nasopharyngeal Nasopharyngeal Swab     Status: None   Collection Time: 01/28/20  8:54 PM   Specimen: Nasopharyngeal Swab  Result Value Ref Range   SARS Coronavirus 2 NEGATIVE NEGATIVE    Comment: (NOTE) SARS-CoV-2 target nucleic acids are NOT DETECTED. The SARS-CoV-2 RNA is generally detectable in upper and lower respiratory specimens during the acute phase of infection. Negative results do not preclude SARS-CoV-2 infection, do not rule out co-infections with other pathogens, and should not be used as the sole basis for treatment or other patient management decisions. Negative results must be combined with clinical observations, patient history, and epidemiological information. The expected result is Negative. Fact Sheet for Patients: SugarRoll.be Fact Sheet for Healthcare Providers: https://www.woods-mathews.com/ This test is not yet approved or cleared by the [place] FDA and  has been authorized  for detection and/or diagnosis of SARS-CoV-2 by FDA under an Emergency Use Authorization (EUA). This EUA will remain  in effect (meaning this test can be used) for the duration of the COVID-19 declaration under Section 56 4(b)(1) of the Act, 21 U.S.C. section 360bbb-3(b)(1), unless the authorization is terminated or revoked sooner. Performed at [facility], [place] [address] [place], [place] [address]   Ferritin     Status: Abnormal   Collection Time: 01/28/20 10:00 PM  Result Value Ref Range   Ferritin >7,500 (H) 24 - 336 ng/mL    Comment: RESULTS CONFIRMED BY MANUAL DILUTION Performed at [facility], [place] [address] [place], [place] [address]   Iron and TIBC     Status: Abnormal   Collection Time: 01/28/20 10:00 PM  Result Value Ref Range   Iron 22 (L) 45 - 182 ug/dL   TIBC 166 (L) 250 - 450 ug/dL   Saturation Ratios 13 (L) 17.9 - 39.5 %   UIBC 144 ug/dL    Comment: Performed at [facility], [place] [address] [place], [place] [address]  Acetaminophen level     Status: Abnormal   Collection Time: 01/28/20 10:03 PM  Result Value Ref Range   Acetaminophen (Tylenol), Serum <10 (L) 10 - 30 ug/mL    Comment: (NOTE) Therapeutic concentrations vary significantly. A range of 10-30 ug/mL  may be an effective concentration for many patients. However, some  are best treated at concentrations outside of this range. Acetaminophen concentrations >150 ug/mL at 4 hours after ingestion  and >50 ug/mL at 12 hours after ingestion are often associated with  toxic reactions. Performed at [facility], [place] [address], [place], [place] [address]   MRSA PCR Screening     Status: None   Collection Time: 01/28/20 11:55 PM   Specimen: Nasal Mucosa; Nasopharyngeal  Result Value Ref Range   MRSA by PCR NEGATIVE NEGATIVE    Comment:        The GeneXpert MRSA Assay (FDA approved for NASAL specimens only), is one component of  a comprehensive MRSA colonization surveillance program. It is not intended to diagnose MRSA infection nor to guide or monitor treatment for MRSA infections. Performed at [facility], [place] [address], [place], [place] [address]   Protime-INR     Status: Abnormal   Collection Time: 01/29/20  1:00 AM  Result Value Ref Range   Prothrombin Time 24.6 (H) 11.4 - 15.2 seconds   INR 2.2 (H) 0.8 - 1.2    Comment: (NOTE) INR goal varies based on device and disease states. Performed at [facility], [place] [address] [place], [place] [address]   TSH     Status: None   Collection Time: 01/29/20  1:00 AM  Result Value Ref Range   TSH 1.978 0.350 - 4.500 uIU/mL    Comment: Performed by a 3rd Generation assay with a functional sensitivity of <=0.01 uIU/mL. Performed at [facility], [place] [address], [place], [place] [address]   Lactic acid, plasma     Status: Abnormal   Collection Time: 01/29/20  1:00 AM  Result Value Ref Range   Lactic Acid, Venous 3.5 (HH) 0.5 - 1.9 mmol/L    Comment: CRITICAL VALUE NOTED.  VALUE IS CONSISTENT WITH PREVIOUSLY REPORTED AND CALLED VALUE. Performed at [facility], [place] [address] [place], [place] [address]   Ammonia     Status: None   Collection Time: 01/29/20  1:00 AM  Result Value Ref Range   Ammonia 15 9 - 35 umol/L    Comment: Performed at [facility], [place] [address] [place], [place] [address]  Hepatitis panel, acute     Status: None   Collection Time: 01/29/20  7:18 AM  Result Value Ref Range   Hepatitis B Surface Ag NON REACTIVE NON REACTIVE   HCV Ab NON REACTIVE NON REACTIVE    Comment: (NOTE) Nonreactive HCV antibody screen is consistent with no HCV infections,  unless recent infection is suspected or other evidence exists to indicate HCV infection.    Hep A IgM NON REACTIVE NON REACTIVE   Hep B C IgM NON REACTIVE NON REACTIVE    Comment: Performed  at [facility], [place] [address] [place], [place] [address]  CBC with Differential/Platelet     Status: Abnormal   Collection Time: 01/29/20  7:18 AM  Result Value Ref Range   WBC 7.8 4.0 - 10.5 K/uL    Comment: REPEATED TO VERIFY WHITE COUNT CONFIRMED ON SMEAR    RBC 3.82 (L) 4.22 - 5.81 MIL/uL   Hemoglobin 10.4 (L) 13.0 - 17.0 g/dL   HCT 32.4 (L) 39.0 - 52.0 %   MCV 84.8 80.0 - 100.0 fL   MCH 27.2 26.0 - 34.0 pg   MCHC 32.1 30.0 - 36.0 g/dL   RDW 18.6 (H) 11.5 - 15.5 %   Platelets 122 (L) 150 - 400 K/uL   nRBC 5.4 (H) 0.0 - 0.2 %   Neutrophils Relative % 65 %   Neutro Abs 6.0 1.7 - 7.7 K/uL   Band Neutrophils 12 %   Lymphocytes Relative 12 %   Lymphs Abs 0.9 0.7 - 4.0 K/uL   Monocytes Relative 2 %   Monocytes Absolute 0.2 0.1 - 1.0 K/uL   Eosinophils Relative 0 %   Eosinophils Absolute 0.0 0.0 - 0.5 K/uL   Basophils Relative 0 %   Basophils Absolute 0.0 0.0 - 0.1 K/uL   WBC Morphology DOHLE BODIES     Comment: MODERATE LEFT SHIFT (>5% METAS AND MYELOS,OCC PRO NOTED) TOXIC GRANULATION VACUOLATED NEUTROPHILS NRBC'S PRESENT    Metamyelocytes Relative 4 %   Myelocytes 5 %  Abs Immature Granulocytes 0.70 (H) 0.00 - 0.07 K/uL   Polychromasia PRESENT    Target Cells PRESENT     Comment: Performed at [facility], [place] [address] [place], [place] [address]  Comprehensive metabolic panel     Status: Abnormal   Collection Time: 01/29/20  7:18 AM  Result Value Ref Range   Sodium 140 135 - 145 mmol/L   Potassium 4.3 3.5 - 5.1 mmol/L   Chloride 110 98 - 111 mmol/L   CO2 14 (L) 22 - 32 mmol/L   Glucose, Bld 101 (H) 70 - 99 mg/dL   BUN 41 (H) 8 - 23 mg/dL   Creatinine, Ser 2.14 (H) 0.61 - 1.24 mg/dL   Calcium 6.0 (LL) 8.9 - 10.3 mg/dL    Comment: CRITICAL RESULT CALLED TO, READ BACK BY AND VERIFIED WITH: PENNINGTON,M. RN AT 9371 01/29/20 MULLINS,T    Total Protein 6.5 6.5 - 8.1 g/dL   Albumin 2.1 (L) 3.5 - 5.0 g/dL   AST 217 (H) 15 - 41 U/L   ALT  139 (H) 0 - 44 U/L   Alkaline Phosphatase 979 (H) 38 - 126 U/L   Total Bilirubin 3.2 (H) 0.3 - 1.2 mg/dL   GFR calc non Af Amer 29 (L) >60 mL/min   GFR calc Af Amer 34 (L) >60 mL/min   Anion gap 16 (H) 5 - 15    Comment: Performed at [facility], [place] [address] [place], [place] [address]  Bilirubin, direct     Status: Abnormal   Collection Time: 01/29/20  7:18 AM  Result Value Ref Range   Bilirubin, Direct 2.0 (H) 0.0 - 0.2 mg/dL    Comment: Performed at [facility], [place] [address] [place], [place] [address]  Magnesium     Status: None   Collection Time: 01/29/20  7:18 AM  Result Value Ref Range   Magnesium 1.9 1.7 - 2.4 mg/dL    Comment: Performed at [facility], [place] [address], [place], [place] [address]      Component Value Date/Time   SDES  01/28/2020 1716    BLOOD LEFT HAND Performed at [facility], [place] [address], [place], [place] [address]    [place]  01/28/2020 1716    BOTTLES DRAWN AEROBIC AND ANAEROBIC Blood Culture adequate volume Performed at [place] [address], [place], [place] [address]    [place] Dose NEGATIVE RODS 01/28/2020 1716   REPTSTATUS PENDING 01/28/2020 1716   DG Chest Port 1 View  Result Date: 01/28/2020 CLINICAL DATA:  Fever. EXAM: PORTABLE CHEST 1 VIEW COMPARISON:  January 05, 2020 FINDINGS: Mild cardiomegaly. The hila and mediastinum are unremarkable. No pneumothorax. No nodules or masses. No focal infiltrates. Mild interstitial prominence without overt edema. IMPRESSION: Mild interstitial prominence could be due to the portable technique versus pulmonary venous congestion. No focal infiltrates or other acute abnormalities. Electronically Signed   By: Dorise Bullion III M.D   On: 01/28/2020 17:37   Recent Results (from the past 240 hour(s))  Blood Culture (routine x 2)     Status: None (Preliminary result)   Collection Time: 01/28/20  5:08  PM   Specimen: BLOOD  Result Value Ref Range Status   Specimen Description   Final    BLOOD RIGHT ANTECUBITAL Performed at [place] [address], [place], [place] [address]    Special Requests   Final    BOTTLES DRAWN AEROBIC AND  ANAEROBIC Blood Culture adequate volume Performed at [facility] [address], [place], [place] [address]    Culture  Setup Time   Final    GRAM NEGATIVE RODS IN BOTH AEROBIC AND ANAEROBIC BOTTLES CRITICAL RESULT CALLED TO, READ BACK BY AND VERIFIED WITH: PHARMD MARY _0  01/29/20 AKT Performed at [facility], [place] [address], [place], [place] [address]    Culture GRAM NEGATIVE RODS  Final   Report Status PENDING  Incomplete  Blood Culture ID Panel (Reflexed)     Status: Abnormal   Collection Time: 01/28/20  5:08 PM  Result Value Ref Range Status   Enterococcus species DETECTED (A) NOT DETECTED Corrected    Comment: CRITICAL RESULT CALLED TO, READ BACK BY AND VERIFIED WITH: PHARMD MARY _1  01/29/20 AKT CORRECTED ON 02/19 AT 0936: PREVIOUSLY REPORTED AS CRITICAL RESULT CALLED TO, READ BACK BY AND VERIFIED WITH: PHARMD MARY _2  01/29/20 AKT    Vancomycin resistance NOT DETECTED NOT DETECTED Final   Listeria monocytogenes NOT DETECTED NOT DETECTED Final   Staphylococcus species NOT DETECTED NOT DETECTED Final   Staphylococcus aureus (BCID) NOT DETECTED NOT DETECTED Final   Streptococcus species NOT DETECTED NOT DETECTED Final   Streptococcus agalactiae NOT DETECTED NOT DETECTED Final   Streptococcus pneumoniae NOT DETECTED NOT DETECTED Final   Streptococcus pyogenes NOT DETECTED NOT DETECTED Final   Acinetobacter baumannii NOT DETECTED NOT DETECTED Final   Enterobacteriaceae species DETECTED (A) NOT DETECTED Final    Comment: Enterobacteriaceae represent a large family of gram-negative bacteria, not a single organism. CRITICAL RESULT CALLED TO, READ BACK BY AND VERIFIED WITH: PHARMD MARY _3   01/29/20 AKT    Enterobacter cloacae complex NOT DETECTED NOT DETECTED Final   Escherichia coli DETECTED (A) NOT DETECTED Final    Comment: CRITICAL RESULT CALLED TO, READ BACK BY AND VERIFIED WITH: PHARMD MARY _4  01/29/20 AKT    Klebsiella oxytoca NOT DETECTED NOT DETECTED Final   Klebsiella pneumoniae NOT DETECTED NOT DETECTED Final   Proteus species NOT DETECTED NOT DETECTED Final   Serratia marcescens NOT DETECTED NOT DETECTED Final   Carbapenem resistance NOT DETECTED NOT DETECTED Final   Haemophilus influenzae NOT DETECTED NOT DETECTED Final   Neisseria meningitidis NOT DETECTED NOT DETECTED Final   Pseudomonas aeruginosa NOT DETECTED NOT DETECTED Final   Candida albicans NOT DETECTED NOT DETECTED Final   Candida glabrata NOT DETECTED NOT DETECTED Final   Candida krusei NOT DETECTED NOT DETECTED Final   Candida parapsilosis NOT DETECTED NOT DETECTED Final   Candida tropicalis NOT DETECTED NOT DETECTED Final    Comment: Performed at [facility], [place] [address], [place], [place] [address]  Blood Culture (routine x 2)     Status: None (Preliminary result)   Collection Time: 01/28/20  5:16 PM   Specimen: BLOOD LEFT HAND  Result Value Ref Range Status   Specimen Description   Final    BLOOD LEFT HAND Performed at [place] [address] [place], [place] [address]    Special Requests   Final    BOTTLES DRAWN AEROBIC AND ANAEROBIC Blood Culture adequate volume Performed at [place] [address], [place],  [address]    Culture  Setup Time   Final    GRAM NEGATIVE RODS IN BOTH AEROBIC AND ANAEROBIC BOTTLES CRITICAL VALUE NOTED.  VALUE IS CONSISTENT WITH PREVIOUSLY REPORTED AND CALLED VALUE. Performed at [facility], [place] [address], [place],  [address]  Culture GRAM NEGATIVE RODS  Final   Report Status PENDING  Incomplete  SARS CORONAVIRUS 2 (TAT 6-24 HRS) Nasopharyngeal Nasopharyngeal Swab      Status: None   Collection Time: 01/28/20  8:54 PM   Specimen: Nasopharyngeal Swab  Result Value Ref Range Status   SARS Coronavirus 2 NEGATIVE NEGATIVE Final    Comment: (NOTE) SARS-CoV-2 target nucleic acids are NOT DETECTED. The SARS-CoV-2 RNA is generally detectable in upper and lower respiratory specimens during the acute phase of infection. Negative results do not preclude SARS-CoV-2 infection, do not rule out co-infections with other pathogens, and should not be used as the sole basis for treatment or other patient management decisions. Negative results must be combined with clinical observations, patient history, and epidemiological information. The expected result is Negative. Fact Sheet for Patients: SugarRoll.be Fact Sheet for Healthcare Providers: https://www.woods-mathews.com/ This test is not yet approved or cleared by the [place] FDA and  has been authorized for detection and/or diagnosis of SARS-CoV-2 by FDA under an Emergency Use Authorization (EUA). This EUA will remain  in effect (meaning this test can be used) for the duration of the COVID-19 declaration under Section 56 4(b)(1) of the Act, 21 U.S.C. section 360bbb-3(b)(1), unless the authorization is terminated or revoked sooner. Performed at [facility], [place] [address] [place], [place] [address]   MRSA PCR Screening     Status: None   Collection Time: 01/28/20 11:55 PM   Specimen: Nasal Mucosa; Nasopharyngeal  Result Value Ref Range Status   MRSA by PCR NEGATIVE NEGATIVE Final    Comment:        The GeneXpert MRSA Assay (FDA approved for NASAL specimens only), is one component of a comprehensive MRSA colonization surveillance program. It is not intended to diagnose MRSA infection nor to guide or monitor treatment for MRSA infections. Performed at [facility], [place] [address] [place], [place] [address]      Microbiology: Recent Results (from the past 240 hour(s))  Blood Culture (routine x 2)     Status: None (Preliminary result)   Collection Time: 01/28/20  5:08 PM   Specimen: BLOOD  Result Value Ref Range Status   Specimen Description   Final    BLOOD RIGHT ANTECUBITAL Performed at [place] [address], [place], [place] [address]    Special Requests   Final    BOTTLES DRAWN AEROBIC AND ANAEROBIC Blood Culture adequate volume Performed at [place] [address], [place], [place] [address]    Culture  Setup Time   Final    GRAM NEGATIVE RODS IN BOTH AEROBIC AND ANAEROBIC BOTTLES CRITICAL RESULT CALLED TO, READ BACK BY AND VERIFIED WITH: PHARMD MARY _0  01/29/20 AKT Performed at [facility], [place] [address] [place], [place] [address]    Culture GRAM NEGATIVE RODS  Final   Report Status PENDING  Incomplete  Blood Culture ID Panel (Reflexed)     Status: Abnormal   Collection Time: 01/28/20  5:08 PM  Result Value Ref Range Status   Enterococcus species DETECTED (A) NOT DETECTED Corrected    Comment: CRITICAL RESULT CALLED TO, READ BACK BY AND VERIFIED WITH: PHARMD MARY _1  01/29/20 AKT CORRECTED ON 02/19 AT 0936: PREVIOUSLY REPORTED AS CRITICAL RESULT CALLED TO, READ BACK BY AND VERIFIED WITH: PHARMD MARY _2  01/29/20 AKT    Vancomycin resistance NOT DETECTED NOT DETECTED Final   Listeria monocytogenes NOT DETECTED NOT DETECTED Final   Staphylococcus species  NOT DETECTED NOT DETECTED Final   Staphylococcus aureus (BCID) NOT DETECTED NOT DETECTED Final   Streptococcus species NOT DETECTED NOT DETECTED Final   Streptococcus agalactiae NOT DETECTED NOT DETECTED Final   Streptococcus pneumoniae NOT DETECTED NOT DETECTED Final   Streptococcus pyogenes NOT DETECTED NOT DETECTED Final   Acinetobacter baumannii NOT DETECTED NOT DETECTED Final   Enterobacteriaceae species DETECTED (A) NOT DETECTED Final    Comment:  Enterobacteriaceae represent a large family of gram-negative bacteria, not a single organism. CRITICAL RESULT CALLED TO, READ BACK BY AND VERIFIED WITH: PHARMD MARY _0  01/29/20 AKT    Enterobacter cloacae complex NOT DETECTED NOT DETECTED Final   Escherichia coli DETECTED (A) NOT DETECTED Final    Comment: CRITICAL RESULT CALLED TO, READ BACK BY AND VERIFIED WITH: PHARMD MARY _1  01/29/20 AKT    Klebsiella oxytoca NOT DETECTED NOT DETECTED Final   Klebsiella pneumoniae NOT DETECTED NOT DETECTED Final   Proteus species NOT DETECTED NOT DETECTED Final   Serratia marcescens NOT DETECTED NOT DETECTED Final   Carbapenem resistance NOT DETECTED NOT DETECTED Final   Haemophilus influenzae NOT DETECTED NOT DETECTED Final   Neisseria meningitidis NOT DETECTED NOT DETECTED Final   Pseudomonas aeruginosa NOT DETECTED NOT DETECTED Final   Candida albicans NOT DETECTED NOT DETECTED Final   Candida glabrata NOT DETECTED NOT DETECTED Final   Candida krusei NOT DETECTED NOT DETECTED Final   Candida parapsilosis NOT DETECTED NOT DETECTED Final   Candida tropicalis NOT DETECTED NOT DETECTED Final    Comment: Performed at [facility], [place] [address] [place], [place] [address]  Blood Culture (routine x 2)     Status: None (Preliminary result)   Collection Time: 01/28/20  5:16 PM   Specimen: BLOOD LEFT HAND  Result Value Ref Range Status   Specimen Description   Final    BLOOD LEFT HAND Performed at [facility] [address] [place], [place] [address]    Special Requests   Final    BOTTLES DRAWN AEROBIC AND ANAEROBIC Blood Culture adequate volume Performed at [facility] [address], [place], [place] [address]    Culture  Setup Time   Final    GRAM NEGATIVE RODS IN BOTH AEROBIC AND ANAEROBIC BOTTLES CRITICAL VALUE NOTED.  VALUE IS CONSISTENT WITH PREVIOUSLY REPORTED AND CALLED VALUE. Performed at [facility], [place] [address]  [place], [place] [address]    Culture GRAM NEGATIVE RODS  Final   Report Status PENDING  Incomplete  SARS CORONAVIRUS 2 (TAT 6-24 HRS) Nasopharyngeal Nasopharyngeal Swab     Status: None   Collection Time: 01/28/20  8:54 PM   Specimen: Nasopharyngeal Swab  Result Value Ref Range Status   SARS Coronavirus 2 NEGATIVE NEGATIVE Final    Comment: (NOTE) SARS-CoV-2 target nucleic acids are NOT DETECTED. The SARS-CoV-2 RNA is generally detectable in upper and lower respiratory specimens during the acute phase of infection. Negative results do not preclude SARS-CoV-2 infection, do not rule out co-infections with other pathogens, and should not be used as the sole basis for treatment or other patient management decisions. Negative results must be combined with clinical observations, patient history, and epidemiological information. The expected result is Negative. Fact Sheet for Patients: SugarRoll.be Fact Sheet for Healthcare Providers: https://www.woods-mathews.com/ This test is not yet approved or cleared by the [place] FDA and  has been authorized for detection and/or diagnosis of SARS-CoV-2 by FDA under an Emergency Use Authorization (EUA). This EUA  will remain  in effect (meaning this test can be used) for the duration of the COVID-19 declaration under Section 56 4(b)(1) of the Act, 21 U.S.C. section 360bbb-3(b)(1), unless the authorization is terminated or revoked sooner. Performed at [facility], [place] [address], [place], [place] [address]   MRSA PCR Screening     Status: None   Collection Time: 01/28/20 11:55 PM   Specimen: Nasal Mucosa; Nasopharyngeal  Result Value Ref Range Status   MRSA by PCR NEGATIVE NEGATIVE Final    Comment:        The GeneXpert MRSA Assay (FDA approved for NASAL specimens only), is one component of a comprehensive MRSA colonization surveillance program. It is not intended to diagnose MRSA infection nor  to guide or monitor treatment for MRSA infections. Performed at [facility], [place] [address], [place], [place] [address]     Radiographs and labs were personally reviewed by me.   Bobby Rumpf, MD [facility] [place] [facility] [phone] 01/29/2020, 10:59 AM

## 2020-01-29 NOTE — Progress Notes (Signed)
Pt received into 1420 transfer from ICU. Admission Vital Signs Temperature orally 100.0 Pulse 111 BP 113/97 Respirations of 18. Yellow MEWS protocol initiated. Pt assessed and there are no other acute changes noted at this time. Pt oriented to room and use of call bell system. MD paged to update on yellow MEWs

## 2020-01-29 NOTE — Progress Notes (Addendum)
HEMATOLOGY-ONCOLOGY PROGRESS NOTE  SUBJECTIVE: The patient was sent to the hospital from the skilled nursing facility secondary to fever.  Family had reported the patient seemed confused compared to baseline for about 2 days prior to admission and was more somnolent.  When seen today, the patient reports that he is feeling fine.  He denies respiratory issues.  Had a fever up to 101.5 last evening.  Blood culture showed gram-negative rods in both the aerobic and anaerobic bottles x2 sets.  Urinalysis suggestive of UTI.  Urine culture pending.  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 Slightly distended gallbladder with sludge, but negative for wall thickness, sonographic Murphy, or other features to suggest acute gallbladder disease. Common duct diameter upper limits of normal 2. Slightly echogenic liver with multiple cysts     01/06/2020 Imaging   MRI Abdomen 01/06/20 IMPRESSION: 1. Marked heterogeneity of the visualized marrow spaces of the spine and pelvis. Areas of restricted diffusion and abnormal enhancement on a background of iron deposition. Correlate with any history of chronic anemia and exogenous iron administration or transfusions. Findings raise the question of process such as multiple myeloma, perhaps this would explain the elevated alkaline phosphatase which is more pronounced than other hepatic enzyme elevations. 2. Numerous hepatic cysts and signs of hepatic and splenic iron deposition. 3. Trace ascites about the liver. 4. No signs of biliary ductal dilation or filling defect. 5. Sludge layering in a mildly distended gallbladder.   01/14/2020 Initial Biopsy   DIAGNOSIS: 01/14/20  BONE MARROW, ASPIRATE, CLOT, CORE:  - Metastatic carcinoma, see comment.   PERIPHERAL BLOOD:  - Normocytic anemia.  - Thrombocytopenia.   COMMENT:  The carcinoma is poorly differentiated and the immunoprofile is somewhat   non-specific perhaps due to decalcification. However, given the focal  neuroendocrine and prostate markers, a prostate primary should be  considered.    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  I have reviewed the past medical history, past surgical history, social history and family history with the patient and they are unchanged from previous note.   PHYSICAL EXAMINATION: ECOG PERFORMANCE STATUS: 2 - Symptomatic, <50% confined to bed  Vitals:   01/29/20 1552 01/29/20 1700  BP: (!) 113/97 116/82  Pulse: (!) 111 (!) 109  Resp: 18 18  Temp: 100.3 F (37.9 C) 99.2 F (37.3 C)  SpO2: 100% 99%   Filed Weights   01/28/20 2211 01/29/20 0000  Weight: 158 lb (71.7 kg) 155 lb 3.3 oz (70.4 kg)    Intake/Output from previous day: 02/18 0701 - 02/19 0700 In: 3781.2 [I.V.:619.5; Blood:710; IV Piggyback:2451.7] Out: 425 [Urine:425]  GENERAL: Chronically ill-appearing male, no distress OROPHARYNX:no exudate, no erythema and lips, buccal mucosa, and tongue normal  LYMPH:  no palpable lymphadenopathy in the cervical, axillary or inguinal LUNGS: clear to auscultation and percussion with normal breathing effort HEART: Tachycardic, no murmurs and no lower extremity edema ABDOMEN:abdomen soft, non-tender and normal bowel sounds Musculoskeletal:no cyanosis of digits and no clubbing  NEURO: alert & oriented x 3 with fluent speech, no focal motor/sensory deficits  LABORATORY DATA:  I have reviewed the data as listed CMP Latest Ref Rng & Units 01/29/2020 01/28/2020 01/14/2020  Glucose 70 - 99 mg/dL 101(H) 225(H) -  BUN 8 - 23 mg/dL 41(H) 37(H) -  Creatinine 0.61 - 1.24 mg/dL 2.14(H)  2.42(H) -  Sodium 135 - 145 mmol/L 140 138 -  Potassium 3.5 - 5.1 mmol/L 4.3 4.4 -  Chloride 98 - 111 mmol/L 110 108 -  CO2 22 - 32 mmol/L 14(L) 15(L) -  Calcium 8.9 - 10.3 mg/dL 6.0(LL) 5.5(LL) -  Total Protein  6.5 - 8.1 g/dL 6.5 5.9(L) -  Total Bilirubin 0.3 - 1.2 mg/dL 3.2(H) 1.4(H) -  Alkaline Phos 38 - 126 U/L 979(H) 870(H) 529(H)  AST 15 - 41 U/L 217(H) 235(H) -  ALT 0 - 44 U/L 139(H) 132(H) -    Lab Results  Component Value Date   WBC 7.8 01/29/2020   HGB 10.4 (L) 01/29/2020   HCT 32.4 (L) 01/29/2020   MCV 84.8 01/29/2020   PLT 122 (L) 01/29/2020   NEUTROABS 6.0 01/29/2020    CT CHEST WO CONTRAST  Result Date: 01/29/2020 CLINICAL DATA:  Elevated alkaline phosphatase. 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 Unknown primary. EXAM: CT CHEST WITHOUT CONTRAST TECHNIQUE: Multidetector CT imaging of the chest was performed following the standard protocol without IV contrast. COMPARISON:  Chest radiograph 01/28/2020.  Abdominal MRI 01/06/2020. FINDINGS: Cardiovascular: Mild to moderate motion degradation throughout. Aortic atherosclerosis. Tortuous thoracic aorta. Mild cardiomegaly, without pericardial effusion. Multivessel coronary artery atherosclerosis. Mediastinum/Nodes: No mediastinal or definite hilar adenopathy, given limitations of unenhanced CT. Tiny hiatal hernia. Lungs/Pleura: Small bilateral pleural effusions. Right minor fissure thickening on 60/7. Bibasilar atelectasis. Areas of smooth septal thickening are mild and most apparent at the lung apices. No dominant pulmonary nodule or mass. Upper Abdomen: Low-density liver lesions or characterized as cysts on prior dedicated MRI. Motion degradation containing in the upper abdomen. Incompletely imaged mildly distended gallbladder. Normal imaged portions of the stomach pancreas, adrenal glands. Musculoskeletal: Mild left gynecomastia. Anasarca. Diffuse sclerotic osseous metastasis. No vertebral body height loss. IMPRESSION: 1. Mild to moderate motion degradation.Given this limitation, no dominant lung mass. 2. Bilateral pleural effusions with mild septal thickening at the apices. Question mild fluid overload/congestive heart failure. 3. Diffuse  sclerotic osseous metastasis. This consistent with prostate primary, given PSA level of greater than 1,100 earlier today. 4. Gallbladder distension, incompletely imaged. Electronically Signed   By: Abigail Miyamoto M.D.   On: 01/29/2020 15:59   MR PELVIS W WO CONTRAST  Result Date: 01/29/2020 CLINICAL DATA:  Osseous metastatic disease. Elevated alkaline phosphatase. EXAM: MRI PELVIS WITHOUT AND WITH CONTRAST TECHNIQUE: Multiplanar multisequence MR imaging of the pelvis was performed both before and after administration of intravenous contrast. CONTRAST:  34m GADAVIST GADOBUTROL 1 MMOL/ML IV SOLN COMPARISON:  Abdominal MRI 01/06/2020. FINDINGS: Bones/Joint/Cartilage There is widespread heterogeneous marrow replacement throughout the pelvis, the visualized bilateral proximal femora, and lower lumbar spine. Heterogeneous enhancement throughout the marrow structures. No pathologic fracture is identified. No enhancing extraosseous soft tissue mass is seen. Trace bilateral hip joint effusions, nonspecific. 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 Pubic symphysis and SI joints are intact. Ligaments Grossly intact. Muscles and Tendons Mild diffuse intramuscular edema throughout the pelvic and visualized thigh musculature bilaterally. Tendinous structures about the pelvis appear grossly intact. Soft tissues Enlarged, heterogeneous prostate gland measuring 3.8 x 5.3 x 4.4 cm. Enhancing tumor with the mid gland to prostate apex on the left with restricted diffusion (series 12, images 21-22). Findings concerning for invasion of the outer surface of the adjacent urinary bladder (series 13, image 24; series 12, image 20). There is also a adjacent abnormal left internal iliac chain pelvic lymph node (series 13, image 17), likely a site of metastatic disease. Foley  catheter is present within the urinary bladder. Trace free fluid within the pelvis. Mild diffuse soft tissue/body wall edema. No soft tissue fluid  collections. IMPRESSION: 1. Enhancing tumor with the mid gland to prostate apex on the left with restricted diffusion suggesting high grade tumor. Findings concerning for invasion of the outer surface of the adjacent urinary bladder wall. There is also a adjacent abnormal left internal iliac chain pelvic lymph node, likely a site of metastatic disease. 2. Diffuse heterogeneous marrow replacement throughout the pelvis, visualized bilateral proximal femora, and lower lumbar spine, consistent with widespread osseous metastatic disease. No pathologic fracture. 3. Mild diffuse anasarca and small amount of free fluid within the pelvis. These results will be called to the ordering clinician or representative by the Radiologist Assistant, and communication documented in the PACS or zVision Dashboard. Electronically Signed   By: Davina Poke D.O.   On: 01/29/2020 16:10   DG Chest Port 1 View  Result Date: 01/28/2020 CLINICAL DATA:  Fever. EXAM: PORTABLE CHEST 1 VIEW COMPARISON:  January 05, 2020 FINDINGS: Mild cardiomegaly. The hila and mediastinum are unremarkable. No pneumothorax. No nodules or masses. No focal infiltrates. Mild interstitial prominence without overt edema. IMPRESSION: Mild interstitial prominence could be due to the portable technique versus pulmonary venous congestion. No focal infiltrates or other acute abnormalities. Electronically Signed   By: Dorise Bullion III M.D   On: 01/28/2020 17:37   DG Chest Port 1 View  Result Date: 01/05/2020 CLINICAL DATA:  Weakness EXAM: PORTABLE CHEST 1 VIEW COMPARISON:  None. FINDINGS: The heart size and mediastinal contours are within normal limits. No focal airspace consolidation, pleural effusion, or pneumothorax. The visualized skeletal structures are unremarkable. IMPRESSION: No active disease. Electronically Signed   By: Davina Poke D.O.   On: 01/05/2020 14:15   CT BONE MARROW BIOPSY & ASPIRATION  Result Date: 01/14/2020 INDICATION: 74 year old  with anemia and thrombocytopenia. Abnormal appearance of the bone marrow on MRI. EXAM: CT GUIDED BONE MARROW ASPIRATES AND BIOPSY Physician: Stephan Minister. Anselm Pancoast, MD MEDICATIONS: None. ANESTHESIA/SEDATION: Fentanyl 50 mcg IV; Versed 0.5 mg IV Moderate Sedation Time:  17 minutes The patient was continuously monitored during the procedure by the interventional radiology nurse under my direct supervision. COMPLICATIONS: None immediate. PROCEDURE: The procedure was explained to the patient. The risks and benefits of the procedure were discussed and the patient's questions were addressed. Informed consent was obtained from the patient. The patient was placed prone on CT table. Images of the pelvis were obtained. The right side of back was prepped and draped in sterile fashion. The skin and right posterior ilium were anesthetized with 1% lidocaine. 11 gauge bone needle was directed into the right ilium with CT guidance. Initially, no aspirate could be obtained. Therefore, a core biopsy was obtained. Needle was directed back into the right ilium and approximately 1-2 mL of aspirate could be obtained. Second core biopsy was obtained. Bandage placed over the puncture site. FINDINGS: Pelvic bones are very heterogeneous with scattered areas of sclerosis. Minimal aspirate could be obtained from the bone marrow. Two adequate core biopsies were obtained. IMPRESSION: CT guided bone marrow aspiration and core biopsy. Electronically Signed   By: Markus Daft M.D.   On: 01/14/2020 13:59   MR ABDOMEN MRCP W WO CONTAST  Result Date: 01/06/2020 CLINICAL DATA:  Painless jaundice and weight loss. EXAM: MRI ABDOMEN WITHOUT AND WITH CONTRAST (INCLUDING MRCP) TECHNIQUE: Multiplanar multisequence MR imaging of the abdomen was performed both before and after the administration of intravenous  contrast. Heavily T2-weighted images of the biliary and pancreatic ducts were obtained, and three-dimensional MRCP images were rendered by post processing.  CONTRAST:  59m GADAVIST GADOBUTROL 1 MMOL/ML IV SOLN COMPARISON:  Ultrasound evaluation 01/05/2020 FINDINGS: Lower chest: Assessment of the lung bases is limited on MR. No signs of consolidation or pleural effusion. Hepatobiliary: Numerous hepatic cysts, signs of hepatic iron deposition. Is. Sludge layering in the dependent gallbladder. No signs of filling defect in the biliary tree. Pancreas: Limited assessment due to respiratory motion. No signs of ductal dilation or focal lesion. Arterial phase imaging is showing reasonable quality. Spleen:  Normal size spleen with signs of splenic iron deposition. Adrenals/Urinary Tract: Normal adrenals. Symmetric enhancement of bilateral kidneys. No signs of hydronephrosis. Stomach/Bowel: Visualized gastrointestinal tract is unremarkable with very limited assessment due to respiratory motion and without protocol for bowel assessment on MR. Vascular/Lymphatic: Patent abdominal vasculature. No signs of adenopathy. Other:  Trace ascites about the liver. Musculoskeletal: Very heterogeneous marrow signal with signs of iron deposition in the marrow spaces. Assessment on in and out of phase is limited. Scattered areas of relative restricted diffusion are noted though diffusion is limited in the setting of iron deposition. Heterogeneous patchy marrow space enhancement is noted throughout. IMPRESSION: 1. Marked heterogeneity of the visualized marrow spaces of the spine and pelvis. Areas of restricted diffusion and abnormal enhancement on a background of iron deposition. Correlate with any history of chronic anemia and exogenous iron administration or transfusions. Findings raise the question of process such as multiple myeloma, perhaps this would explain the elevated alkaline phosphatase which is more pronounced than other hepatic enzyme elevations. 2. Numerous hepatic cysts and signs of hepatic and splenic iron deposition. 3. Trace ascites about the liver. 4. No signs of biliary  ductal dilation or filling defect. 5. Sludge layering in a mildly distended gallbladder. Electronically Signed   By: GZetta BillsM.D.   On: 01/06/2020 19:56   ECHOCARDIOGRAM COMPLETE  Result Date: 01/06/2020   ECHOCARDIOGRAM REPORT   Patient Name:   LMARVELLE CAUDILLDate of Exam: 01/06/2020 Medical Rec #:  [id number]    Height:       69.8 in Accession #:    [id number]   Weight:       194.5 lb Date of Birth:  1September 28, 1947   BSA:          2.06 m Patient Age:    89years      BP:           112/70 mmHg Patient Gender: M             HR:           120 bpm. Exam Location:  Inpatient Procedure: 2D Echo Indications:    Atrial fibrillation  History:        Patient has no prior history of Echocardiogram examinations.                 Risk Factors:Hypertension and Dyslipidemia.  Sonographer:    AClayton LefortRDCS (AE) Referring Phys: [id number] 1. Left ventricular ejection fraction, by visual estimation, is 55 to 60%. The left ventricle has normal function. There is mildly increased left ventricular hypertrophy.  2. Left ventricular diastolic function could not be evaluated.  3. The left ventricle has no regional wall motion abnormalities.  4. Global right ventricle has normal systolic function.The right ventricular size is normal. No increase in right ventricular wall thickness.  5. Left  atrial size was normal.  6. Right atrial size was normal.  7. Mild mitral annular calcification.  8. The mitral valve is grossly normal. Mild mitral valve regurgitation.  9. The tricuspid valve is normal in structure. 10. The tricuspid valve is normal in structure. Tricuspid valve regurgitation is trivial. 11. The aortic valve is tricuspid. Aortic valve regurgitation is not visualized. 12. The pulmonic valve was not well visualized. Pulmonic valve regurgitation is not visualized. 13. The inferior vena cava is normal in size with greater than 50% respiratory variability, suggesting right atrial pressure of 3 mmHg.  FINDINGS  Left Ventricle: Left ventricular ejection fraction, by visual estimation, is 55 to 60%. The left ventricle has normal function. The left ventricle has no regional wall motion abnormalities. There is mildly increased left ventricular hypertrophy. Concentric left ventricular hypertrophy. The left ventricular diastology could not be evaluated due to nondiagnostic images. Left ventricular diastolic function could not be evaluated. Right Ventricle: The right ventricular size is normal. No increase in right ventricular wall thickness. Global RV systolic function is has normal systolic function. Left Atrium: Left atrial size was normal in size. Right Atrium: Right atrial size was normal in size Pericardium: There is no evidence of pericardial effusion. Mitral Valve: The mitral valve is grossly normal. There is mild thickening of the mitral valve leaflet(s). There is mild calcification of the mitral valve leaflet(s). Mild mitral annular calcification. Mild mitral valve regurgitation. Tricuspid Valve: The tricuspid valve is normal in structure. Tricuspid valve regurgitation is trivial. Aortic Valve: The aortic valve is tricuspid. . There is mild thickening and mild calcification of the aortic valve. Aortic valve regurgitation is not visualized. There is mild thickening of the aortic valve. There is mild calcification of the aortic valve. Pulmonic Valve: The pulmonic valve was not well visualized. Pulmonic valve regurgitation is not visualized. Pulmonic regurgitation is not visualized. Aorta: The aortic root, ascending aorta and aortic arch are all structurally normal, with no evidence of dilitation or obstruction. Pulmonary Artery: The pulmonary artery is not well seen. Venous: The inferior vena cava is normal in size with greater than 50% respiratory variability, suggesting right atrial pressure of 3 mmHg. IAS/Shunts: No atrial level shunt detected by color flow Doppler.  LEFT VENTRICLE PLAX 2D LVIDd:         4.70  cm LVIDs:         3.30 cm LV PW:         1.25 cm LV IVS:        1.40 cm LVOT diam:     2.00 cm LV SV:         58 ml LV SV Index:   27.70 LVOT Area:     3.14 cm  RIGHT VENTRICLE             IVC RV Basal diam:  3.10 cm     IVC diam: 1.20 cm RV S prime:     15.70 cm/s TAPSE (M-mode): 2.3 cm LEFT ATRIUM           Index       RIGHT ATRIUM           Index LA diam:      3.10 cm 1.51 cm/m  RA Area:     15.80 cm LA Vol (A2C): 42.8 ml 20.80 ml/m RA Volume:   40.20 ml  19.54 ml/m LA Vol (A4C): 53.2 ml 25.85 ml/m  AORTIC VALVE LVOT Vmax:   125.00 cm/s LVOT Vmean:  80.740 cm/s LVOT VTI:  0.227 m  AORTA Ao Root diam: 3.30 cm Ao Asc diam:  3.10 cm  SHUNTS Systemic VTI:  0.23 m Systemic Diam: 2.00 cm  Buford Dresser MD Electronically signed by Buford Dresser MD Signature Date/Time: 01/06/2020/1:14:41 PM    Final    US Abdomen Limited RUQ  Result Date: 01/05/2020 CLINICAL DATA:  Abdominal pain for 2 weeks EXAM: ULTRASOUND ABDOMEN LIMITED RIGHT UPPER QUADRANT COMPARISON:  None. Chest x-ray 01/05/2020 FINDINGS: Gallbladder: Slightly distended. Moderate sludge in the gallbladder. No shadowing stones. Normal wall thickness. Negative sonographic Murphy Common bile duct: Diameter: 6 mm Liver: Liver is slightly echogenic. Multiple cysts within the liver. The largest is seen in the posterior right hepatic lobe and measures 2.3 x 1.8 x 1.6 cm. Portal vein is patent on color Doppler imaging with normal direction of blood flow towards the liver. Other: None. IMPRESSION: 1. Slightly distended gallbladder with sludge, but negative for wall thickness, sonographic Murphy, or other features to suggest acute gallbladder disease. Common duct diameter upper limits of normal 2. Slightly echogenic liver with multiple cysts Electronically Signed   By: Donavan Foil M.D.   On: 01/05/2020 18:07    ASSESSMENT AND PLAN: 1.  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  Septic shock secondary to UTI 3.  Elevated alkaline  phosphatase 4.  Acute on chronic anemia 5.  AKI 6.  Coagulopathy 7.  Hypocalcemia 8.  Paroxysmal atrial fibrillation  -The patient was seen during his previous hospital admission due to an elevated alkaline phosphatase and abnormal MRI.  Bone marrow biopsy was performed which was consistent with poorly differentiated carcinoma.  Primary is unknown at this time.  Suspect this could be a prostate cancer.  I have added on a PSA to his a.m. labs.  He was scheduled to have a CT of the chest without contrast as well as an MRI of the pelvis with and without contrast for further evaluation.  I have spoken with his hospitalist and he is currently medically stable.  Therefore, I will request the CT of the chest without contrast and MRI of the pelvis with and without contrast performed in the next 1 to 2 days. -The patient is status post 2 units PRBCs.  Hemoglobin has improved.  Recommend transfusion to keep his hemoglobin above 8.  Suspect anemia is related to underlying malignancy and bone marrow involvement as well as acute infection. -He is worsening renal insufficiency this admission.  Suspect he is dehydrated.  Continue IV fluids.  We will follow-up on the PSA results and imaging results as soon as they are available to [place].   LOS: 1 day   Mikey Bussing, DNP, AGPCNP-BC, AOCNP 01/29/20   Addendum  I have seen the patient, examined him. I agree with the assessment and and plan and have edited the notes.   Pt is better today, hypotension resolved, he was oriented, but lethargic. Blood culture positive for Enterococcal bacteriemia, probably secondary to his UTI.   I have reviewed his pelvic MRI and CT chest, the images findings and significantly elevated PSA (>1000) are consistent primary prostate cancer. His anemia is probably related to his diffuse bone metastasis.   I discussed the above with pt. His prostate cancer is not curable, but but treatable.  Recommend starting Lupron and Casodex  tomorrow.  Orders are placed, I spoke with pharmacy also.I plan to start him on abiraterone as outpt. I do not think he is a candidate for chemo at this point.   I called his  son, and left him a detailed voice message about above and asked him to call me back.   Please consult urology.  I do not think he needs prostate biopsy, however given the invasion of the bladder, will get them on board also.   I will f/u on Monday, please call my on-call partner of needed over the weekend.   Truitt Merle  01/29/2020 5:12 PM

## 2020-01-29 NOTE — Progress Notes (Addendum)
   01/29/20 1552  Vitals  Temp 100.3 F (37.9 C)  Temp Source Oral  BP (!) 113/97  MAP (mmHg) 101  BP Location Left Arm  BP Method Automatic  Patient Position (if appropriate) Lying  Pulse Rate (!) 111  Pulse Rate Source Monitor  Resp 18  Oxygen Therapy  SpO2 100 %  O2 Device Room Air   Dr. [place] made aware

## 2020-01-30 DIAGNOSIS — Z7189 Other specified counseling: Secondary | ICD-10-CM

## 2020-01-30 DIAGNOSIS — Z515 Encounter for palliative care: Secondary | ICD-10-CM

## 2020-01-30 DIAGNOSIS — R531 Weakness: Secondary | ICD-10-CM

## 2020-01-30 LAB — PROTIME-INR
INR: 1.8 — ABNORMAL HIGH (ref 0.8–1.2)
Prothrombin Time: 20.7 seconds — ABNORMAL HIGH (ref 11.4–15.2)

## 2020-01-30 LAB — TYPE AND SCREEN
ABO/RH(D): B NEG
Antibody Screen: NEGATIVE
Unit division: 0
Unit division: 0

## 2020-01-30 LAB — COMPREHENSIVE METABOLIC PANEL
ALT: 107 U/L — ABNORMAL HIGH (ref 0–44)
AST: 129 U/L — ABNORMAL HIGH (ref 15–41)
Albumin: 1.5 g/dL — ABNORMAL LOW (ref 3.5–5.0)
Alkaline Phosphatase: 763 U/L — ABNORMAL HIGH (ref 38–126)
Anion gap: 11 (ref 5–15)
BUN: 38 mg/dL — ABNORMAL HIGH (ref 8–23)
CO2: 16 mmol/L — ABNORMAL LOW (ref 22–32)
Calcium: 6.1 mg/dL — CL (ref 8.9–10.3)
Chloride: 115 mmol/L — ABNORMAL HIGH (ref 98–111)
Creatinine, Ser: 1.66 mg/dL — ABNORMAL HIGH (ref 0.61–1.24)
GFR calc Af Amer: 46 mL/min — ABNORMAL LOW (ref >60)
GFR calc non Af Amer: 40 mL/min — ABNORMAL LOW (ref >60)
Glucose, Bld: 118 mg/dL — ABNORMAL HIGH (ref 70–99)
Potassium: 3.5 mmol/L (ref 3.5–5.1)
Sodium: 142 mmol/L (ref 135–145)
Total Bilirubin: 2.5 mg/dL — ABNORMAL HIGH (ref 0.3–1.2)
Total Protein: 5.1 g/dL — ABNORMAL LOW (ref 6.5–8.1)

## 2020-01-30 LAB — LACTIC ACID, PLASMA: Lactic Acid, Venous: 1.3 mmol/L (ref 0.5–1.9)

## 2020-01-30 LAB — CBC
HCT: 24.9 % — ABNORMAL LOW (ref 39.0–52.0)
Hemoglobin: 8.5 g/dL — ABNORMAL LOW (ref 13.0–17.0)
MCH: 28 pg (ref 26.0–34.0)
MCHC: 34.1 g/dL (ref 30.0–36.0)
MCV: 81.9 fL (ref 80.0–100.0)
Platelets: 84 10*3/uL — ABNORMAL LOW (ref 150–400)
RBC: 3.04 MIL/uL — ABNORMAL LOW (ref 4.22–5.81)
RDW: 18.9 % — ABNORMAL HIGH (ref 11.5–15.5)
WBC: 8 10*3/uL (ref 4.0–10.5)
nRBC: 2.2 % — ABNORMAL HIGH (ref 0.0–0.2)

## 2020-01-30 LAB — BPAM RBC
Blood Product Expiration Date: 202103082359
Blood Product Expiration Date: 202103092359
ISSUE DATE / TIME: 202102182224
ISSUE DATE / TIME: 202102190213
Unit Type and Rh: 1700
Unit Type and Rh: 1700

## 2020-01-30 LAB — MAGNESIUM: Magnesium: 1.8 mg/dL (ref 1.7–2.4)

## 2020-01-30 LAB — CALCIUM, IONIZED: Calcium, Ionized, Serum: 3.3 mg/dL — ABNORMAL LOW (ref 4.5–5.6)

## 2020-01-30 NOTE — Plan of Care (Signed)
Patient up to chair with moderate assist, very unsteady, worked with PT as well.  Patient does quite well drinking fluids such as Ensure, Breeze and juices however continues with poor solid food intake.  No complaints of pain, nausea, other during 7 a to 7p shift.  Talked to several siblings on the phone.

## 2020-01-30 NOTE — Consult Note (Signed)
Consultation Note Date: 01/30/2020   Patient Name: Frank Daugherty  DOB: May 13, 1946  MRN: [id number]  Age / Sex: 75 y.o., male  PCP: Patient, No Pcp Per Referring Physician: [place] Eric J, DO  Reason for Consultation: Establishing goals of care  HPI/Patient Profile: 74 y.o. 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  Patient has been admitted to hospital medicine service with septic shock, E coli septicemia, shock liver acute renal failure and anemia. He has been seen by ID and oncology. It is suspected that the patient has prostate cancer primary.   Palliative consult for goals of care has been requested.   Frank Daugherty is resting in bed. He is in no distress. He has not yet worked on his breakfast, he just doesn't feel hungry these days. I introduced myself and palliative care as follows:  Palliative medicine is specialized medical care for people living with serious illness. It focuses on providing relief from the symptoms and stress of a serious illness. The goal is to improve quality of life for both the patient and the family.  Goals of care: Broad aims of medical therapy in relation to the patient's values and preferences. Our aim is to provide medical care aimed at enabling patients to achieve the goals that matter most to them, given the circumstances of their particular medical situation and their constraints.   Patient is aware of the nature of this admission and his serious illness diagnosis. "they told me I have cancer. They are trying to do what they can."  Brief life  review performed, also discussed with patient about code status and broad goals of care including disposition options. Patient is on IV antibiotics, he has also been started on cancer treatment with Casodex and Lupron.   Encouraged patient to be OOB, participate with PT/OT and PO intake.   NEXT OF KIN  has 2 sons, used live alone in [place], [place], prior to his current stay at [facility], is divorced from his wife, used to drive a cab.    SUMMARY OF RECOMMENDATIONS    patient will discuss with his sons, especially his son Frank Daugherty about his code status. We reviewed in detail about full code versus DNR DNI. Continue current mode of care for now Recommend SNF rehab with palliative care on discharge Thank you for the consult.   Code Status/Advance Care Planning:  Full code    Symptom Management:    continue current mode of care, patient denies pain, denies shortness of breath. Admits to general weakness.   Palliative Prophylaxis:   Delirium Protocol  Additional Recommendations (Limitations, Scope, Preferences):  Full Scope Treatment  Psycho-social/Spiritual:   Desire for further Chaplaincy support:yes  Additional Recommendations: Caregiving  Support/Resources  Prognosis:  Unable to determine  Discharge Planning: To Be Determined      Primary Diagnoses: Present on Admission: . Severe sepsis with septic shock ([place]) . Acute lower UTI . Elevated lactic acid level . Acute on chronic anemia . Acute metabolic encephalopathy . AKI (acute kidney injury) ([place]) . Hypocalcemia . Transaminitis . Coagulopathy ([place])   I have reviewed the medical record, interviewed the patient and family, and examined the patient. The following aspects are pertinent.  Past Medical History:  Diagnosis Date  . Chronic anemia   . Hypertension    Social History   Socioeconomic History  . Marital status: Married    Spouse name: Not on file  . Number of children: Not on file  . Years  of education: Not on file  . Highest education level: Not on file  Occupational History  . Not on file  Tobacco Use  . Smoking status: Never Smoker  . Smokeless tobacco: Never Used  Substance and Sexual Activity  . Alcohol use: Yes    Alcohol/week: 4.0 standard drinks    Types: 4 Cans of beer per week  . Drug use: Never  . Sexual activity: Not on file  Other Topics Concern  . Not on file  Social History Narrative  . Not on file   Social Determinants of Health   Financial Resource Strain:   . Difficulty of Paying Living Expenses: Not on file  Food Insecurity:   . Worried About Charity fundraiser in the Last Year: Not on file  . Ran Out of Food in the Last Year: Not on file  Transportation Needs:   . Lack of Transportation (Medical): Not on file  . Lack of Transportation (Non-Medical): Not on file  Physical Activity:   . Days of Exercise per Week: Not on file  . Minutes of Exercise per Session: Not on file  Stress:   . Feeling of Stress : Not on file  Social Connections:   . Frequency of Communication with Friends and Family: Not on file  . Frequency of Social Gatherings with Friends and Family: Not on file  . Attends Religious Services: Not on file  . Active Member of Clubs or Organizations: Not on file  . Attends Archivist Meetings: Not on file  . Marital Status: Not on file   Family History  Family history unknown: Yes   Scheduled Meds: . bicalutamide  50 mg Oral Daily  . Chlorhexidine Gluconate Cloth  6 each Topical Daily  . feeding supplement  1 Container Oral BID BM  . feeding supplement (ENSURE ENLIVE)  237 mL Oral BID BM  . mouth rinse  15 mL Mouth Rinse BID  . metoprolol tartrate  12.5 mg Oral BID  . multivitamin with minerals  1 tablet Oral Daily  . sodium chloride flush  3 mL Intravenous Q12H   Continuous Infusions: . sodium chloride 75 mL/hr at 01/30/20 0900  . piperacillin-tazobactam (ZOSYN)  IV 3.375 g (01/30/20 0506)   PRN  Meds:.acetaminophen **OR** acetaminophen Medications Prior to Admission:  Prior to Admission medications   Medication Sig Start Date End Date Taking? Authorizing Provider  ascorbic acid (VITAMIN C) 500 MG tablet Take 500 mg by mouth daily.   Yes [provider]  metoprolol succinate (TOPROL-XL) 100 MG 24 hr tablet Take 1 tablet (100 mg total) by mouth daily. Take with or immediately following a meal. 01/14/20 02/13/20 Yes Hollice Gong, Mir Mohammed, MD  tamsulosin (FLOMAX) 0.4 MG CAPS capsule Take 1 capsule (  0.4 mg total) by mouth daily after supper. 01/13/20 02/12/20 Yes Hollice Gong, Mir Mohammed, MD  pravastatin (PRAVACHOL) 40 MG tablet Take 1 tablet (40 mg total) by mouth daily. Patient not taking: Reported on 01/05/2020 04/23/12 04/23/13  Deneise Lever, MD   No Active Allergies Review of Systems +weakness  Physical Exam Awake alert Resting in bed S1 S2 Clear Abdomen not distended not tender No edema Is thin with some muscle wasting evident Non focal alert and oriented  Vital Signs: BP 123/90 (BP Location: Right Arm)   Pulse (!) 115   Temp 98.9 F (37.2 C) (Oral)   Resp (!) 22   Ht 5\' 11"  (1.803 m)   Wt 70.4 kg   SpO2 100%   BMI 21.65 kg/m  Pain Scale: 0-10   Pain Score: 0-No pain   SpO2: SpO2: 100 % O2 Device:SpO2: 100 % O2 Flow Rate: .   IO: Intake/output summary:   Intake/Output Summary (Last 24 hours) at 01/30/2020 1134 Last data filed at 01/30/2020 0600 Gross per 24 hour  Intake 1896.04 ml  Output 852 ml  Net 1044.04 ml    LBM: Last BM Date: 01/29/20 Baseline Weight: Weight: 71.7 kg Most recent weight: Weight: 70.4 kg     Palliative Assessment/Data:   PPS 40%  Time In:  10 Time Out:  11 Time Total:   60  Greater than 50%  of this time was spent counseling and coordinating care related to the above assessment and plan.  Signed by: Loistine Chance, MD   Please contact Palliative Medicine Team phone at [phone] for questions and concerns.  For  individual provider: See Shea Evans

## 2020-01-30 NOTE — Progress Notes (Signed)
Pt experienced a 6 beat run of non-sustained V-tach. On-call  Dr. Silas [place] informed.

## 2020-01-30 NOTE — Consult Note (Signed)
Urology Consult   Physician requesting consult: Dr. Eric [place]  Reason for consult: Metastatic prostate cancer and urinary retention  History of Present Illness: Frank Daugherty is a 74 y.o. who was hospitalized in January and noted to have an elevated alkaline phosphatase and concern for bone metastases from unknown primary.  Bone marrow biopsy was obtained with concern for possible multiple myeloma.  Pathology indicated metastatic carcinoma with possibility of prostate cancer with neuroendocrine differentiation.  He was discharged to a SNF and developed urinary retention requiring urethral catheterization.  He was then readmitted 2 days ago after developing septic shock with E coli bacteremia.  Urine culture demonstrated multiple species likely related to contamination but GU source most likely. Further evaluation with MRI of the pelvis and CT chest from 2/19 show diffuse bone metastases and MRI with enhancing prostate nodule with restricted diffusion consistent with high grade prostate cancer along with evidence of locally advanced disease with possible local extension into bladder. Also noted to have enlarged left pelvic lymphadenopathy. PSA noted to be 1,101.59.  He has been started on androgen deprivation with leuprolide and also bicalutamide for testosterone flare prevention.  Plans to begin abiraterone in near future per Dr. Burr Medico.   He has no known family history of prostate cancer.  He denies any pain symptoms.  He denies voiding problems prior to finding he was in retention at [facility].  Past Medical History:  Diagnosis Date  . Chronic anemia   . Hypertension     History reviewed. No pertinent surgical history.  Medications:  Home meds:  No current facility-administered medications on file prior to encounter.   Current Outpatient Medications on File Prior to Encounter  Medication Sig Dispense Refill  . ascorbic acid (VITAMIN C) 500 MG tablet Take 500 mg by mouth daily.    . metoprolol  succinate (TOPROL-XL) 100 MG 24 hr tablet Take 1 tablet (100 mg total) by mouth daily. Take with or immediately following a meal. 30 tablet 0  . tamsulosin (FLOMAX) 0.4 MG CAPS capsule Take 1 capsule (0.4 mg total) by mouth daily after supper. 30 capsule 0  . pravastatin (PRAVACHOL) 40 MG tablet Take 1 tablet (40 mg total) by mouth daily. (Patient not taking: Reported on 01/05/2020) 90 tablet 3     Scheduled Meds: . bicalutamide  50 mg Oral Daily  . Chlorhexidine Gluconate Cloth  6 each Topical Daily  . feeding supplement  1 Container Oral BID BM  . feeding supplement (ENSURE ENLIVE)  237 mL Oral BID BM  . mouth rinse  15 mL Mouth Rinse BID  . metoprolol tartrate  12.5 mg Oral BID  . multivitamin with minerals  1 tablet Oral Daily  . sodium chloride flush  3 mL Intravenous Q12H   Continuous Infusions: . sodium chloride 75 mL/hr at 01/30/20 0900  . piperacillin-tazobactam (ZOSYN)  IV 3.375 g (01/30/20 1152)   PRN Meds:.acetaminophen **OR** acetaminophen  Allergies: No Active Allergies  Family History  Family history unknown: Yes    Social History:  reports that he has never smoked. He has never used smokeless tobacco. He reports current alcohol use of about 4.0 standard drinks of alcohol per week. He reports that he does not use drugs.  ROS: A complete review of systems was performed.  All systems are negative except for pertinent findings as noted.  Physical Exam:  Vital signs in last 24 hours: Temp:  [98.1 F (36.7 C)-99.6 F (37.6 C)] 98.1 F (36.7 C) (02/20 1327) Pulse Rate:  [  97-115] 97 (02/20 1327) Resp:  [16-28] 17 (02/20 1327) BP: (99-124)/(75-93) 118/91 (02/20 1327) SpO2:  [91 %-100 %] 100 % (02/20 1327) Constitutional:  Alert and oriented, No acute distress Cardiovascular: No JVD Respiratory: Normal respiratory effort, Lungs clear bilaterally GI: Abdomen is soft, nontender, nondistended, no abdominal masses Genitourinary: No CVAT. Normal male phallus, testes are  descended bilaterally and non-tender and without masses, scrotum is normal in appearance without lesions or masses, perineum is normal on inspection.  Indwelling catheter with grossly clear urine. Rectal: Normal sphincter tone, prostate is very firm, indurated on entire left side with probable extraprostatic extension Lymphatic: No lymphadenopathy Neurologic: Grossly intact, no focal deficits Psychiatric: Normal mood and affect  Laboratory Data:  Recent Labs    01/28/20 1716 01/29/20 0718 01/30/20 0800  WBC 11.0* 7.8 8.0  HGB 6.7* 10.4* 8.5*  HCT 21.0* 32.4* 24.9*  PLT 135* 122* 84*    Recent Labs    01/28/20 1716 01/29/20 0718 01/30/20 0800  NA 138 140 142  K 4.4 4.3 3.5  CL 108 110 115*  GLUCOSE 225* 101* 118*  BUN 37* 41* 38*  CALCIUM 5.5* 6.0* 6.1*  CREATININE 2.42* 2.14* 1.66*     Results for orders placed or performed during the hospital encounter of 01/28/20 (from the past 24 hour(s))  Glucose, capillary     Status: Abnormal   Collection Time: 01/29/20 11:44 PM  Result Value Ref Range   Glucose-Capillary 123 (H) 70 - 99 mg/dL  Lactic acid, plasma     Status: None   Collection Time: 01/30/20  7:51 AM  Result Value Ref Range   Lactic Acid, Venous 1.3 0.5 - 1.9 mmol/L  CBC     Status: Abnormal   Collection Time: 01/30/20  8:00 AM  Result Value Ref Range   WBC 8.0 4.0 - 10.5 K/uL   RBC 3.04 (L) 4.22 - 5.81 MIL/uL   Hemoglobin 8.5 (L) 13.0 - 17.0 g/dL   HCT 24.9 (L) 39.0 - 52.0 %   MCV 81.9 80.0 - 100.0 fL   MCH 28.0 26.0 - 34.0 pg   MCHC 34.1 30.0 - 36.0 g/dL   RDW 18.9 (H) 11.5 - 15.5 %   Platelets 84 (L) 150 - 400 K/uL   nRBC 2.2 (H) 0.0 - 0.2 %  Comprehensive metabolic panel     Status: Abnormal   Collection Time: 01/30/20  8:00 AM  Result Value Ref Range   Sodium 142 135 - 145 mmol/L   Potassium 3.5 3.5 - 5.1 mmol/L   Chloride 115 (H) 98 - 111 mmol/L   CO2 16 (L) 22 - 32 mmol/L   Glucose, Bld 118 (H) 70 - 99 mg/dL   BUN 38 (H) 8 - 23 mg/dL    Creatinine, Ser 1.66 (H) 0.61 - 1.24 mg/dL   Calcium 6.1 (LL) 8.9 - 10.3 mg/dL   Total Protein 5.1 (L) 6.5 - 8.1 g/dL   Albumin 1.5 (L) 3.5 - 5.0 g/dL   AST 129 (H) 15 - 41 U/L   ALT 107 (H) 0 - 44 U/L   Alkaline Phosphatase 763 (H) 38 - 126 U/L   Total Bilirubin 2.5 (H) 0.3 - 1.2 mg/dL   GFR calc non Af Amer 40 (L) >60 mL/min   GFR calc Af Amer 46 (L) >60 mL/min   Anion gap 11 5 - 15  Magnesium     Status: None   Collection Time: 01/30/20  8:00 AM  Result Value Ref Range   Magnesium  1.8 1.7 - 2.4 mg/dL  Protime-INR     Status: Abnormal   Collection Time: 01/30/20  8:00 AM  Result Value Ref Range   Prothrombin Time 20.7 (H) 11.4 - 15.2 seconds   INR 1.8 (H) 0.8 - 1.2   Recent Results (from the past 240 hour(s))  Blood Culture (routine x 2)     Status: Abnormal (Preliminary result)   Collection Time: 01/28/20  5:08 PM   Specimen: BLOOD  Result Value Ref Range Status   Specimen Description   Final    BLOOD RIGHT ANTECUBITAL Performed at [place] [address] [place], [place] [address]    Special Requests   Final    BOTTLES DRAWN AEROBIC AND ANAEROBIC Blood Culture adequate volume Performed at [facility] [address] [place], [place] [address]    Culture  Setup Time   Final    GRAM NEGATIVE RODS IN BOTH AEROBIC AND ANAEROBIC BOTTLES CRITICAL RESULT CALLED TO, READ BACK BY AND VERIFIED WITH: PHARMD MARY _0  01/29/20 AKT Performed at [facility], [place] [address], [place], [place] [address]    Culture ESCHERICHIA COLI SUSCEPTIBILITIES TO FOLLOW  (A)  Final   Report Status PENDING  Incomplete  Blood Culture ID Panel (Reflexed)     Status: Abnormal   Collection Time: 01/28/20  5:08 PM  Result Value Ref Range Status   Enterococcus species DETECTED (A) NOT DETECTED Corrected    Comment: CRITICAL RESULT CALLED TO, READ BACK BY AND VERIFIED WITH: PHARMD MARY _1  01/29/20 AKT CORRECTED ON 02/19 AT 0936: PREVIOUSLY  REPORTED AS CRITICAL RESULT CALLED TO, READ BACK BY AND VERIFIED WITH: PHARMD MARY _2  01/29/20 AKT    Vancomycin resistance NOT DETECTED NOT DETECTED Final   Listeria monocytogenes NOT DETECTED NOT DETECTED Final   Staphylococcus species NOT DETECTED NOT DETECTED Final   Staphylococcus aureus (BCID) NOT DETECTED NOT DETECTED Final   Streptococcus species NOT DETECTED NOT DETECTED Final   Streptococcus agalactiae NOT DETECTED NOT DETECTED Final   Streptococcus pneumoniae NOT DETECTED NOT DETECTED Final   Streptococcus pyogenes NOT DETECTED NOT DETECTED Final   Acinetobacter baumannii NOT DETECTED NOT DETECTED Final   Enterobacteriaceae species DETECTED (A) NOT DETECTED Final    Comment: Enterobacteriaceae represent a large family of gram-negative bacteria, not a single organism. CRITICAL RESULT CALLED TO, READ BACK BY AND VERIFIED WITH: PHARMD MARY _3  01/29/20 AKT    Enterobacter cloacae complex NOT DETECTED NOT DETECTED Final   Escherichia coli DETECTED (A) NOT DETECTED Final    Comment: CRITICAL RESULT CALLED TO, READ BACK BY AND VERIFIED WITH: PHARMD MARY _4  01/29/20 AKT    Klebsiella oxytoca NOT DETECTED NOT DETECTED Final   Klebsiella pneumoniae NOT DETECTED NOT DETECTED Final   Proteus species NOT DETECTED NOT DETECTED Final   Serratia marcescens NOT DETECTED NOT DETECTED Final   Carbapenem resistance NOT DETECTED NOT DETECTED Final   Haemophilus influenzae NOT DETECTED NOT DETECTED Final   Neisseria meningitidis NOT DETECTED NOT DETECTED Final   Pseudomonas aeruginosa NOT DETECTED NOT DETECTED Final   Candida albicans NOT DETECTED NOT DETECTED Final   Candida glabrata NOT DETECTED NOT DETECTED Final   Candida krusei NOT DETECTED NOT DETECTED Final   Candida parapsilosis NOT DETECTED NOT DETECTED Final   Candida tropicalis NOT DETECTED NOT DETECTED Final    Comment: Performed at [facility], [place] [address], [place], [place] [address]  Blood Culture (routine x 2)  Status: None (Preliminary result)   Collection Time: 01/28/20  5:16 PM   Specimen: BLOOD LEFT HAND  Result Value Ref Range Status   Specimen Description   Final    BLOOD LEFT HAND Performed at [facility] [address] [place], [place] [address]    Special Requests   Final    BOTTLES DRAWN AEROBIC AND ANAEROBIC Blood Culture adequate volume Performed at [facility] [address], [place], [place] [address]    Culture  Setup Time   Final    GRAM NEGATIVE RODS IN BOTH AEROBIC AND ANAEROBIC BOTTLES CRITICAL VALUE NOTED.  VALUE IS CONSISTENT WITH PREVIOUSLY REPORTED AND CALLED VALUE. Performed at [facility], [place] [address] [place], [place] [address]    Culture GRAM NEGATIVE RODS IDENTIFICATION TO FOLLOW   Final   Report Status PENDING  Incomplete  Urine culture     Status: Abnormal   Collection Time: 01/28/20  5:54 PM   Specimen: In/Out Cath Urine  Result Value Ref Range Status   Specimen Description   Final    IN/OUT CATH URINE Performed at [facility], [place] [address], [place], [place] [address]    Special Requests   Final    NONE Performed at [facility], [place] [address] [place], [place] [address]    Culture MULTIPLE SPECIES PRESENT, SUGGEST RECOLLECTION (A)  Final   Report Status 01/29/2020 FINAL  Final  SARS CORONAVIRUS 2 (TAT 6-24 HRS) Nasopharyngeal Nasopharyngeal Swab     Status: None   Collection Time: 01/28/20  8:54 PM   Specimen: Nasopharyngeal Swab  Result Value Ref Range Status   SARS Coronavirus 2 NEGATIVE NEGATIVE Final    Comment: (NOTE) SARS-CoV-2 target nucleic acids are NOT DETECTED. The SARS-CoV-2 RNA is generally detectable in upper and lower respiratory specimens during the acute phase of infection. Negative results do not preclude SARS-CoV-2 infection, do not rule out co-infections with other pathogens, and should not be used as the sole basis for treatment  or other patient management decisions. Negative results must be combined with clinical observations, patient history, and epidemiological information. The expected result is Negative. Fact Sheet for Patients: SugarRoll.be Fact Sheet for Healthcare Providers: https://www.woods-mathews.com/ This test is not yet approved or cleared by the [place] FDA and  has been authorized for detection and/or diagnosis of SARS-CoV-2 by FDA under an Emergency Use Authorization (EUA). This EUA will remain  in effect (meaning this test can be used) for the duration of the COVID-19 declaration under Section 56 4(b)(1) of the Act, 21 U.S.C. section 360bbb-3(b)(1), unless the authorization is terminated or revoked sooner. Performed at [facility], [place] [address], [place], [place] [address]   MRSA PCR Screening     Status: None   Collection Time: 01/28/20 11:55 PM   Specimen: Nasal Mucosa; Nasopharyngeal  Result Value Ref Range Status   MRSA by PCR NEGATIVE NEGATIVE Final    Comment:        The GeneXpert MRSA Assay (FDA approved for NASAL specimens only), is one component of a comprehensive MRSA colonization surveillance program. It is not intended to diagnose MRSA infection nor to guide or monitor treatment for MRSA infections. Performed at [facility], [place] [address] [place], [place] [address]     Renal Function: Recent Labs    01/28/20 1716 01/29/20 0718 01/30/20 0800  CREATININE 2.42* 2.14* 1.66*   Estimated Creatinine Clearance: 38.9 mL/min (A) (by C-G  formula based on SCr of 1.66 mg/dL (H)).  Radiologic Imaging: CT CHEST WO CONTRAST  Result Date: 01/29/2020 CLINICAL DATA:  Elevated alkaline phosphatase. 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 Unknown primary. EXAM: CT CHEST WITHOUT CONTRAST TECHNIQUE: Multidetector CT imaging of the chest was performed following the standard protocol without IV contrast.  COMPARISON:  Chest radiograph 01/28/2020.  Abdominal MRI 01/06/2020. FINDINGS: Cardiovascular: Mild to moderate motion degradation throughout. Aortic atherosclerosis. Tortuous thoracic aorta. Mild cardiomegaly, without pericardial effusion. Multivessel coronary artery atherosclerosis. Mediastinum/Nodes: No mediastinal or definite hilar adenopathy, given limitations of unenhanced CT. Tiny hiatal hernia. Lungs/Pleura: Small bilateral pleural effusions. Right minor fissure thickening on 60/7. Bibasilar atelectasis. Areas of smooth septal thickening are mild and most apparent at the lung apices. No dominant pulmonary nodule or mass. Upper Abdomen: Low-density liver lesions or characterized as cysts on prior dedicated MRI. Motion degradation containing in the upper abdomen. Incompletely imaged mildly distended gallbladder. Normal imaged portions of the stomach pancreas, adrenal glands. Musculoskeletal: Mild left gynecomastia. Anasarca. Diffuse sclerotic osseous metastasis. No vertebral body height loss. IMPRESSION: 1. Mild to moderate motion degradation.Given this limitation, no dominant lung mass. 2. Bilateral pleural effusions with mild septal thickening at the apices. Question mild fluid overload/congestive heart failure. 3. Diffuse sclerotic osseous metastasis. This consistent with prostate primary, given PSA level of greater than 1,100 earlier today. 4. Gallbladder distension, incompletely imaged. Electronically Signed   By: Abigail Miyamoto M.D.   On: 01/29/2020 15:59   MR PELVIS W WO CONTRAST  Result Date: 01/29/2020 CLINICAL DATA:  Osseous metastatic disease. Elevated alkaline phosphatase. EXAM: MRI PELVIS WITHOUT AND WITH CONTRAST TECHNIQUE: Multiplanar multisequence MR imaging of the pelvis was performed both before and after administration of intravenous contrast. CONTRAST:  6m GADAVIST GADOBUTROL 1 MMOL/ML IV SOLN COMPARISON:  Abdominal MRI 01/06/2020. FINDINGS: Bones/Joint/Cartilage There is widespread  heterogeneous marrow replacement throughout the pelvis, the visualized bilateral proximal femora, and lower lumbar spine. Heterogeneous enhancement throughout the marrow structures. No pathologic fracture is identified. No enhancing extraosseous soft tissue mass is seen. Trace bilateral hip joint effusions, nonspecific. 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 Pubic symphysis and SI joints are intact. Ligaments Grossly intact. Muscles and Tendons Mild diffuse intramuscular edema throughout the pelvic and visualized thigh musculature bilaterally. Tendinous structures about the pelvis appear grossly intact. Soft tissues Enlarged, heterogeneous prostate gland measuring 3.8 x 5.3 x 4.4 cm. Enhancing tumor with the mid gland to prostate apex on the left with restricted diffusion (series 12, images 21-22). Findings concerning for invasion of the outer surface of the adjacent urinary bladder (series 13, image 24; series 12, image 20). There is also a adjacent abnormal left internal iliac chain pelvic lymph node (series 13, image 17), likely a site of metastatic disease. Foley catheter is present within the urinary bladder. Trace free fluid within the pelvis. Mild diffuse soft tissue/body wall edema. No soft tissue fluid collections. IMPRESSION: 1. Enhancing tumor with the mid gland to prostate apex on the left with restricted diffusion suggesting high grade tumor. Findings concerning for invasion of the outer surface of the adjacent urinary bladder wall. There is also a adjacent abnormal left internal iliac chain pelvic lymph node, likely a site of metastatic disease. 2. Diffuse heterogeneous marrow replacement throughout the pelvis, visualized bilateral proximal femora, and lower lumbar spine, consistent with widespread osseous metastatic disease. No pathologic fracture. 3. Mild diffuse anasarca and small amount of free fluid within the pelvis. These results will be called to the ordering clinician or  representative by  the Radiologist Assistant, and communication documented in the PACS or zVision Dashboard. Electronically Signed   By: Davina Poke D.O.   On: 01/29/2020 16:10   DG Chest Port 1 View  Result Date: 01/28/2020 CLINICAL DATA:  Fever. EXAM: PORTABLE CHEST 1 VIEW COMPARISON:  January 05, 2020 FINDINGS: Mild cardiomegaly. The hila and mediastinum are unremarkable. No pneumothorax. No nodules or masses. No focal infiltrates. Mild interstitial prominence without overt edema. IMPRESSION: Mild interstitial prominence could be due to the portable technique versus pulmonary venous congestion. No focal infiltrates or other acute abnormalities. Electronically Signed   By: Dorise Bullion III M.D   On: 01/28/2020 17:37    I independently reviewed the above imaging studies.  Impression/Recommendation 1) E coli bacteremia likely from GU source: Continue appropriate culture specific antibiotic therapy once all cultures are final. 2) Metastatic prostate cancer: High volume metastatic prostate cancer.  Diagnosis is almost certain with biopsy of bone, rectal exam findings, and abnormal DRE.  Will not need further diagnostic evaluation.  Agree with ADT and abiraterone for initial treatment per Dr. Burr Medico.  3) Urinary retention: Likely related to locally advanced prostate cancer.  Leave indwelling catheter for now and will consider outpatient voiding trial as outpatient pending renal function stabilization and treatment of GU infection.  May require channel TURP to relieve bladder outlet obstruction eventually but this can be performed in a delayed fashion after stabilization of all medical issues and treatment for his metastatic disease has been started.  Dutch Gray 01/30/2020, 5:00 PM    Pryor Curia MD  CC: Dr. Eric [place] Dr. Truitt Merle

## 2020-01-30 NOTE — Evaluation (Signed)
Physical Therapy Evaluation Patient Details Name: Frank Daugherty MRN: [id number] DOB: [birth year] Today's Date: 01/30/2020   History of Present Illness  74yo male presenting from [facility] with septic shock secondary to UTI, acute encephalopathy, and hypotension. 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 PMH A-fib with RVR, liver disease with coagulopathy, HTN, chronic anemia  Clinical Impression   Patient received in bed, very flat affect and soft spoken but calm and cooperative with PT today. See below for assist/mobility levels. He is well known to this therapist from his recent stay at [facility]- definitely much more confused and with greater need for increased levels of assist even with basic levels of mobility today. HR to 128BPM just with standing beside the bed for pericare. Seemed to have a hard time with motor planning and sequencing today, needed Mod cues for mobility and step by step directions throughout session. Deferred gait due to elevated HR. He was left in bed with nursing staff present and attending, bed alarm active. Recommend return to SNF moving forward.     Follow Up Recommendations SNF;Supervision/Assistance - 24 hour    Equipment Recommendations  Rolling walker with 5" wheels;3in1 (PT)    Recommendations for Other Services       Precautions / Restrictions Precautions Precautions: Fall Precaution Comments: watch HR, bone mets Restrictions Weight Bearing Restrictions: No      Mobility  Bed Mobility Overal bed mobility: Needs Assistance Bed Mobility: Supine to Sit;Sit to Supine     Supine to sit: Min assist Sit to supine: Min guard   General bed mobility comments: MinA/Mod cues to get to EOB, seemed to have a hard time processing exactly what PT wanted; once at EOB initally leaned on R elbow then straightened up  Transfers Overall transfer level: Needs assistance Equipment used: Rolling walker (2 wheeled) Transfers: Sit  to/from Stand Sit to Stand: Mod assist         General transfer comment: ModA to boost to full standing position, then able to maintain balance with min guard and RW. HR to 128BPM just standing beside bed.  Ambulation/Gait             General Gait Details: deferred- elevated HR  Stairs            Wheelchair Mobility    Modified Rankin (Stroke Patients Only)       Balance Overall balance assessment: Needs assistance Sitting-balance support: No upper extremity supported;Feet supported Sitting balance-Leahy Scale: Good Sitting balance - Comments: min guard for safety, tended to unpredictably lean down to R elbow today Postural control: Right lateral lean Standing balance support: Bilateral upper extremity supported;During functional activity Standing balance-Leahy Scale: Poor Standing balance comment: heavy reliance on BUE support on RW, min guard for safety                             Pertinent Vitals/Pain Pain Assessment: Faces Pain Score: 0-No pain Faces Pain Scale: No hurt Pain Intervention(s): Monitored during session;Limited activity within patient's tolerance    Home Living Family/patient expects to be discharged to:: Skilled nursing facility Living Arrangements: Alone   Type of Home: House Home Access: Stairs to enter Entrance Stairs-Rails: Can reach both Entrance Stairs-Number of Steps: 3-4 Home Layout: One level Home Equipment: Grab bars - tub/shower;Hand held shower head      Prior Function Level of Independence: Independent  Hand Dominance   Dominant Hand: Right    Extremity/Trunk Assessment   Upper Extremity Assessment Upper Extremity Assessment: Defer to OT evaluation    Lower Extremity Assessment Lower Extremity Assessment: Generalized weakness    Cervical / Trunk Assessment Cervical / Trunk Assessment: Normal  Communication   Communication: No difficulties  Cognition Arousal/Alertness:  Awake/alert Behavior During Therapy: Flat affect Overall Cognitive Status: No family/caregiver present to determine baseline cognitive functioning                                 General Comments: very flat affect, cooperative but soft spoken, needed Mod cues to follow commands today. Mildly impulsive. Very poor safety awareness. Needs increased time for processing.      General Comments General comments (skin integrity, edema, etc.): HR to 128BPM standing beside bed for pericare    Exercises     Assessment/Plan    PT Assessment Patient needs continued PT services  PT Problem List Decreased strength;Decreased knowledge of use of DME;Decreased activity tolerance;Decreased safety awareness;Decreased balance;Decreased mobility;Decreased coordination       PT Treatment Interventions DME instruction;Balance training;Gait training;Stair training;Functional mobility training;Patient/family education;Therapeutic activities;Therapeutic exercise    PT Goals (Current goals can be found in the Care Plan section)  Acute Rehab PT Goals Patient Stated Goal: feel better PT Goal Formulation: With patient Time For Goal Achievement: 02/13/20 Potential to Achieve Goals: Fair    Frequency Min 2X/week   Barriers to discharge        Co-evaluation               AM-PAC PT "6 Clicks" Mobility  Outcome Measure Help needed turning from your back to your side while in a flat bed without using bedrails?: A Little Help needed moving from lying on your back to sitting on the side of a flat bed without using bedrails?: A Lot Help needed moving to and from a bed to a chair (including a wheelchair)?: A Little Help needed standing up from a chair using your arms (e.g., wheelchair or bedside chair)?: A Lot Help needed to walk in hospital room?: A Little Help needed climbing 3-5 steps with a railing? : A Lot 6 Click Score: 15    End of Session   Activity Tolerance: Patient tolerated  treatment well Patient left: in bed;with call bell/phone within reach;with bed alarm set;with nursing/sitter in room Nurse Communication: Mobility status;Other (comment)(HR with mobility, incontinent BM) PT Visit Diagnosis: Unsteadiness on feet (R26.81);Other abnormalities of gait and mobility (R26.89);Difficulty in walking, not elsewhere classified (R26.2);Muscle weakness (generalized) (M62.81)    Time: [id number] PT Time Calculation (min) (ACUTE ONLY): 38 min   Charges:   PT Evaluation $PT Eval Moderate Complexity: 1 Mod PT Treatments $Therapeutic Activity: 23-37 mins        Windell Norfolk, DPT, PN1   Supplemental Physical Therapist [place]    Pager [phone] Acute Rehab Office [phone]

## 2020-01-30 NOTE — Progress Notes (Addendum)
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  In the ED, temperature one 1.5, HR 111, BP 82/68, RR 21, SPO2 94% on room air.  Creatinine 2.42 (was 0.8 on 01/13/2020), calcium 5.5 corrected for albumin of 1.827.3.  Alkaline phosphatase 870, AST 235, ALT 132, total bilirubin 1.4, WBC count 11, hemoglobin 6.7, platelets 135, INR 2.1, urinalysis with large leukoesterase, many bacteria, negative nitrite, greater than 50 WBCs.  Chest x-ray with no acute cardiopulmonary disease process.  Patient was given 2.5 L NS bolus, started on cefepime and vancomycin and 1 unit PRBC ordered for transfusion.  Patient referred for admission for septic shock secondary to urinary source.   Assessment & Plan:   Principal Problem:   Severe sepsis with septic shock (HCC) Active Problems:   Coagulopathy (HCC)   Acute lower UTI   Elevated lactic acid level   Acute on chronic anemia   Acute metabolic encephalopathy   AKI (acute kidney injury) ([place])   Hypocalcemia   Transaminitis   Septic shock; present on admission E. coli, Enterococcus septicemia Lactic acidosis Patient presenting from SNF with 2-day history of worsening mental status, fever, and weakness.  Patient was febrile up to 1-1.5 with a blood pressure of 73/36 on admission.  Elevated lactic acid of 5.8 with underlying AKI and likely shock liver.  Blood cultures positive for Enterococcus and E. coli.  Received 2.5 L NS bolus in ED. Foley catheter exchanged --Infectious disease following, appreciate assistance --WBC 11.0-->7.8-->8.0 --Lactic acid  5.8-->3.5-->1.3 --Vancomycin/cefepime changed to Zosyn per ID --Await susceptibilities of blood cultures from 2/18 --Blood cultures repeated 01/30/2020 --Continue supportive care, antiemetics, antipyretics --Continue close monitoring of blood pressure; 111/80 this morning --Follow CBC daily, repeat lactic acid in a.m.  Elevated LFTs likely secondary to shock liver Elevated bilirubin Patient was noted to have an elevated AST of 235 and ALT of 132 on presentation with a total bilirubin of 1.4.  Etiology likely secondary to shock liver with significant hypotension in setting of sepsis as above.  Acute hepatitis panel negative. --AST 235-->217-->129 --ALT 132-->139-->107 --Tbili 1.4-->3.2-->2.5 --Continue to trend CMP daily --Continue IV antibiotics and supportive care as above  Acute renal failure Creatinine 2.42 on presentation. FeNA 0.1%, consistent with prerenal.  Also suspect possible some ATN secondary to septic shock with hypotension as above.  Complicated by chronic indwelling Foley catheter with obstructive uropathy.  Foley catheter exchanged on 01/29/2020. --Cr 2.42-->2.14-->1.66 --Avoid nephrotoxins, renally dose all medications --Continue IV fluids with NS at 75 mL's per hour --Follow renal function daily  Paroxysmal atrial fibrillation CHA2DS2-VASc score of 2.  Given history of coagulopathy, risks versus benefits discussions were performed during previous hospitalization with decision to refrain from anticoagulation.  On Toprol-XL 100 mg p.o. daily at home. --Currently in NSR, continue monitor on telemetry --Started on metoprolol tartrate 12.5 mg p.o. twice daily with holding parameters  Acute on chronic anemia Baseline hemoglobin 8-10.  Etiology likely secondary to multifactorial with hypoproliferative in the setting of metastatic disease to the bone in addition to hepatic insufficiency.  Hemoglobin on presentation 6.7, most recent prior value of 8.5 on 01/14/2020.  No obvious  signs of blood loss/bleeding.  Iron level 22, TIBC 166, ferritin greater than 7500. --s/p 1u pRBC on 2/18 --Hgb 6.7-->10.4-->8.5 --continue to monitor hemoglobin closely, transfuse for hemoglobin <7.0 or active bleeding  Coagulopathy INR elevated 2.2, likely secondary to suspected decreased hepatic synthetic function due to metastatic disease.  No signs of active bleeding. --INR 2.2-->1.8 --Continue to monitor INR and CBC daily  Hypocalcemia Calcium 6.1 with albumin of 1.5, corrected for hypoalbuminemia, Ca = 8.1.  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 Burr Medico.  PSA 1,101.  MR pelvis with IV contrast notable for enhancing tumor mid to apex prostate with invasion of bladder wall consistent with likely prostate primary.  CT chest with no obvious lung mass but with bilateral pleural effusions and diffuse sclerotic osseous metastasis.  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   Consultants:   Medical oncology - Dr. Burr Medico  Infectious disease - Dr. Johnnye Sima  Urology - Dr. Alinda Money  Palliative care: Consult pending  Procedures:   None  Antimicrobials:   Vancomycin 2/18 - 2/19  Cefepime 2/18 - 2/19  Zosyn 2/19>>   Subjective: Patient seen and examined bedside, sitting in bedside chair.  No  specific complaints this morning, pain controlled.  Continues with poor oral intake per nursing.  Denies headache, no chest pain, no shortness of breath, no abdominal pain.  No acute events overnight per nursing staff.  Objective: Vitals:   01/29/20 2207 01/30/20 0223 01/30/20 0528 01/30/20 0909  BP: (!) 124/93 114/83 111/80 123/90  Pulse: (!) 102 (!) 106 (!) 102 (!) 115  Resp: 20 20 (!) 28 (!) 22  Temp: 98.6 F (37 C) 99.6 F (37.6 C) 98.7 F (37.1 C) 98.9 F (37.2 C)  TempSrc: Oral Oral Oral Oral  SpO2: 91% 98% 97% 100%  Weight:      Height:        Intake/Output Summary (Last 24 hours) at 01/30/2020 1015 Last data filed at 01/30/2020 0600 Gross per 24 hour  Intake 1896.04 ml  Output 852 ml  Net 1044.04 ml   Filed Weights   01/28/20 2211 01/29/20 0000  Weight: 71.7 kg 70.4 kg    Examination:  General exam: Appears calm and comfortable, thin/cachectic in appearance Respiratory system: Clear to auscultation. Respiratory effort normal. Cardiovascular system: S1 & S2 heard, RRR. No JVD, murmurs, rubs, gallops or clicks. No pedal edema. Gastrointestinal system: Abdomen is nondistended, soft and nontender. No organomegaly or masses felt. Normal bowel sounds heard. GU: Foley catheter noted in place draining clear yellow urine Central nervous system: Alert and oriented. No focal neurological deficits. Extremities: Symmetric 5 x 5 power. Skin: No rashes, lesions or ulcers Psychiatry: Judgement and insight appear poor. Mood & affect appropriate.     Data Reviewed: I have personally reviewed following labs and imaging studies  CBC: Recent Labs  Lab 01/28/20 1716 01/29/20 0718 01/30/20 0800  WBC 11.0* 7.8 8.0  NEUTROABS 8.4* 6.0  --   HGB 6.7* 10.4* 8.5*  HCT 21.0* 32.4* 24.9*  MCV  84.3 84.8 81.9  PLT 135* 122* 84*   Basic Metabolic Panel: Recent Labs  Lab 01/28/20 1716 01/28/20 1738 01/29/20 0718 01/30/20 0800  NA 138  --  140 142  K 4.4  --  4.3 3.5  CL 108   --  110 115*  CO2 15*  --  14* 16*  GLUCOSE 225*  --  101* 118*  BUN 37*  --  41* 38*  CREATININE 2.42*  --  2.14* 1.66*  CALCIUM 5.5*  --  6.0* 6.1*  MG  --  1.9 1.9 1.8   GFR: Estimated Creatinine Clearance: 38.9 mL/min (A) (by C-G formula based on SCr of 1.66 mg/dL (H)). Liver Function Tests: Recent Labs  Lab 01/28/20 1716 01/29/20 0718 01/30/20 0800  AST 235* 217* 129*  ALT 132* 139* 107*  ALKPHOS 870* 979* 763*  BILITOT 1.4* 3.2* 2.5*  PROT 5.9* 6.5 5.1*  ALBUMIN 1.8* 2.1* 1.5*   No results for input(s): LIPASE, AMYLASE in the last 168 hours. Recent Labs  Lab 01/29/20 0100  AMMONIA 15   Coagulation Profile: Recent Labs  Lab 01/28/20 1716 01/29/20 0100 01/30/20 0800  INR 2.1* 2.2* 1.8*   Cardiac Enzymes: No results for input(s): CKTOTAL, CKMB, CKMBINDEX, TROPONINI in the last 168 hours. BNP (last 3 results) No results for input(s): PROBNP in the last 8760 hours. HbA1C: No results for input(s): HGBA1C in the last 72 hours. CBG: Recent Labs  Lab 01/29/20 2344  GLUCAP 123*   Lipid Profile: No results for input(s): CHOL, HDL, LDLCALC, TRIG, CHOLHDL, LDLDIRECT in the last 72 hours. Thyroid Function Tests: Recent Labs    01/29/20 0100  TSH 1.978   Anemia Panel: Recent Labs    01/28/20 1716 01/28/20 2200  FERRITIN  --  >7,500*  TIBC  --  166*  IRON  --  22*  RETICCTPCT 2.1  --    Sepsis Labs: Recent Labs  Lab 01/28/20 1716 01/28/20 1941 01/29/20 0100 01/30/20 0751  LATICACIDVEN 5.8* 5.8* 3.5* 1.3    Recent Results (from the past 240 hour(s))  Blood Culture (routine x 2)     Status: Abnormal (Preliminary result)   Collection Time: 01/28/20  5:08 PM   Specimen: BLOOD  Result Value Ref Range Status   Specimen Description   Final    BLOOD RIGHT ANTECUBITAL Performed at [facility] [place], [place] [address] [place], [place] [address]    Special Requests   Final    BOTTLES DRAWN AEROBIC AND ANAEROBIC Blood Culture adequate  volume Performed at [place] [address], [place], [place] [address]    Culture  Setup Time   Final    GRAM NEGATIVE RODS IN BOTH AEROBIC AND ANAEROBIC BOTTLES CRITICAL RESULT CALLED TO, READ BACK BY AND VERIFIED WITH: PHARMD MARY @0829  01/29/20 AKT Performed at [facility], [place] [address], [place], [place] [address]    Culture ESCHERICHIA COLI SUSCEPTIBILITIES TO FOLLOW  (A)  Final   Report Status PENDING  Incomplete  Blood Culture ID Panel (Reflexed)     Status: Abnormal   Collection Time: 01/28/20  5:08 PM  Result Value Ref Range Status   Enterococcus species DETECTED (A) NOT DETECTED Corrected    Comment: CRITICAL RESULT CALLED TO, READ BACK BY AND VERIFIED WITH: PHARMD MARY @0829  01/29/20 AKT CORRECTED ON 02/19 AT 0936: PREVIOUSLY REPORTED AS CRITICAL RESULT CALLED TO, READ BACK BY AND VERIFIED WITH: PHARMD MARY @0829  01/29/20 AKT    Vancomycin resistance NOT DETECTED  NOT DETECTED Final   Listeria monocytogenes NOT DETECTED NOT DETECTED Final   Staphylococcus species NOT DETECTED NOT DETECTED Final   Staphylococcus aureus (BCID) NOT DETECTED NOT DETECTED Final   Streptococcus species NOT DETECTED NOT DETECTED Final   Streptococcus agalactiae NOT DETECTED NOT DETECTED Final   Streptococcus pneumoniae NOT DETECTED NOT DETECTED Final   Streptococcus pyogenes NOT DETECTED NOT DETECTED Final   Acinetobacter baumannii NOT DETECTED NOT DETECTED Final   Enterobacteriaceae species DETECTED (A) NOT DETECTED Final    Comment: Enterobacteriaceae represent a large family of gram-negative bacteria, not a single organism. CRITICAL RESULT CALLED TO, READ BACK BY AND VERIFIED WITH: PHARMD MARY @0829  01/29/20 AKT    Enterobacter cloacae complex NOT DETECTED NOT DETECTED Final   Escherichia coli DETECTED (A) NOT DETECTED Final    Comment: CRITICAL RESULT CALLED TO, READ BACK BY AND VERIFIED WITH: PHARMD MARY @0829  01/29/20 AKT    Klebsiella oxytoca NOT DETECTED  NOT DETECTED Final   Klebsiella pneumoniae NOT DETECTED NOT DETECTED Final   Proteus species NOT DETECTED NOT DETECTED Final   Serratia marcescens NOT DETECTED NOT DETECTED Final   Carbapenem resistance NOT DETECTED NOT DETECTED Final   Haemophilus influenzae NOT DETECTED NOT DETECTED Final   Neisseria meningitidis NOT DETECTED NOT DETECTED Final   Pseudomonas aeruginosa NOT DETECTED NOT DETECTED Final   Candida albicans NOT DETECTED NOT DETECTED Final   Candida glabrata NOT DETECTED NOT DETECTED Final   Candida krusei NOT DETECTED NOT DETECTED Final   Candida parapsilosis NOT DETECTED NOT DETECTED Final   Candida tropicalis NOT DETECTED NOT DETECTED Final    Comment: Performed at [facility], [place] [address] [place], [place] [address]  Blood Culture (routine x 2)     Status: None (Preliminary result)   Collection Time: 01/28/20  5:16 PM   Specimen: BLOOD LEFT HAND  Result Value Ref Range Status   Specimen Description   Final    BLOOD LEFT HAND Performed at [place] [address] [place], [place] [address]    Special Requests   Final    BOTTLES DRAWN AEROBIC AND ANAEROBIC Blood Culture adequate volume Performed at [place] [address] [place], [place] [address]    Culture  Setup Time   Final    GRAM NEGATIVE RODS IN BOTH AEROBIC AND ANAEROBIC BOTTLES CRITICAL VALUE NOTED.  VALUE IS CONSISTENT WITH PREVIOUSLY REPORTED AND CALLED VALUE. Performed at [facility], [place] [address] [place], [place] [address]    Culture GRAM NEGATIVE RODS IDENTIFICATION TO FOLLOW   Final   Report Status PENDING  Incomplete  Urine culture     Status: Abnormal   Collection Time: 01/28/20  5:54 PM   Specimen: In/Out Cath Urine  Result Value Ref Range Status   Specimen Description   Final    IN/OUT CATH URINE Performed at [facility], [place] [address], [place], [place] [address]    Special Requests   Final     NONE Performed at [facility], [place] [address] [place],  [address]    Culture MULTIPLE SPECIES PRESENT, SUGGEST RECOLLECTION (A)  Final   Report Status 01/29/2020 FINAL  Final  SARS CORONAVIRUS 2 (TAT 6-24 HRS) Nasopharyngeal Nasopharyngeal Swab     Status: None   Collection Time: 01/28/20  8:54 PM   Specimen: Nasopharyngeal Swab  Result Value Ref Range Status   SARS Coronavirus 2 NEGATIVE NEGATIVE Final    Comment: (  NOTE) SARS-CoV-2 target nucleic acids are NOT DETECTED. The SARS-CoV-2 RNA is generally detectable in upper and lower respiratory specimens during the acute phase of infection. Negative results do not preclude SARS-CoV-2 infection, do not rule out co-infections with other pathogens, and should not be used as the sole basis for treatment or other patient management decisions. Negative results must be combined with clinical observations, patient history, and epidemiological information. The expected result is Negative. Fact Sheet for Patients: SugarRoll.be Fact Sheet for Healthcare Providers: https://www.woods-mathews.com/ This test is not yet approved or cleared by the [place] FDA and  has been authorized for detection and/or diagnosis of SARS-CoV-2 by FDA under an Emergency Use Authorization (EUA). This EUA will remain  in effect (meaning this test can be used) for the duration of the COVID-19 declaration under Section 56 4(b)(1) of the Act, 21 U.S.C. section 360bbb-3(b)(1), unless the authorization is terminated or revoked sooner. Performed at [facility], [place] [address], [place], [place] [address]   MRSA PCR Screening     Status: None   Collection Time: 01/28/20 11:55 PM   Specimen: Nasal Mucosa; Nasopharyngeal  Result Value Ref Range Status   MRSA by PCR NEGATIVE NEGATIVE Final    Comment:        The GeneXpert MRSA Assay (FDA approved for NASAL specimens only), is one component of  a comprehensive MRSA colonization surveillance program. It is not intended to diagnose MRSA infection nor to guide or monitor treatment for MRSA infections. Performed at [facility], [place] [address] [place], [place] [address]          Radiology Studies: CT CHEST WO CONTRAST  Result Date: 01/29/2020 CLINICAL DATA:  Elevated alkaline phosphatase. 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 Unknown primary. EXAM: CT CHEST WITHOUT CONTRAST TECHNIQUE: Multidetector CT imaging of the chest was performed following the standard protocol without IV contrast. COMPARISON:  Chest radiograph 01/28/2020.  Abdominal MRI 01/06/2020. FINDINGS: Cardiovascular: Mild to moderate motion degradation throughout. Aortic atherosclerosis. Tortuous thoracic aorta. Mild cardiomegaly, without pericardial effusion. Multivessel coronary artery atherosclerosis. Mediastinum/Nodes: No mediastinal or definite hilar adenopathy, given limitations of unenhanced CT. Tiny hiatal hernia. Lungs/Pleura: Small bilateral pleural effusions. Right minor fissure thickening on 60/7. Bibasilar atelectasis. Areas of smooth septal thickening are mild and most apparent at the lung apices. No dominant pulmonary nodule or mass. Upper Abdomen: Low-density liver lesions or characterized as cysts on prior dedicated MRI. Motion degradation containing in the upper abdomen. Incompletely imaged mildly distended gallbladder. Normal imaged portions of the stomach pancreas, adrenal glands. Musculoskeletal: Mild left gynecomastia. Anasarca. Diffuse sclerotic osseous metastasis. No vertebral body height loss. IMPRESSION: 1. Mild to moderate motion degradation.Given this limitation, no dominant lung mass. 2. Bilateral pleural effusions with mild septal thickening at the apices. Question mild fluid overload/congestive heart failure. 3. Diffuse sclerotic osseous metastasis. This consistent with prostate primary, given PSA level of greater than 1,100  earlier today. 4. Gallbladder distension, incompletely imaged. Electronically Signed   By: Abigail Miyamoto M.D.   On: 01/29/2020 15:59   MR PELVIS W WO CONTRAST  Result Date: 01/29/2020 CLINICAL DATA:  Osseous metastatic disease. Elevated alkaline phosphatase. EXAM: MRI PELVIS WITHOUT AND WITH CONTRAST TECHNIQUE: Multiplanar multisequence MR imaging of the pelvis was performed both before and after administration of intravenous contrast. CONTRAST:  47mL GADAVIST GADOBUTROL 1 MMOL/ML IV SOLN COMPARISON:  Abdominal MRI 01/06/2020. FINDINGS: Bones/Joint/Cartilage There is widespread heterogeneous marrow replacement throughout the pelvis, the visualized bilateral proximal femora, and lower lumbar spine. Heterogeneous enhancement  throughout the marrow structures. No pathologic fracture is identified. No enhancing extraosseous soft tissue mass is seen. Trace bilateral hip joint effusions, nonspecific. 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 Pubic symphysis and SI joints are intact. Ligaments Grossly intact. Muscles and Tendons Mild diffuse intramuscular edema throughout the pelvic and visualized thigh musculature bilaterally. Tendinous structures about the pelvis appear grossly intact. Soft tissues Enlarged, heterogeneous prostate gland measuring 3.8 x 5.3 x 4.4 cm. Enhancing tumor with the mid gland to prostate apex on the left with restricted diffusion (series 12, images 21-22). Findings concerning for invasion of the outer surface of the adjacent urinary bladder (series 13, image 24; series 12, image 20). There is also a adjacent abnormal left internal iliac chain pelvic lymph node (series 13, image 17), likely a site of metastatic disease. Foley catheter is present within the urinary bladder. Trace free fluid within the pelvis. Mild diffuse soft tissue/body wall edema. No soft tissue fluid collections. IMPRESSION: 1. Enhancing tumor with the mid gland to prostate apex on the left with restricted diffusion  suggesting high grade tumor. Findings concerning for invasion of the outer surface of the adjacent urinary bladder wall. There is also a adjacent abnormal left internal iliac chain pelvic lymph node, likely a site of metastatic disease. 2. Diffuse heterogeneous marrow replacement throughout the pelvis, visualized bilateral proximal femora, and lower lumbar spine, consistent with widespread osseous metastatic disease. No pathologic fracture. 3. Mild diffuse anasarca and small amount of free fluid within the pelvis. These results will be called to the ordering clinician or representative by the Radiologist Assistant, and communication documented in the PACS or zVision Dashboard. Electronically Signed   By: Davina Poke D.O.   On: 01/29/2020 16:10   DG Chest Port 1 View  Result Date: 01/28/2020 CLINICAL DATA:  Fever. EXAM: PORTABLE CHEST 1 VIEW COMPARISON:  January 05, 2020 FINDINGS: Mild cardiomegaly. The hila and mediastinum are unremarkable. No pneumothorax. No nodules or masses. No focal infiltrates. Mild interstitial prominence without overt edema. IMPRESSION: Mild interstitial prominence could be due to the portable technique versus pulmonary venous congestion. No focal infiltrates or other acute abnormalities. Electronically Signed   By: Dorise Bullion III M.D   On: 01/28/2020 17:37        Scheduled Meds: . bicalutamide  50 mg Oral Daily  . Chlorhexidine Gluconate Cloth  6 each Topical Daily  . feeding supplement  1 Container Oral BID BM  . feeding supplement (ENSURE ENLIVE)  237 mL Oral BID BM  . mouth rinse  15 mL Mouth Rinse BID  . metoprolol tartrate  12.5 mg Oral BID  . multivitamin with minerals  1 tablet Oral Daily  . sodium chloride flush  3 mL Intravenous Q12H   Continuous Infusions: . sodium chloride 75 mL/hr at 01/30/20 0900  . piperacillin-tazobactam (ZOSYN)  IV 3.375 g (01/30/20 0506)     LOS: 2 days    Time spent: 39 minutes spent on chart review, discussion with  nursing staff, consultants, updating family and interview/physical exam; more than 50% of that time was spent in counseling and/or coordination of care.    Laporsha Grealish J [place] DO [facility] Available via Epic secure chat 7am-7pm After these hours, please refer to coverage provider listed on [email or web address] 01/30/2020, 10:15 AM

## 2020-01-30 NOTE — Evaluation (Signed)
Occupational Therapy Evaluation Patient Details Name: Frank Daugherty MRN: [id number] DOB: 07-Mar-1946 Today's Date: 01/30/2020    History of Present Illness 74yo male presenting from [facility] with septic shock secondary to UTI, acute encephalopathy, and hypotension. 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 PMH A-fib with RVR, liver disease with coagulopathy, HTN, chronic anemia   Clinical Impression   Pt admitted with sepsis. Pt currently with functional limitations due to the deficits listed below (see OT Problem List).  Pt will benefit from skilled OT to increase their safety and independence with ADL and functional mobility for ADL to facilitate discharge to venue listed below.     Follow Up Recommendations  SNF;Supervision/Assistance - 24 hour    Equipment Recommendations  None recommended by OT    Recommendations for Other Services       Precautions / Restrictions Precautions Precautions: Fall Precaution Comments: watch HR, bone mets Restrictions Weight Bearing Restrictions: No      Mobility Bed Mobility Overal bed mobility: Needs Assistance Bed Mobility: Supine to Sit;Sit to Supine     Supine to sit: Min assist Sit to supine: Min assist   General bed mobility comments: MinA/Mod cues to get to EOB, seemed to have a hard time processing exactly what PT wanted; once at EOB initally leaned on R elbow then straightened up  Transfers Overall transfer level: Needs assistance Equipment used: Rolling walker (2 wheeled) Transfers: Sit to/from Stand Sit to Stand: Mod assist         General transfer comment: ModA to boost to full standing position, then able to maintain balance with min guard and RW. HR to 128BPM just standing beside bed.    Balance Overall balance assessment: Needs assistance Sitting-balance support: No upper extremity supported;Feet supported Sitting balance-Leahy Scale: Fair Sitting balance - Comments: min  guard for safety, tended to unpredictably lean down to R elbow today Postural control: Right lateral lean Standing balance support: Bilateral upper extremity supported;During functional activity Standing balance-Leahy Scale: Poor Standing balance comment: heavy reliance on BUE support on RW, min guard for safety                           ADL either performed or assessed with clinical judgement   ADL Overall ADL's : Needs assistance/impaired Eating/Feeding: Sitting;Minimal assistance   Grooming: Wash/dry hands;Wash/dry face;Min guard;Sitting     Upper Body Bathing Details (indicate cue type and reason): simulated Lower Body Bathing: Moderate assistance;Sit to/from stand;Sitting/lateral leans Lower Body Bathing Details (indicate cue type and reason): simulated, pt unsteady     Lower Body Dressing: Minimal assistance;Sit to/from stand Lower Body Dressing Details (indicate cue type and reason): pt unsteady Toilet Transfer: Min guard;Ambulation;Grab bars;Cueing for safety   Toileting- Clothing Manipulation and Hygiene: Sit to/from stand;Moderate assistance;Sitting/lateral lean       Functional mobility during ADLs: Min guard;Cueing for safety General ADL Comments: =     Vision Patient Visual Report: No change from baseline              Pertinent Vitals/Pain Pain Assessment: No/denies pain Pain Score: 0-No pain Faces Pain Scale: No hurt Pain Intervention(s): Monitored during session;Limited activity within patient's tolerance     Hand Dominance Right   Extremity/Trunk Assessment Upper Extremity Assessment Upper Extremity Assessment: Generalized weakness   Lower Extremity Assessment Lower Extremity Assessment: Generalized weakness   Cervical / Trunk Assessment Cervical / Trunk Assessment: Normal   Communication Communication Communication:  No difficulties   Cognition Arousal/Alertness: Awake/alert Behavior During Therapy: Flat affect Overall Cognitive  Status: No family/caregiver present to determine baseline cognitive functioning                                 General Comments: very flat affect, cooperative but soft spoken, needed Mod cues to follow commands today. Mildly impulsive. Very poor safety awareness. Needs increased time for processing.   General Comments  HR to 128BPM standing beside bed for pericare            Home Living Family/patient expects to be discharged to:: Skilled nursing facility Living Arrangements: Alone   Type of Home: House Home Access: Stairs to enter Entrance Stairs-Number of Steps: 3-4 Entrance Stairs-Rails: Can reach both Home Layout: One level     Bathroom Shower/Tub: Walk-in shower;Door   Bathroom Toilet: Handicapped height     Home Equipment: Grab bars - tub/shower;Hand held shower head          Prior Functioning/Environment Level of Independence: Independent                 OT Problem List: Decreased strength;Decreased activity tolerance;Impaired balance (sitting and/or standing)      OT Treatment/Interventions: Self-care/ADL training;DME and/or AE instruction;Patient/family education;Balance training    OT Goals(Current goals can be found in the care plan section) Acute Rehab OT Goals Patient Stated Goal: feel better OT Goal Formulation: With patient Time For Goal Achievement: 02/06/20 Potential to Achieve Goals: Good ADL Goals Pt Will Perform Grooming: with supervision;standing Pt Will Perform Lower Body Dressing: with supervision;sit to/from stand Pt Will Transfer to Toilet: with supervision;stand pivot transfer;bedside commode Pt Will Perform Toileting - Clothing Manipulation and hygiene: with supervision;sit to/from stand  OT Frequency: Min 2X/week   Barriers to D/C: Decreased caregiver support          Co-evaluation              AM-PAC OT "6 Clicks" Daily Activity     Outcome Measure Help from another person eating meals?: A  Little Help from another person taking care of personal grooming?: A Little Help from another person toileting, which includes using toliet, bedpan, or urinal?: A Lot Help from another person bathing (including washing, rinsing, drying)?: A Little Help from another person to put on and taking off regular upper body clothing?: A Lot Help from another person to put on and taking off regular lower body clothing?: A Little 6 Click Score: 16   End of Session Equipment Utilized During Treatment: Gait belt  Activity Tolerance: Patient tolerated treatment well Patient left: with call bell/phone within reach;in bed;with bed alarm set  OT Visit Diagnosis: Unsteadiness on feet (R26.81);Muscle weakness (generalized) (M62.81);History of falling (Z91.81)                Time: [id number] OT Time Calculation (min): 13 min Charges:  OT General Charges $OT Visit: 1 Visit OT Evaluation $OT Eval Moderate Complexity: 1 Mod  Kari Baars, OT Acute Rehabilitation Services [phone] Office- [phone]     Griffon Herberg, Edwena Felty D 01/30/2020, 2:17 PM

## 2020-01-31 LAB — MAGNESIUM: Magnesium: 2 mg/dL (ref 1.7–2.4)

## 2020-01-31 LAB — CBC
HCT: 25.8 % — ABNORMAL LOW (ref 39.0–52.0)
Hemoglobin: 8.6 g/dL — ABNORMAL LOW (ref 13.0–17.0)
MCH: 27.7 pg (ref 26.0–34.0)
MCHC: 33.3 g/dL (ref 30.0–36.0)
MCV: 83 fL (ref 80.0–100.0)
Platelets: 77 10*3/uL — ABNORMAL LOW (ref 150–400)
RBC: 3.11 MIL/uL — ABNORMAL LOW (ref 4.22–5.81)
RDW: 19.2 % — ABNORMAL HIGH (ref 11.5–15.5)
WBC: 8.9 10*3/uL (ref 4.0–10.5)
nRBC: 2.1 % — ABNORMAL HIGH (ref 0.0–0.2)

## 2020-01-31 LAB — COMPREHENSIVE METABOLIC PANEL
ALT: 96 U/L — ABNORMAL HIGH (ref 0–44)
AST: 96 U/L — ABNORMAL HIGH (ref 15–41)
Albumin: 1.4 g/dL — ABNORMAL LOW (ref 3.5–5.0)
Alkaline Phosphatase: 752 U/L — ABNORMAL HIGH (ref 38–126)
Anion gap: 9 (ref 5–15)
BUN: 32 mg/dL — ABNORMAL HIGH (ref 8–23)
CO2: 19 mmol/L — ABNORMAL LOW (ref 22–32)
Calcium: 6.8 mg/dL — ABNORMAL LOW (ref 8.9–10.3)
Chloride: 115 mmol/L — ABNORMAL HIGH (ref 98–111)
Creatinine, Ser: 1.43 mg/dL — ABNORMAL HIGH (ref 0.61–1.24)
GFR calc Af Amer: 56 mL/min — ABNORMAL LOW (ref >60)
GFR calc non Af Amer: 48 mL/min — ABNORMAL LOW (ref >60)
Glucose, Bld: 113 mg/dL — ABNORMAL HIGH (ref 70–99)
Potassium: 3.5 mmol/L (ref 3.5–5.1)
Sodium: 143 mmol/L (ref 135–145)
Total Bilirubin: 3.1 mg/dL — ABNORMAL HIGH (ref 0.3–1.2)
Total Protein: 5 g/dL — ABNORMAL LOW (ref 6.5–8.1)

## 2020-01-31 LAB — PROTIME-INR
INR: 1.7 — ABNORMAL HIGH (ref 0.8–1.2)
Prothrombin Time: 19.9 seconds — ABNORMAL HIGH (ref 11.4–15.2)

## 2020-01-31 MED ORDER — METOPROLOL TARTRATE 25 MG PO TABS
25.0000 mg | ORAL_TABLET | Freq: Two times a day (BID) | ORAL | Status: DC
  Administered 2020-01-31: 25 mg via ORAL
  Filled 2020-01-31: qty 1

## 2020-01-31 MED ORDER — SODIUM CHLORIDE 0.9 % IV SOLN
3.0000 g | Freq: Three times a day (TID) | INTRAVENOUS | Status: DC
  Administered 2020-01-31 – 2020-02-02 (×5): 3 g via INTRAVENOUS
  Filled 2020-01-31 (×4): qty 3
  Filled 2020-01-31: qty 8
  Filled 2020-01-31: qty 3

## 2020-01-31 NOTE — Progress Notes (Signed)
Patient ID: Frank Daugherty, male   DOB: Apr 25, 1946, 74 y.o.   MRN: [id number]    Subjective: Pt without new complaints.  Objective: Vital signs in last 24 hours: Temp:  [98.1 F (36.7 C)-98.9 F (37.2 C)] 98.4 F (36.9 C) (02/21 0439) Pulse Rate:  [97-115] 100 (02/21 0439) Resp:  [17-22] 18 (02/21 0439) BP: (117-123)/(80-93) 117/80 (02/21 0439) SpO2:  [99 %-100 %] 99 % (02/21 0439) Weight:  [67.8 kg] 67.8 kg (02/21 0500)  Intake/Output from previous day: 02/20 0701 - 02/21 0700 In: 1257.2 [P.O.:360; I.V.:897.2] Out: 500 [Urine:500] Intake/Output this shift: No intake/output data recorded.  Physical Exam:  General: Alert and oriented GU: Urine clear  Lab Results: Recent Labs    01/29/20 0718 01/30/20 0800 01/31/20 0454  HGB 10.4* 8.5* 8.6*  HCT 32.4* 24.9* 25.8*   CBC Latest Ref Rng & Units 01/31/2020 01/30/2020 01/29/2020  WBC 4.0 - 10.5 K/uL 8.9 8.0 7.8  Hemoglobin 13.0 - 17.0 g/dL 8.6(L) 8.5(L) 10.4(L)  Hematocrit 39.0 - 52.0 % 25.8(L) 24.9(L) 32.4(L)  Platelets 150 - 400 K/uL 77(L) 84(L) 122(L)     BMET Recent Labs    01/30/20 0800 01/31/20 0454  NA 142 143  K 3.5 3.5  CL 115* 115*  CO2 16* 19*  GLUCOSE 118* 113*  BUN 38* 32*  CREATININE 1.66* 1.43*  CALCIUM 6.1* 6.8*     Studies/Results:   Assessment/Plan: 1) E coli bacteremia likely from GU source: Continue appropriate culture specific antibiotic therapy as recommended per ID once all cultures are final. 2) Metastatic prostate cancer: High volume metastatic prostate cancer.  Diagnosis is almost certain with biopsy of bone, rectal exam findings, and abnormal DRE.  Will not need further diagnostic evaluation.  Agree with ADT and abiraterone for initial treatment per Dr. Burr Medico. F/U with oncology for ongoing treatment. 3) Urinary retention/AKI: Likely related to locally advanced prostate cancer. Renal function improved.  Leave indwelling catheter for now and will consider outpatient voiding trial as  outpatient pending renal function stabilization and treatment of GU infection.  May require channel TURP to relieve bladder outlet obstruction eventually but this can be performed in a delayed fashion after stabilization of all medical issues and treatment for his metastatic disease has been started.  Will arrange outpatient follow up after hospitalization.  D/C with Foley in place.  Please call if further questions.   LOS: 3 days   Dutch Gray 01/31/2020, 8:07 AM

## 2020-01-31 NOTE — Progress Notes (Signed)
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  In the ED, temperature one 1.5, HR 111, BP 82/68, RR 21, SPO2 94% on room air.  Creatinine 2.42 (was 0.8 on 01/13/2020), calcium 5.5 corrected for albumin of 1.827.3.  Alkaline phosphatase 870, AST 235, ALT 132, total bilirubin 1.4, WBC count 11, hemoglobin 6.7, platelets 135, INR 2.1, urinalysis with large leukoesterase, many bacteria, negative nitrite, greater than 50 WBCs.  Chest x-ray with no acute cardiopulmonary disease process.  Patient was given 2.5 L NS bolus, started on cefepime and vancomycin and 1 unit PRBC ordered for transfusion.  Patient referred for admission for septic shock secondary to urinary source.   Assessment & Plan:   Principal Problem:   Severe sepsis with septic shock (HCC) Active Problems:   Coagulopathy (HCC)   Acute lower UTI   Elevated lactic acid level   Acute on chronic anemia   Acute metabolic encephalopathy   AKI (acute kidney injury) ([place])   Hypocalcemia   Transaminitis   Septic shock; present on admission E. coli, Enterococcus septicemia Lactic acidosis Patient presenting from SNF with 2-day history of worsening mental status, fever, and weakness.  Patient was febrile up to 1-1.5 with a blood pressure of 73/36 on admission.  Elevated lactic acid of 5.8 with underlying AKI and likely shock liver.  Blood cultures positive for Enterococcus and E. coli.  Received 2.5 L NS bolus in ED. Foley catheter exchanged --Infectious disease following, appreciate assistance --WBC 11.0-->7.8-->8.0-->8.9 --Lactic acid  5.8-->3.5-->1.3 --Vancomycin/cefepime changed to Zosyn per ID on 2/19 --Awaiting susceptibilities of blood cultures from 2/18 --Repeat Blood cultures 01/30/2020: penidng --Continue supportive care, antiemetics, antipyretics --Continue close monitoring of blood pressure; 111/80 this morning --Follow CBC daily, repeat lactic acid in a.m.  Elevated LFTs likely secondary to shock liver Elevated bilirubin Patient was noted to have an elevated AST of 235 and ALT of 132 on presentation with a total bilirubin of 1.4.  Etiology likely secondary to shock liver with significant hypotension in setting of sepsis as above.  Acute hepatitis panel negative. --AST 235-->217-->129-->96 --ALT 132-->139-->107-->96 --Tbili 1.4-->3.2-->2.5-->3.1 --Continue to trend CMP daily --Continue IV antibiotics and supportive care as above  Acute renal failure Creatinine 2.42 on presentation. FeNA 0.1%, consistent with prerenal.  Also suspect possible some ATN secondary to septic shock with hypotension as above.  Complicated by chronic indwelling Foley catheter with obstructive uropathy.  Foley catheter exchanged on 01/29/2020. --Cr 2.42-->2.14-->1.66-->1.43 --Avoid nephrotoxins, renally dose all medications --Continue IV fluids with NS at 75 mL's per hour --Follow renal function daily  Paroxysmal atrial fibrillation CHA2DS2-VASc score of 2.  Given history of coagulopathy, risks versus benefits discussions were performed during previous hospitalization with decision to refrain from anticoagulation.  On Toprol-XL 100 mg p.o. daily at home. --Currently in NSR, continue monitor on telemetry --metoprolol tartrate 25 mg p.o. twice daily with holding parameters  Acute on chronic anemia Baseline hemoglobin 8-10.  Etiology likely secondary to multifactorial with hypoproliferative in the setting of metastatic disease to the bone in addition to hepatic insufficiency.  Hemoglobin on presentation 6.7, most recent prior value of 8.5  on 01/14/2020.  No obvious signs of blood  loss/bleeding.  Iron level 22, TIBC 166, ferritin greater than 7500. --s/p 1u pRBC on 2/18 --Hgb 6.7-->10.4-->8.5-->8.6 --continue to monitor hemoglobin closely, transfuse for hemoglobin <7.0 or active bleeding  Coagulopathy INR elevated 2.2, likely secondary to suspected decreased hepatic synthetic function due to metastatic disease.  No signs of active bleeding. --INR 2.2-->1.8-->1.7 --Continue to monitor INR and CBC daily  Hypocalcemia Calcium 6.8 with albumin of 1.4, corrected for hypoalbuminemia, Ca = 8.9.  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 Burr Medico.  PSA 1,101.  MR pelvis with IV contrast notable for enhancing tumor mid to apex prostate with invasion of bladder wall consistent with likely prostate primary.  CT chest with no obvious lung mass but with bilateral pleural effusions and diffuse sclerotic osseous metastasis.  --Medical oncology, urology, palliative care following, appreciate assistance --Started on Casodex and Lupron --Plan to discharge on abiraterone after discharge --continue foley in place per urology with plan possible channel TURP to relieve bladder outlet obstruction outpatient  Weakness, debility: --PT/OT recommend return to SNF on discharge; continue therapy efforts while inpatient.  Adult failure to thrive Severe protein calorie malnutrition Body mass index is 20.85 kg/m. 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   Consultants:   Medical oncology - Dr. Burr Medico  Infectious disease - Dr. Johnnye Sima  Urology - Dr. Alinda Money  Palliative care  Procedures:   None  Antimicrobials:   Vancomycin 2/18 - 2/19  Cefepime 2/18 - 2/19  Zosyn 2/19>>   Subjective: Patient seen and examined bedside, lying in bed. Poor appetitie, but drinking fluids ok.  No other specific complaints this morning, pain controlled.  Denies headache, no chest pain, no shortness of breath, no abdominal pain.  No acute events overnight per nursing staff.  Objective: Vitals:   01/30/20 2011 01/31/20 0439 01/31/20 0500 01/31/20 0830  BP: (!) 122/93 117/80  121/85  Pulse: (!) 109 100  (!) 103  Resp: 18 18    Temp: 98.9 F (37.2 C) 98.4 F (36.9 C)  98.8 F (37.1 C)  TempSrc: Oral Oral  Oral  SpO2: 100% 99%  99%  Weight:   67.8 kg   Height:        Intake/Output Summary (Last 24 hours) at 01/31/2020 0904 Last data filed at 01/31/2020 0839 Gross per 24 hour  Intake 1257.15 ml  Output 725 ml  Net 532.15 ml   Filed Weights   01/28/20 2211 01/29/20 0000 01/31/20 0500  Weight: 71.7 kg 70.4 kg 67.8 kg    Examination:  General exam: Appears calm and comfortable, thin/cachectic in appearance Respiratory system: Clear to auscultation. Respiratory effort normal. Cardiovascular system: S1 & S2 heard, RRR. No JVD, murmurs, rubs, gallops or clicks. No pedal edema. Gastrointestinal system: Abdomen is nondistended, soft and nontender. No organomegaly or masses felt. Normal bowel sounds heard. GU: Foley catheter noted in place draining clear yellow urine Central nervous system: Alert and oriented. No focal neurological deficits. Extremities: Symmetric 5 x 5  power. Skin: No rashes, lesions or  ulcers Psychiatry: Judgement and insight appear poor. Mood & affect appropriate.     Data Reviewed: I have personally reviewed following labs and imaging studies  CBC: Recent Labs  Lab 01/28/20 1716 01/29/20 0718 01/30/20 0800 01/31/20 0454  WBC 11.0* 7.8 8.0 8.9  NEUTROABS 8.4* 6.0  --   --   HGB 6.7* 10.4* 8.5* 8.6*  HCT 21.0* 32.4* 24.9* 25.8*  MCV 84.3 84.8 81.9 83.0  PLT 135* 122* 84* 77*   Basic Metabolic Panel: Recent Labs  Lab 01/28/20 1716 01/28/20 1738 01/29/20 0718 01/30/20 0800 01/31/20 0454  NA 138  --  140 142 143  K 4.4  --  4.3 3.5 3.5  CL 108  --  110 115* 115*  CO2 15*  --  14* 16* 19*  GLUCOSE 225*  --  101* 118* 113*  BUN 37*  --  41* 38* 32*  CREATININE 2.42*  --  2.14* 1.66* 1.43*  CALCIUM 5.5*  --  6.0* 6.1* 6.8*  MG  --  1.9 1.9 1.8 2.0   GFR: Estimated Creatinine Clearance: 43.5 mL/min (A) (by C-G formula based on SCr of 1.43 mg/dL (H)). Liver Function Tests: Recent Labs  Lab 01/28/20 1716 01/29/20 0718 01/30/20 0800 01/31/20 0454  AST 235* 217* 129* 96*  ALT 132* 139* 107* 96*  ALKPHOS 870* 979* 763* 752*  BILITOT 1.4* 3.2* 2.5* 3.1*  PROT 5.9* 6.5 5.1* 5.0*  ALBUMIN 1.8* 2.1* 1.5* 1.4*   No results for input(s): LIPASE, AMYLASE in the last 168 hours. Recent Labs  Lab 01/29/20 0100  AMMONIA 15   Coagulation Profile: Recent Labs  Lab 01/28/20 1716 01/29/20 0100 01/30/20 0800 01/31/20 0454  INR 2.1* 2.2* 1.8* 1.7*   Cardiac Enzymes: No results for input(s): CKTOTAL, CKMB, CKMBINDEX, TROPONINI in the last 168 hours. BNP (last 3 results) No results for input(s): PROBNP in the last 8760 hours. HbA1C: No results for input(s): HGBA1C in the last 72 hours. CBG: Recent Labs  Lab 01/29/20 2344  GLUCAP 123*   Lipid Profile: No results for input(s): CHOL, HDL, LDLCALC, TRIG, CHOLHDL, LDLDIRECT in the last 72 hours. Thyroid Function Tests: Recent Labs    01/29/20 0100  TSH 1.978   Anemia Panel: Recent Labs     01/28/20 1716 01/28/20 2200  FERRITIN  --  >7,500*  TIBC  --  166*  IRON  --  22*  RETICCTPCT 2.1  --    Sepsis Labs: Recent Labs  Lab 01/28/20 1716 01/28/20 1941 01/29/20 0100 01/30/20 0751  LATICACIDVEN 5.8* 5.8* 3.5* 1.3    Recent Results (from the past 240 hour(s))  Blood Culture (routine x 2)     Status: Abnormal (Preliminary result)   Collection Time: 01/28/20  5:08 PM   Specimen: BLOOD  Result Value Ref Range Status   Specimen Description   Final    BLOOD RIGHT ANTECUBITAL Performed at [facility], [place] [address], [place], [place] [address]    Special Requests   Final    BOTTLES DRAWN AEROBIC AND ANAEROBIC Blood Culture adequate volume Performed at [place] [address], [place], [place] [address]    Culture  Setup Time   Final    GRAM NEGATIVE RODS IN BOTH AEROBIC AND ANAEROBIC BOTTLES CRITICAL RESULT CALLED TO, READ BACK BY AND VERIFIED WITH: PHARMD MARY @0829  01/29/20 AKT    Culture (A)  Final    ESCHERICHIA COLI SUSCEPTIBILITIES PERFORMED ON PREVIOUS CULTURE  WITHIN THE LAST 5 DAYS. Performed at [facility], [place] [address] [place], [place] [address]    Report Status PENDING  Incomplete  Blood Culture ID Panel (Reflexed)     Status: Abnormal   Collection Time: 01/28/20  5:08 PM  Result Value Ref Range Status   Enterococcus species DETECTED (A) NOT DETECTED Corrected    Comment: CRITICAL RESULT CALLED TO, READ BACK BY AND VERIFIED WITH: PHARMD MARY @0829  01/29/20 AKT CORRECTED ON 02/19 AT 0936: PREVIOUSLY REPORTED AS CRITICAL RESULT CALLED TO, READ BACK BY AND VERIFIED WITH: PHARMD MARY @0829  01/29/20 AKT    Vancomycin resistance NOT DETECTED NOT DETECTED Final   Listeria monocytogenes NOT DETECTED NOT DETECTED Final   Staphylococcus species NOT DETECTED NOT DETECTED Final   Staphylococcus aureus (BCID) NOT DETECTED NOT DETECTED Final   Streptococcus species NOT DETECTED NOT DETECTED Final    Streptococcus agalactiae NOT DETECTED NOT DETECTED Final   Streptococcus pneumoniae NOT DETECTED NOT DETECTED Final   Streptococcus pyogenes NOT DETECTED NOT DETECTED Final   Acinetobacter baumannii NOT DETECTED NOT DETECTED Final   Enterobacteriaceae species DETECTED (A) NOT DETECTED Final    Comment: Enterobacteriaceae represent a large family of gram-negative bacteria, not a single organism. CRITICAL RESULT CALLED TO, READ BACK BY AND VERIFIED WITH: PHARMD MARY @0829  01/29/20 AKT    Enterobacter cloacae complex NOT DETECTED NOT DETECTED Final   Escherichia coli DETECTED (A) NOT DETECTED Final    Comment: CRITICAL RESULT CALLED TO, READ BACK BY AND VERIFIED WITH: PHARMD MARY @0829  01/29/20 AKT    Klebsiella oxytoca NOT DETECTED NOT DETECTED Final   Klebsiella pneumoniae NOT DETECTED NOT DETECTED Final   Proteus species NOT DETECTED NOT DETECTED Final   Serratia marcescens NOT DETECTED NOT DETECTED Final   Carbapenem resistance NOT DETECTED NOT DETECTED Final   Haemophilus influenzae NOT DETECTED NOT DETECTED Final   Neisseria meningitidis NOT DETECTED NOT DETECTED Final   Pseudomonas aeruginosa NOT DETECTED NOT DETECTED Final   Candida albicans NOT DETECTED NOT DETECTED Final   Candida glabrata NOT DETECTED NOT DETECTED Final   Candida krusei NOT DETECTED NOT DETECTED Final   Candida parapsilosis NOT DETECTED NOT DETECTED Final   Candida tropicalis NOT DETECTED NOT DETECTED Final    Comment: Performed at [facility], [place] [address] [place], [place] [address]  Blood Culture (routine x 2)     Status: Abnormal (Preliminary result)   Collection Time: 01/28/20  5:16 PM   Specimen: BLOOD LEFT HAND  Result Value Ref Range Status   Specimen Description   Final    BLOOD LEFT HAND Performed at [place] [address], [place], [place] [address]    Special Requests   Final    BOTTLES DRAWN AEROBIC AND ANAEROBIC Blood Culture adequate volume Performed at  [place] [address], [place], [place] [address]    Culture  Setup Time   Final    GRAM NEGATIVE RODS IN BOTH AEROBIC AND ANAEROBIC BOTTLES CRITICAL VALUE NOTED.  VALUE IS CONSISTENT WITH PREVIOUSLY REPORTED AND CALLED VALUE. Performed at [facility], [place] [address], [place], [place] [address]    Culture ESCHERICHIA COLI (A)  Final   Report Status PENDING  Incomplete   Organism ID, Bacteria ESCHERICHIA COLI  Final      Susceptibility   Escherichia coli - MIC*    AMPICILLIN 16 INTERMEDIATE Intermediate     CEFAZOLIN <=4 SENSITIVE Sensitive     CEFEPIME <=  0.12 SENSITIVE Sensitive     CEFTAZIDIME <=1 SENSITIVE Sensitive     CEFTRIAXONE <=0.25 SENSITIVE Sensitive     CIPROFLOXACIN <=0.25 SENSITIVE Sensitive     GENTAMICIN <=1 SENSITIVE Sensitive     IMIPENEM <=0.25 SENSITIVE Sensitive     TRIMETH/SULFA <=20 SENSITIVE Sensitive     AMPICILLIN/SULBACTAM <=2 SENSITIVE Sensitive     PIP/TAZO <=4 SENSITIVE Sensitive     * ESCHERICHIA COLI  Urine culture     Status: Abnormal   Collection Time: 01/28/20  5:54 PM   Specimen: In/Out Cath Urine  Result Value Ref Range Status   Specimen Description   Final    IN/OUT CATH URINE Performed at [facility] [address], [place], [place] [address]    Special Requests   Final    NONE Performed at [facility], [place] [address], [place], [place] [address]    Culture MULTIPLE SPECIES PRESENT, SUGGEST RECOLLECTION (A)  Final   Report Status 01/29/2020 FINAL  Final  SARS CORONAVIRUS 2 (TAT 6-24 HRS) Nasopharyngeal Nasopharyngeal Swab     Status: None   Collection Time: 01/28/20  8:54 PM   Specimen: Nasopharyngeal Swab  Result Value Ref Range Status   SARS Coronavirus 2 NEGATIVE NEGATIVE Final    Comment: (NOTE) SARS-CoV-2 target nucleic acids are NOT DETECTED. The SARS-CoV-2 RNA is generally detectable in upper and lower respiratory specimens during the acute phase of  infection. Negative results do not preclude SARS-CoV-2 infection, do not rule out co-infections with other pathogens, and should not be used as the sole basis for treatment or other patient management decisions. Negative results must be combined with clinical observations, patient history, and epidemiological information. The expected result is Negative. Fact Sheet for Patients: SugarRoll.be Fact Sheet for Healthcare Providers: https://www.woods-mathews.com/ This test is not yet approved or cleared by the [place] FDA and  has been authorized for detection and/or diagnosis of SARS-CoV-2 by FDA under an Emergency Use Authorization (EUA). This EUA will remain  in effect (meaning this test can be used) for the duration of the COVID-19 declaration under Section 56 4(b)(1) of the Act, 21 U.S.C. section 360bbb-3(b)(1), unless the authorization is terminated or revoked sooner. Performed at [facility], [place] [address] [place], [place] [address]   MRSA PCR Screening     Status: None   Collection Time: 01/28/20 11:55 PM   Specimen: Nasal Mucosa; Nasopharyngeal  Result Value Ref Range Status   MRSA by PCR NEGATIVE NEGATIVE Final    Comment:        The GeneXpert MRSA Assay (FDA approved for NASAL specimens only), is one component of a comprehensive MRSA colonization surveillance program. It is not intended to diagnose MRSA infection nor to guide or monitor treatment for MRSA infections. Performed at [facility], [place] [address], [place], [place] [address]          Radiology Studies: CT CHEST WO CONTRAST  Result Date: 01/29/2020 CLINICAL DATA:  Elevated alkaline phosphatase. 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 Unknown primary. EXAM: CT CHEST WITHOUT CONTRAST TECHNIQUE: Multidetector CT imaging of the chest was performed following the standard protocol without IV contrast. COMPARISON:  Chest radiograph  01/28/2020.  Abdominal MRI 01/06/2020. FINDINGS: Cardiovascular: Mild to moderate motion degradation throughout. Aortic atherosclerosis. Tortuous thoracic aorta. Mild cardiomegaly, without pericardial effusion. Multivessel coronary artery atherosclerosis. Mediastinum/Nodes: No mediastinal or definite hilar adenopathy, given limitations of unenhanced CT. Tiny hiatal hernia. Lungs/Pleura: Small bilateral pleural effusions. Right minor fissure thickening  on 60/7. Bibasilar atelectasis. Areas of smooth septal thickening are mild and most apparent at the lung apices. No dominant pulmonary nodule or mass. Upper Abdomen: Low-density liver lesions or characterized as cysts on prior dedicated MRI. Motion degradation containing in the upper abdomen. Incompletely imaged mildly distended gallbladder. Normal imaged portions of the stomach pancreas, adrenal glands. Musculoskeletal: Mild left gynecomastia. Anasarca. Diffuse sclerotic osseous metastasis. No vertebral body height loss. IMPRESSION: 1. Mild to moderate motion degradation.Given this limitation, no dominant lung mass. 2. Bilateral pleural effusions with mild septal thickening at the apices. Question mild fluid overload/congestive heart failure. 3. Diffuse sclerotic osseous metastasis. This consistent with prostate primary, given PSA level of greater than 1,100 earlier today. 4. Gallbladder distension, incompletely imaged. Electronically Signed   By: Abigail Miyamoto M.D.   On: 01/29/2020 15:59   MR PELVIS W WO CONTRAST  Result Date: 01/29/2020 CLINICAL DATA:  Osseous metastatic disease. Elevated alkaline phosphatase. EXAM: MRI PELVIS WITHOUT AND WITH CONTRAST TECHNIQUE: Multiplanar multisequence MR imaging of the pelvis was performed both before and after administration of intravenous contrast. CONTRAST:  53mL GADAVIST GADOBUTROL 1 MMOL/ML IV SOLN COMPARISON:  Abdominal MRI 01/06/2020. FINDINGS: Bones/Joint/Cartilage There is widespread heterogeneous marrow replacement  throughout the pelvis, the visualized bilateral proximal femora, and lower lumbar spine. Heterogeneous enhancement throughout the marrow structures. No pathologic fracture is identified. No enhancing extraosseous soft tissue mass is seen. Trace bilateral hip joint effusions, nonspecific. 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 Pubic symphysis and SI joints are intact. Ligaments Grossly intact. Muscles and Tendons Mild diffuse intramuscular edema throughout the pelvic and visualized thigh musculature bilaterally. Tendinous structures about the pelvis appear grossly intact. Soft tissues Enlarged, heterogeneous prostate gland measuring 3.8 x 5.3 x 4.4 cm. Enhancing tumor with the mid gland to prostate apex on the left with restricted diffusion (series 12, images 21-22). Findings concerning for invasion of the outer surface of the adjacent urinary bladder (series 13, image 24; series 12, image 20). There is also a adjacent abnormal left internal iliac chain pelvic lymph node (series 13, image 17), likely a site of metastatic disease. Foley catheter is present within the urinary bladder. Trace free fluid within the pelvis. Mild diffuse soft tissue/body wall edema. No soft tissue fluid collections. IMPRESSION: 1. Enhancing tumor with the mid gland to prostate apex on the left with restricted diffusion suggesting high grade tumor. Findings concerning for invasion of the outer surface of the adjacent urinary bladder wall. There is also a adjacent abnormal left internal iliac chain pelvic lymph node, likely a site of metastatic disease. 2. Diffuse heterogeneous marrow replacement throughout the pelvis, visualized bilateral proximal femora, and lower lumbar spine, consistent with widespread osseous metastatic disease. No pathologic fracture. 3. Mild diffuse anasarca and small amount of free fluid within the pelvis. These results will be called to the ordering clinician or representative by the Radiologist  Assistant, and communication documented in the PACS or zVision Dashboard. Electronically Signed   By: Davina Poke D.O.   On: 01/29/2020 16:10        Scheduled Meds: . bicalutamide  50 mg Oral Daily  . Chlorhexidine Gluconate Cloth  6 each Topical Daily  . feeding supplement  1 Container Oral BID BM  . feeding supplement (ENSURE ENLIVE)  237 mL Oral BID BM  . mouth rinse  15 mL Mouth Rinse BID  . metoprolol tartrate  12.5 mg Oral BID  . multivitamin with minerals  1 tablet Oral Daily  . sodium chloride flush  3 mL Intravenous Q12H   Continuous Infusions: . sodium chloride 75 mL/hr at 01/31/20 0256  . piperacillin-tazobactam (ZOSYN)  IV 3.375 g (01/31/20 0431)     LOS: 3 days    Time spent: 39 minutes spent on chart review, discussion with nursing staff, consultants, updating family and interview/physical exam; more than 50% of that time was spent in counseling and/or coordination of care.    Martisha Toulouse J [place] DO [facility] Available via Epic secure chat 7am-7pm After these hours, please refer to coverage provider listed on [email or web address] 01/31/2020, 9:04 AM

## 2020-01-31 NOTE — Progress Notes (Signed)
      INFECTIOUS DISEASE ATTENDING ADDENDUM:   Date: 01/31/2020  Patient name: Frank Daugherty  Medical record number: [id number]  Date of birth: [birth year]   Patients E coli is S to AMP/SULB  Will narrow to Unasyn  Dr. Johnnye Sima is back tomorrow.   Alcide Evener 01/31/2020, 3:35 PM

## 2020-01-31 NOTE — Progress Notes (Signed)
Pharmacy Antibiotic Note  Frank Daugherty is a 74 y.o. male admitted on 01/28/2020 with UTI.  Pharmacy has been consulted for cefepime dosing.  Received Vancomycin x1 2/18  2/19: BCID resulted 4/4 bottles with E coli, also Enterococcus Tmax 101.5, WBC 11 > 7.8, SCr 2.42 > 2.14 ( SCr 01/13/2020 was 0.8)  2/21:  E.coli sensitive to Ampicillin/Sulbactam.  Zosyn d/c'ed and pharmacy consulted to dose Unasyn.  Plan: d/c Zosyn Unasyn 3gm IV q8h  Height: 5\' 11"  (180.3 cm) Weight: 149 lb 7.6 oz (67.8 kg) IBW/kg (Calculated) : 75.3  Temp (24hrs), Avg:98.8 F (37.1 C), Min:98.4 F (36.9 C), Max:98.9 F (37.2 C)  Recent Labs  Lab 01/28/20 1716 01/28/20 1941 01/29/20 0100 01/29/20 0718 01/30/20 0751 01/30/20 0800 01/31/20 0454  WBC 11.0*  --   --  7.8  --  8.0 8.9  CREATININE 2.42*  --   --  2.14*  --  1.66* 1.43*  LATICACIDVEN 5.8* 5.8* 3.5*  --  1.3  --   --     Estimated Creatinine Clearance: 43.5 mL/min (A) (by C-G formula based on SCr of 1.43 mg/dL (H)).    No Active Allergies  Antimicrobials this admission: 2/18 cefepime >> 2/19 2/18 vancomycin >> 2/19 2/19 Zosyn >> 2/21 2/21 Unasyn >>  Dose adjustments this admission: 2/19 Cefepime 2gm q24 >> q12 with improving renal fx  Microbiology results: 2/18 MRSA PCR: neg 2/18 UCx: sent 2/18 BCx: 4/4 E coli, Enterococcus (no Vanc resis) Thank you for allowing pharmacy to be a part of this patient's care.  Everette Rank PharmD 01/31/2020 3:42 PM

## 2020-01-31 NOTE — Plan of Care (Signed)
  Problem: Education: Goal: Knowledge of General Education information will improve Description: Including pain rating scale, medication(s)/side effects and non-pharmacologic comfort measures Outcome: Progressing   Problem: Health Behavior/Discharge Planning: Goal: Ability to manage health-related needs will improve Outcome: Progressing   Problem: Clinical Measurements: Goal: Diagnostic test results will improve Outcome: Progressing Goal: Respiratory complications will improve Outcome: Progressing Goal: Cardiovascular complication will be avoided Outcome: Progressing   Problem: Safety: Goal: Ability to remain free from injury will improve Outcome: Progressing

## 2020-02-01 DIAGNOSIS — B9620 Unspecified Escherichia coli [E. coli] as the cause of diseases classified elsewhere: Secondary | ICD-10-CM

## 2020-02-01 DIAGNOSIS — Z515 Encounter for palliative care: Secondary | ICD-10-CM

## 2020-02-01 DIAGNOSIS — C7951 Secondary malignant neoplasm of bone: Secondary | ICD-10-CM

## 2020-02-01 DIAGNOSIS — C61 Malignant neoplasm of prostate: Secondary | ICD-10-CM

## 2020-02-01 DIAGNOSIS — R7881 Bacteremia: Secondary | ICD-10-CM

## 2020-02-01 DIAGNOSIS — R531 Weakness: Secondary | ICD-10-CM

## 2020-02-01 DIAGNOSIS — C799 Secondary malignant neoplasm of unspecified site: Secondary | ICD-10-CM

## 2020-02-01 DIAGNOSIS — Z960 Presence of urogenital implants: Secondary | ICD-10-CM

## 2020-02-01 DIAGNOSIS — Z7189 Other specified counseling: Secondary | ICD-10-CM

## 2020-02-01 LAB — PROTIME-INR
INR: 1.6 — ABNORMAL HIGH (ref 0.8–1.2)
Prothrombin Time: 18.5 seconds — ABNORMAL HIGH (ref 11.4–15.2)

## 2020-02-01 LAB — CULTURE, BLOOD (ROUTINE X 2)
Special Requests: ADEQUATE
Special Requests: ADEQUATE

## 2020-02-01 LAB — CBC
HCT: 26.1 % — ABNORMAL LOW (ref 39.0–52.0)
Hemoglobin: 8.6 g/dL — ABNORMAL LOW (ref 13.0–17.0)
MCH: 27.6 pg (ref 26.0–34.0)
MCHC: 33 g/dL (ref 30.0–36.0)
MCV: 83.7 fL (ref 80.0–100.0)
Platelets: 71 10*3/uL — ABNORMAL LOW (ref 150–400)
RBC: 3.12 MIL/uL — ABNORMAL LOW (ref 4.22–5.81)
RDW: 19.6 % — ABNORMAL HIGH (ref 11.5–15.5)
WBC: 10.9 10*3/uL — ABNORMAL HIGH (ref 4.0–10.5)
nRBC: 2.4 % — ABNORMAL HIGH (ref 0.0–0.2)

## 2020-02-01 LAB — COMPREHENSIVE METABOLIC PANEL
ALT: 89 U/L — ABNORMAL HIGH (ref 0–44)
AST: 85 U/L — ABNORMAL HIGH (ref 15–41)
Albumin: 1.4 g/dL — ABNORMAL LOW (ref 3.5–5.0)
Alkaline Phosphatase: 746 U/L — ABNORMAL HIGH (ref 38–126)
Anion gap: 9 (ref 5–15)
BUN: 28 mg/dL — ABNORMAL HIGH (ref 8–23)
CO2: 18 mmol/L — ABNORMAL LOW (ref 22–32)
Calcium: 7.2 mg/dL — ABNORMAL LOW (ref 8.9–10.3)
Chloride: 116 mmol/L — ABNORMAL HIGH (ref 98–111)
Creatinine, Ser: 1.36 mg/dL — ABNORMAL HIGH (ref 0.61–1.24)
GFR calc Af Amer: 59 mL/min — ABNORMAL LOW (ref >60)
GFR calc non Af Amer: 51 mL/min — ABNORMAL LOW (ref >60)
Glucose, Bld: 131 mg/dL — ABNORMAL HIGH (ref 70–99)
Potassium: 3.4 mmol/L — ABNORMAL LOW (ref 3.5–5.1)
Sodium: 143 mmol/L (ref 135–145)
Total Bilirubin: 2.9 mg/dL — ABNORMAL HIGH (ref 0.3–1.2)
Total Protein: 5.2 g/dL — ABNORMAL LOW (ref 6.5–8.1)

## 2020-02-01 LAB — MAGNESIUM: Magnesium: 2 mg/dL (ref 1.7–2.4)

## 2020-02-01 MED ORDER — METOPROLOL TARTRATE 50 MG PO TABS
50.0000 mg | ORAL_TABLET | Freq: Two times a day (BID) | ORAL | Status: DC
  Administered 2020-02-01 – 2020-02-03 (×4): 50 mg via ORAL
  Filled 2020-02-01 (×2): qty 1
  Filled 2020-02-01: qty 2
  Filled 2020-02-01 (×2): qty 1

## 2020-02-01 MED ORDER — POTASSIUM CHLORIDE CRYS ER 20 MEQ PO TBCR
30.0000 meq | EXTENDED_RELEASE_TABLET | ORAL | Status: AC
  Administered 2020-02-01 (×2): 30 meq via ORAL
  Filled 2020-02-01 (×2): qty 1

## 2020-02-01 NOTE — Progress Notes (Signed)
INFECTIOUS DISEASE PROGRESS NOTE  ID: Frank Daugherty is a 74 y.o. male with  Principal Problem:   Severe sepsis with septic shock ([place]) Active Problems:   Coagulopathy ([place])   Acute lower UTI   Elevated lactic acid level   Acute on chronic anemia   Acute metabolic encephalopathy   AKI (acute kidney injury) ([place])   Hypocalcemia   Transaminitis   Palliative care by specialist   Goals of care, counseling/discussion   General weakness  Subjective: Resting quietly.   Abtx:  Anti-infectives (From admission, onward)   Start     Dose/Rate Route Frequency Ordered Stop   01/31/20 2200  Ampicillin-Sulbactam (UNASYN) 3 g in sodium chloride 0.9 % 100 mL IVPB     3 g 200 mL/hr over 30 Minutes Intravenous Every 8 hours 01/31/20 1539     01/30/20 1800  vancomycin (VANCOREADY) IVPB 1500 mg/300 mL  Status:  Discontinued     1,500 mg 150 mL/hr over 120 Minutes Intravenous Every 48 hours 01/29/20 0849 01/29/20 1154   01/29/20 2000  piperacillin-tazobactam (ZOSYN) IVPB 3.375 g  Status:  Discontinued     3.375 g 12.5 mL/hr over 240 Minutes Intravenous Every 8 hours 01/29/20 1208 01/31/20 1539   01/29/20 1600  ceFEPIme (MAXIPIME) 2 g in sodium chloride 0.9 % 100 mL IVPB  Status:  Discontinued     2 g 200 mL/hr over 30 Minutes Intravenous Every 24 hours 01/29/20 0217 01/29/20 0916   01/29/20 1000  ceFEPIme (MAXIPIME) 2 g in sodium chloride 0.9 % 100 mL IVPB  Status:  Discontinued     2 g 200 mL/hr over 30 Minutes Intravenous Every 12 hours 01/29/20 0916 01/29/20 1154   01/28/20 1700  ceFEPIme (MAXIPIME) 2 g in sodium chloride 0.9 % 100 mL IVPB     2 g 200 mL/hr over 30 Minutes Intravenous  Once 01/28/20 1648 01/28/20 1849   01/28/20 1700  vancomycin (VANCOCIN) IVPB 1000 mg/200 mL premix  Status:  Discontinued     1,000 mg 200 mL/hr over 60 Minutes Intravenous  Once 01/28/20 1648 01/28/20 1651   01/28/20 1700  vancomycin (VANCOREADY) IVPB 1500 mg/300 mL     1,500 mg 150 mL/hr over 120  Minutes Intravenous  Once 01/28/20 1651 01/28/20 2024      Medications:  Scheduled: . bicalutamide  50 mg Oral Daily  . Chlorhexidine Gluconate Cloth  6 each Topical Daily  . feeding supplement  1 Container Oral BID BM  . feeding supplement (ENSURE ENLIVE)  237 mL Oral BID BM  . mouth rinse  15 mL Mouth Rinse BID  . metoprolol tartrate  50 mg Oral BID  . multivitamin with minerals  1 tablet Oral Daily  . sodium chloride flush  3 mL Intravenous Q12H    Objective: Vital signs in last 24 hours: Temp:  [99.3 F (37.4 C)-99.7 F (37.6 C)] 99.7 F (37.6 C) (02/22 1432) Pulse Rate:  [100-108] 100 (02/22 1432) Resp:  [18-25] 25 (02/22 1432) BP: (124-125)/(86-89) 124/86 (02/22 1432) SpO2:  [94 %-96 %] 96 % (02/22 1432) Weight:  [77 kg] 77 kg (02/22 0500)   General appearance: no distress Resp: clear to auscultation bilaterally Cardio: regular rate and rhythm GI: normal findings: bowel sounds normal and soft, non-tender  Lab Results Recent Labs    01/31/20 0454 02/01/20 0436  WBC 8.9 10.9*  HGB 8.6* 8.6*  HCT 25.8* 26.1*  NA 143 143  K 3.5 3.4*  CL 115* 116*  CO2 19* 18*  BUN 32* 28*  CREATININE 1.43* 1.36*   Liver Panel Recent Labs    01/31/20 0454 02/01/20 0436  PROT 5.0* 5.2*  ALBUMIN 1.4* 1.4*  AST 96* 85*  ALT 96* 89*  ALKPHOS 752* 746*  BILITOT 3.1* 2.9*   Sedimentation Rate No results for input(s): ESRSEDRATE in the last 72 hours. C-Reactive Protein No results for input(s): CRP in the last 72 hours.  Microbiology: Recent Results (from the past 240 hour(s))  Blood Culture (routine x 2)     Status: Abnormal   Collection Time: 01/28/20  5:08 PM   Specimen: BLOOD  Result Value Ref Range Status   Specimen Description   Final    BLOOD RIGHT ANTECUBITAL Performed at [facility] [address] [place], [place] [address]    Special Requests   Final    BOTTLES DRAWN AEROBIC AND ANAEROBIC Blood Culture adequate volume Performed at  [facility] [address], [place], [place] [address]    Culture  Setup Time   Final    GRAM NEGATIVE RODS IN BOTH AEROBIC AND ANAEROBIC BOTTLES CRITICAL RESULT CALLED TO, READ BACK BY AND VERIFIED WITH: PHARMD MARY @0829  01/29/20 AKT    Culture (A)  Final    ESCHERICHIA COLI SUSCEPTIBILITIES PERFORMED ON PREVIOUS CULTURE WITHIN THE LAST 5 DAYS. Performed at [facility], [place] [address] [place], [place] [address]    Report Status 02/01/2020 FINAL  Final  Blood Culture ID Panel (Reflexed)     Status: Abnormal   Collection Time: 01/28/20  5:08 PM  Result Value Ref Range Status   Enterococcus species DETECTED (A) NOT DETECTED Corrected    Comment: CRITICAL RESULT CALLED TO, READ BACK BY AND VERIFIED WITH: PHARMD MARY @0829  01/29/20 AKT CORRECTED ON 02/19 AT 0936: PREVIOUSLY REPORTED AS CRITICAL RESULT CALLED TO, READ BACK BY AND VERIFIED WITH: PHARMD MARY @0829  01/29/20 AKT    Vancomycin resistance NOT DETECTED NOT DETECTED Final   Listeria monocytogenes NOT DETECTED NOT DETECTED Final   Staphylococcus species NOT DETECTED NOT DETECTED Final   Staphylococcus aureus (BCID) NOT DETECTED NOT DETECTED Final   Streptococcus species NOT DETECTED NOT DETECTED Final   Streptococcus agalactiae NOT DETECTED NOT DETECTED Final   Streptococcus pneumoniae NOT DETECTED NOT DETECTED Final   Streptococcus pyogenes NOT DETECTED NOT DETECTED Final   Acinetobacter baumannii NOT DETECTED NOT DETECTED Final   Enterobacteriaceae species DETECTED (A) NOT DETECTED Final    Comment: Enterobacteriaceae represent a large family of gram-negative bacteria, not a single organism. CRITICAL RESULT CALLED TO, READ BACK BY AND VERIFIED WITH: PHARMD MARY @0829  01/29/20 AKT    Enterobacter cloacae complex NOT DETECTED NOT DETECTED Final   Escherichia coli DETECTED (A) NOT DETECTED Final    Comment: CRITICAL RESULT CALLED TO, READ BACK BY AND VERIFIED WITH: PHARMD MARY @0829  01/29/20 AKT     Klebsiella oxytoca NOT DETECTED NOT DETECTED Final   Klebsiella pneumoniae NOT DETECTED NOT DETECTED Final   Proteus species NOT DETECTED NOT DETECTED Final   Serratia marcescens NOT DETECTED NOT DETECTED Final   Carbapenem resistance NOT DETECTED NOT DETECTED Final   Haemophilus influenzae NOT DETECTED NOT DETECTED Final   Neisseria meningitidis NOT DETECTED NOT DETECTED Final   Pseudomonas aeruginosa NOT DETECTED NOT DETECTED Final   Candida albicans NOT DETECTED NOT DETECTED Final   Candida glabrata NOT DETECTED NOT DETECTED Final   Candida krusei NOT DETECTED NOT DETECTED Final   Candida parapsilosis NOT DETECTED NOT DETECTED Final  Candida tropicalis NOT DETECTED NOT DETECTED Final    Comment: Performed at [facility], [place] [address] [place], [place] [address]  Blood Culture (routine x 2)     Status: Abnormal   Collection Time: 01/28/20  5:16 PM   Specimen: BLOOD LEFT HAND  Result Value Ref Range Status   Specimen Description   Final    BLOOD LEFT HAND Performed at [facility] [address], [place], [place] [address]    Special Requests   Final    BOTTLES DRAWN AEROBIC AND ANAEROBIC Blood Culture adequate volume Performed at [facility] [address], [place], [place] [address]    Culture  Setup Time   Final    GRAM NEGATIVE RODS IN BOTH AEROBIC AND ANAEROBIC BOTTLES CRITICAL VALUE NOTED.  VALUE IS CONSISTENT WITH PREVIOUSLY REPORTED AND CALLED VALUE. Performed at [facility], [place] [address] [place], [place] [address]    Culture ESCHERICHIA COLI (A)  Final   Report Status 02/01/2020 FINAL  Final   Organism ID, Bacteria ESCHERICHIA COLI  Final      Susceptibility   Escherichia coli - MIC*    AMPICILLIN 16 INTERMEDIATE Intermediate     CEFAZOLIN <=4 SENSITIVE Sensitive     CEFEPIME <=0.12 SENSITIVE Sensitive     CEFTAZIDIME <=1 SENSITIVE Sensitive     CEFTRIAXONE <=0.25 SENSITIVE Sensitive     CIPROFLOXACIN  <=0.25 SENSITIVE Sensitive     GENTAMICIN <=1 SENSITIVE Sensitive     IMIPENEM <=0.25 SENSITIVE Sensitive     TRIMETH/SULFA <=20 SENSITIVE Sensitive     AMPICILLIN/SULBACTAM <=2 SENSITIVE Sensitive     PIP/TAZO <=4 SENSITIVE Sensitive     * ESCHERICHIA COLI  Urine culture     Status: Abnormal   Collection Time: 01/28/20  5:54 PM   Specimen: In/Out Cath Urine  Result Value Ref Range Status   Specimen Description   Final    IN/OUT CATH URINE Performed at [facility], [place] [address] [place], [place] [address]    Special Requests   Final    NONE Performed at [facility], [place] [address], [place], [place] [address]    Culture MULTIPLE SPECIES PRESENT, SUGGEST RECOLLECTION (A)  Final   Report Status 01/29/2020 FINAL  Final  SARS CORONAVIRUS 2 (TAT 6-24 HRS) Nasopharyngeal Nasopharyngeal Swab     Status: None   Collection Time: 01/28/20  8:54 PM   Specimen: Nasopharyngeal Swab  Result Value Ref Range Status   SARS Coronavirus 2 NEGATIVE NEGATIVE Final    Comment: (NOTE) SARS-CoV-2 target nucleic acids are NOT DETECTED. The SARS-CoV-2 RNA is generally detectable in upper and lower respiratory specimens during the acute phase of infection. Negative results do not preclude SARS-CoV-2 infection, do not rule out co-infections with other pathogens, and should not be used as the sole basis for treatment or other patient management decisions. Negative results must be combined with clinical observations, patient history, and epidemiological information. The expected result is Negative. Fact Sheet for Patients: SugarRoll.be Fact Sheet for Healthcare Providers: https://www.woods-mathews.com/ This test is not yet approved or cleared by the [place] FDA and  has been authorized for detection and/or diagnosis of SARS-CoV-2 by FDA under an Emergency Use Authorization (EUA). This EUA will remain  in effect  (meaning this test can be used) for the duration of the COVID-19 declaration under Section 56 4(b)(1) of the Act, 21 U.S.C. section 360bbb-3(b)(1), unless the authorization is terminated or revoked  sooner. Performed at [facility], [place] [address] [place], [place] [address]   MRSA PCR Screening     Status: None   Collection Time: 01/28/20 11:55 PM   Specimen: Nasal Mucosa; Nasopharyngeal  Result Value Ref Range Status   MRSA by PCR NEGATIVE NEGATIVE Final    Comment:        The GeneXpert MRSA Assay (FDA approved for NASAL specimens only), is one component of a comprehensive MRSA colonization surveillance program. It is not intended to diagnose MRSA infection nor to guide or monitor treatment for MRSA infections. Performed at [facility], [place] [address], [place], [place] [address]   Culture, blood (Routine X 2) w Reflex to ID Panel     Status: None (Preliminary result)   Collection Time: 01/30/20  8:00 AM   Specimen: BLOOD  Result Value Ref Range Status   Specimen Description   Final    BLOOD RIGHT ARM Performed at [facility] [address], [place],  [address]    Special Requests   Final    BOTTLES DRAWN AEROBIC AND ANAEROBIC Blood Culture adequate volume Performed at [facility] [address] [place],  [address]    Culture   Final    NO GROWTH 2 DAYS Performed at [facility] [address], [place],  [address]    Report Status PENDING  Incomplete    Studies/Results: No results found.   Assessment/Plan: Enterococcal bacteremia E coli bacteremia (4/4 on BCx) Metastatic CA, Prostate Aki  Total days of antibiotics: 4 (unasyn)       Cr improving.  Repeat BCx 2-20 pending He appears to be doing well Foley in place due to prostate size Palliative care has seen him. My great appreciation to them    Bobby Rumpf MD, FACP Infectious Diseases (pager) [phone] [email or web address] 02/01/2020, 2:50 PM  LOS: 4 days

## 2020-02-01 NOTE — Progress Notes (Signed)
Pt encouraged to eat some lunch. Pt ate a small amount of meatloaf and macaroni and cheese. Pt did drink a peach boost and a cranberry juice. Continue to encourage po intake

## 2020-02-01 NOTE — Progress Notes (Signed)
Pt with encouragement for breakfast ate all of his oatmeal and one container of applesauce and drank one cranberry juice

## 2020-02-01 NOTE — Progress Notes (Signed)
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  In the ED, temperature one 1.5, HR 111, BP 82/68, RR 21, SPO2 94% on room air.  Creatinine 2.42 (was 0.8 on 01/13/2020), calcium 5.5 corrected for albumin of 1.827.3.  Alkaline phosphatase 870, AST 235, ALT 132, total bilirubin 1.4, WBC count 11, hemoglobin 6.7, platelets 135, INR 2.1, urinalysis with large leukoesterase, many bacteria, negative nitrite, greater than 50 WBCs.  Chest x-ray with no acute cardiopulmonary disease process.  Patient was given 2.5 L NS bolus, started on cefepime and vancomycin and 1 unit PRBC ordered for transfusion.  Patient referred for admission for septic shock secondary to urinary source.   Assessment & Plan:   Principal Problem:   Severe sepsis with septic shock (HCC) Active Problems:   Coagulopathy (HCC)   Acute lower UTI   Elevated lactic acid level   Acute on chronic anemia   Acute metabolic encephalopathy   AKI (acute kidney injury) ([place])   Hypocalcemia   Transaminitis   Septic shock; present on admission E. coli, Enterococcus septicemia Lactic acidosis Patient presenting from SNF with 2-day history of worsening mental status, fever, and weakness.  Patient was febrile up to 1-1.5 with a blood pressure of 73/36 on admission.  Elevated lactic acid of 5.8 with underlying AKI and likely shock liver.  Blood cultures positive for Enterococcus and E. coli.  Received 2.5 L NS bolus in ED. Foley catheter exchanged --Infectious disease following, appreciate assistance --WBC 11.0-->7.8-->8.0-->8.9-->10.9 --Lactic acid  5.8-->3.5-->1.3 --Vancomycin/cefepime-->Zosyn 2/19-->Unasyn 2/21 --Repeat Blood cultures 01/30/2020: no growth x 1 day --Continue supportive care, antiemetics, antipyretics --Continue close monitoring of blood pressure; 124/89 this morning --Follow CBC daily, repeat lactic acid in a.m.  Elevated LFTs likely secondary to shock liver Elevated bilirubin Patient was noted to have an elevated AST of 235 and ALT of 132 on presentation with a total bilirubin of 1.4.  Etiology likely secondary to shock liver with significant hypotension in setting of sepsis as above.  Acute hepatitis panel negative. --AST 235-->217-->129-->96-->85 --ALT 132-->139-->107-->96-->89 --Tbili 1.4-->3.2-->2.5-->3.1-->2.9 --Continue to trend CMP daily --Continue IV antibiotics and supportive care as above  Acute renal failure Creatinine 2.42 on presentation. FeNA 0.1%, consistent with prerenal.  Also suspect possible some ATN secondary to septic shock with hypotension as above.  Complicated by chronic indwelling Foley catheter with obstructive uropathy.  Foley catheter exchanged on 01/29/2020. --Cr 2.42-->2.14-->1.66-->1.43-->1.36 --Avoid nephrotoxins, renally dose all medications --Follow renal function daily  Paroxysmal atrial fibrillation CHA2DS2-VASc score of 2.  Given history of coagulopathy, risks versus benefits discussions were performed during previous hospitalization with decision to refrain from anticoagulation.  On Toprol-XL 100 mg p.o. daily at home. --Currently in NSR, continue monitor on telemetry --increase metoprolol tartrate to 50 mg p.o. BID with holding parameters  Acute on chronic anemia Baseline hemoglobin 8-10.  Etiology likely secondary to multifactorial with hypoproliferative in the setting of metastatic disease to the bone in addition to hepatic insufficiency.  Hemoglobin on presentation 6.7, most recent prior value of 8.5 on 01/14/2020.  No obvious signs of blood loss/bleeding.  Iron level 22, TIBC  166, ferritin greater than 7500.  Etiology likely secondary to malignancy  with bony metastasis with likely marrow infiltration. --s/p 1u pRBC on 2/18 --Hgb 6.7-->10.4-->8.5-->8.6-->8.6 --continue to monitor hemoglobin closely, transfuse for hemoglobin <8.0 or active bleeding  Coagulopathy INR elevated 2.2, likely secondary to suspected decreased hepatic synthetic function due to metastatic disease.  No signs of active bleeding. --INR 2.2-->1.8-->1.7-->1.6 --Continue to monitor INR and CBC daily  Hypocalcemia Calcium 7.2 with albumin of 1.4, corrected for hypoalbuminemia, Ca = 9.3.  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 Burr Daugherty.  PSA 1,101.  MR pelvis with IV contrast notable for enhancing tumor mid to apex prostate with invasion of bladder wall consistent with likely prostate primary.  CT chest with no obvious lung mass but with bilateral pleural effusions and diffuse sclerotic osseous metastasis.  --Medical oncology, urology, palliative care following, appreciate assistance --Started on Casodex and Lupron --awaiting for abiraterone from pharmacy per oncology --continue foley in place per urology with plan possible channel TURP to relieve bladder outlet obstruction outpatient  Weakness, debility: --PT/OT recommend return to SNF on discharge; continue therapy efforts while inpatient.  Adult failure to thrive Severe protein calorie malnutrition Body mass index is 23.68 kg/m. 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   Consultants:   Medical oncology - Dr. Burr Daugherty  Infectious disease - Dr. Johnnye Sima  Urology - Dr. Alinda Money  Palliative care  Procedures:   None  Antimicrobials:   Vancomycin 2/18 - 2/19  Cefepime 2/18 - 2/19  Zosyn 2/19>>   Subjective: Patient seen and examined bedside, lying in bed.  No specific complaints this morning.  Continues with poor appetite, "just does not feel like eating".  Is drinking fluids and his Ensure/boost supplement.  Pain controlled.  Denies headache, no chest pain, no shortness of breath, no abdominal pain.  No acute events overnight per nursing staff.  Objective: Vitals:   01/31/20 1310 01/31/20 2113 02/01/20 0500 02/01/20 0507  BP: 115/84 125/89  124/89  Pulse: 100 (!) 108  (!) 102  Resp: (!) 24 18    Temp: 98.9 F (37.2 C) 99.7 F (37.6 C)  99.3 F (37.4 C)  TempSrc: Oral Oral  Oral  SpO2: 98% 96%  94%  Weight:   77 kg   Height:        Intake/Output Summary (Last 24 hours) at 02/01/2020 1001 Last data filed at 02/01/2020 0900 Gross per 24 hour  Intake 1699.8 ml  Output 975 ml  Net 724.8 ml   Filed Weights   01/29/20 0000 01/31/20 0500 02/01/20 0500  Weight: 70.4 kg 67.8 kg 77 kg    Examination:  General exam: Appears calm and comfortable, thin/cachectic in appearance Respiratory system: Clear to auscultation. Respiratory effort normal.  Oxygenating well on room air Cardiovascular system: S1 & S2 heard, RRR. No JVD, murmurs, rubs, gallops or clicks. No pedal edema. Gastrointestinal system: Abdomen is nondistended, soft and nontender. No organomegaly or masses  felt. Normal bowel  sounds heard. GU: Foley catheter noted in place draining clear yellow urine Central nervous system: Alert and oriented. No focal neurological deficits. Extremities: Symmetric 5 x 5 power. Skin: No rashes, lesions or ulcers Psychiatry: Judgement and insight appear poor. Mood & affect appropriate.     Data Reviewed: I have personally reviewed following labs and imaging studies  CBC: Recent Labs  Lab 01/28/20 1716 01/29/20 0718 01/30/20 0800 01/31/20 0454 02/01/20 0436  WBC 11.0* 7.8 8.0 8.9 10.9*  NEUTROABS 8.4* 6.0  --   --   --   HGB 6.7* 10.4* 8.5* 8.6* 8.6*  HCT 21.0* 32.4* 24.9* 25.8* 26.1*  MCV 84.3 84.8 81.9 83.0 83.7  PLT 135* 122* 84* 77* 71*   Basic Metabolic Panel: Recent Labs  Lab 01/28/20 1716 01/28/20 1738 01/29/20 0718 01/30/20 0800 01/31/20 0454 02/01/20 0436  NA 138  --  140 142 143 143  K 4.4  --  4.3 3.5 3.5 3.4*  CL 108  --  110 115* 115* 116*  CO2 15*  --  14* 16* 19* 18*  GLUCOSE 225*  --  101* 118* 113* 131*  BUN 37*  --  41* 38* 32* 28*  CREATININE 2.42*  --  2.14* 1.66* 1.43* 1.36*  CALCIUM 5.5*  --  6.0* 6.1* 6.8* 7.2*  MG  --  1.9 1.9 1.8 2.0 2.0   GFR: Estimated Creatinine Clearance: 50.8 mL/min (A) (by C-G formula based on SCr of 1.36 mg/dL (H)). Liver Function Tests: Recent Labs  Lab 01/28/20 1716 01/29/20 0718 01/30/20 0800 01/31/20 0454 02/01/20 0436  AST 235* 217* 129* 96* 85*  ALT 132* 139* 107* 96* 89*  ALKPHOS 870* 979* 763* 752* 746*  BILITOT 1.4* 3.2* 2.5* 3.1* 2.9*  PROT 5.9* 6.5 5.1* 5.0* 5.2*  ALBUMIN 1.8* 2.1* 1.5* 1.4* 1.4*   No results for input(s): LIPASE, AMYLASE in the last 168 hours. Recent Labs  Lab 01/29/20 0100  AMMONIA 15   Coagulation Profile: Recent Labs  Lab 01/28/20 1716 01/29/20 0100 01/30/20 0800 01/31/20 0454 02/01/20 0436  INR 2.1* 2.2* 1.8* 1.7* 1.6*   Cardiac Enzymes: No results for input(s): CKTOTAL, CKMB, CKMBINDEX, TROPONINI in the last 168 hours. BNP (last 3 results) No  results for input(s): PROBNP in the last 8760 hours. HbA1C: No results for input(s): HGBA1C in the last 72 hours. CBG: Recent Labs  Lab 01/29/20 2344  GLUCAP 123*   Lipid Profile: No results for input(s): CHOL, HDL, LDLCALC, TRIG, CHOLHDL, LDLDIRECT in the last 72 hours. Thyroid Function Tests: No results for input(s): TSH, T4TOTAL, FREET4, T3FREE, THYROIDAB in the last 72 hours. Anemia Panel: No results for input(s): VITAMINB12, FOLATE, FERRITIN, TIBC, IRON, RETICCTPCT in the last 72 hours. Sepsis Labs: Recent Labs  Lab 01/28/20 1716 01/28/20 1941 01/29/20 0100 01/30/20 0751  LATICACIDVEN 5.8* 5.8* 3.5* 1.3    Recent Results (from the past 240 hour(s))  Blood Culture (routine x 2)     Status: Abnormal (Preliminary result)   Collection Time: 01/28/20  5:08 PM   Specimen: BLOOD  Result Value Ref Range Status   Specimen Description   Final    BLOOD RIGHT ANTECUBITAL Performed at [facility], [place] [address] [place], [place] [address]    Special Requests   Final    BOTTLES DRAWN AEROBIC AND ANAEROBIC Blood Culture adequate volume Performed at [place] [address], [place], [place] [address]    Culture  Setup Time   Final  GRAM NEGATIVE RODS IN BOTH AEROBIC AND ANAEROBIC BOTTLES CRITICAL RESULT CALLED TO, READ BACK BY AND VERIFIED WITH: PHARMD MARY @0829  01/29/20 AKT    Culture (A)  Final    ESCHERICHIA COLI SUSCEPTIBILITIES PERFORMED ON PREVIOUS CULTURE WITHIN THE LAST 5 DAYS. Performed at [facility], [place] [address] [place], [place] [address]    Report Status PENDING  Incomplete  Blood Culture ID Panel (Reflexed)     Status: Abnormal   Collection Time: 01/28/20  5:08 PM  Result Value Ref Range Status   Enterococcus species DETECTED (A) NOT DETECTED Corrected    Comment: CRITICAL RESULT CALLED TO, READ BACK BY AND VERIFIED WITH: PHARMD MARY @0829  01/29/20 AKT CORRECTED ON 02/19 AT 0936: PREVIOUSLY REPORTED AS  CRITICAL RESULT CALLED TO, READ BACK BY AND VERIFIED WITH: PHARMD MARY @0829  01/29/20 AKT    Vancomycin resistance NOT DETECTED NOT DETECTED Final   Listeria monocytogenes NOT DETECTED NOT DETECTED Final   Staphylococcus species NOT DETECTED NOT DETECTED Final   Staphylococcus aureus (BCID) NOT DETECTED NOT DETECTED Final   Streptococcus species NOT DETECTED NOT DETECTED Final   Streptococcus agalactiae NOT DETECTED NOT DETECTED Final   Streptococcus pneumoniae NOT DETECTED NOT DETECTED Final   Streptococcus pyogenes NOT DETECTED NOT DETECTED Final   Acinetobacter baumannii NOT DETECTED NOT DETECTED Final   Enterobacteriaceae species DETECTED (A) NOT DETECTED Final    Comment: Enterobacteriaceae represent a large family of gram-negative bacteria, not a single organism. CRITICAL RESULT CALLED TO, READ BACK BY AND VERIFIED WITH: PHARMD MARY @0829  01/29/20 AKT    Enterobacter cloacae complex NOT DETECTED NOT DETECTED Final   Escherichia coli DETECTED (A) NOT DETECTED Final    Comment: CRITICAL RESULT CALLED TO, READ BACK BY AND VERIFIED WITH: PHARMD MARY @0829  01/29/20 AKT    Klebsiella oxytoca NOT DETECTED NOT DETECTED Final   Klebsiella pneumoniae NOT DETECTED NOT DETECTED Final   Proteus species NOT DETECTED NOT DETECTED Final   Serratia marcescens NOT DETECTED NOT DETECTED Final   Carbapenem resistance NOT DETECTED NOT DETECTED Final   Haemophilus influenzae NOT DETECTED NOT DETECTED Final   Neisseria meningitidis NOT DETECTED NOT DETECTED Final   Pseudomonas aeruginosa NOT DETECTED NOT DETECTED Final   Candida albicans NOT DETECTED NOT DETECTED Final   Candida glabrata NOT DETECTED NOT DETECTED Final   Candida krusei NOT DETECTED NOT DETECTED Final   Candida parapsilosis NOT DETECTED NOT DETECTED Final   Candida tropicalis NOT DETECTED NOT DETECTED Final    Comment: Performed at [facility], [place] [address] [place], [place] [address]  Blood Culture (routine x 2)     Status:  Abnormal (Preliminary result)   Collection Time: 01/28/20  5:16 PM   Specimen: BLOOD LEFT HAND  Result Value Ref Range Status   Specimen Description   Final    BLOOD LEFT HAND Performed at [place] [address] [place], [place] [address]    Special Requests   Final    BOTTLES DRAWN AEROBIC AND ANAEROBIC Blood Culture adequate volume Performed at [place] [address] [place], [place] [address]    Culture  Setup Time   Final    GRAM NEGATIVE RODS IN BOTH AEROBIC AND ANAEROBIC BOTTLES CRITICAL VALUE NOTED.  VALUE IS CONSISTENT WITH PREVIOUSLY REPORTED AND CALLED VALUE. Performed at [facility], [place] [address] [place],  [address]    Culture ESCHERICHIA COLI (A)  Final   Report Status PENDING  Incomplete  Organism ID, Bacteria ESCHERICHIA COLI  Final      Susceptibility   Escherichia coli - MIC*    AMPICILLIN 16 INTERMEDIATE Intermediate     CEFAZOLIN <=4 SENSITIVE Sensitive     CEFEPIME <=0.12 SENSITIVE Sensitive     CEFTAZIDIME <=1 SENSITIVE Sensitive     CEFTRIAXONE <=0.25 SENSITIVE Sensitive     CIPROFLOXACIN <=0.25 SENSITIVE Sensitive     GENTAMICIN <=1 SENSITIVE Sensitive     IMIPENEM <=0.25 SENSITIVE Sensitive     TRIMETH/SULFA <=20 SENSITIVE Sensitive     AMPICILLIN/SULBACTAM <=2 SENSITIVE Sensitive     PIP/TAZO <=4 SENSITIVE Sensitive     * ESCHERICHIA COLI  Urine culture     Status: Abnormal   Collection Time: 01/28/20  5:54 PM   Specimen: In/Out Cath Urine  Result Value Ref Range Status   Specimen Description   Final    IN/OUT CATH URINE Performed at [place] [address] [place], [place] [address]    Special Requests   Final    NONE Performed at [facility], [place] [address] [place], [place] [address]    Culture MULTIPLE SPECIES PRESENT, SUGGEST RECOLLECTION (A)  Final   Report Status 01/29/2020 FINAL  Final  SARS CORONAVIRUS 2 (TAT 6-24 HRS)  Nasopharyngeal Nasopharyngeal Swab     Status: None   Collection Time: 01/28/20  8:54 PM   Specimen: Nasopharyngeal Swab  Result Value Ref Range Status   SARS Coronavirus 2 NEGATIVE NEGATIVE Final    Comment: (NOTE) SARS-CoV-2 target nucleic acids are NOT DETECTED. The SARS-CoV-2 RNA is generally detectable in upper and lower respiratory specimens during the acute phase of infection. Negative results do not preclude SARS-CoV-2 infection, do not rule out co-infections with other pathogens, and should not be used as the sole basis for treatment or other patient management decisions. Negative results must be combined with clinical observations, patient history, and epidemiological information. The expected result is Negative. Fact Sheet for Patients: SugarRoll.be Fact Sheet for Healthcare Providers: https://www.woods-mathews.com/ This test is not yet approved or cleared by the [place] FDA and  has been authorized for detection and/or diagnosis of SARS-CoV-2 by FDA under an Emergency Use Authorization (EUA). This EUA will remain  in effect (meaning this test can be used) for the duration of the COVID-19 declaration under Section 56 4(b)(1) of the Act, 21 U.S.C. section 360bbb-3(b)(1), unless the authorization is terminated or revoked sooner. Performed at [facility], [place] [address] [place], [place] [address]   MRSA PCR Screening     Status: None   Collection Time: 01/28/20 11:55 PM   Specimen: Nasal Mucosa; Nasopharyngeal  Result Value Ref Range Status   MRSA by PCR NEGATIVE NEGATIVE Final    Comment:        The GeneXpert MRSA Assay (FDA approved for NASAL specimens only), is one component of a comprehensive MRSA colonization surveillance program. It is not intended to diagnose MRSA infection nor to guide or monitor treatment for MRSA infections. Performed at [facility], [place] [address]  [place], [place] [address]   Culture, blood (Routine X 2) w Reflex to ID Panel     Status: None (Preliminary result)   Collection Time: 01/30/20  8:00 AM   Specimen: BLOOD  Result Value Ref Range Status   Specimen Description   Final    BLOOD RIGHT ARM Performed at [place] [address] [place], [place] [address]    Special Requests  Final    BOTTLES DRAWN AEROBIC AND ANAEROBIC Blood Culture adequate volume Performed at [place] [address] [place], [place] [address]    Culture   Final    NO GROWTH 2 DAYS Performed at [place] [address] [place], [place] [address]    Report Status PENDING  Incomplete         Radiology Studies: No results found.      Scheduled Meds: . bicalutamide  50 mg Oral Daily  . Chlorhexidine Gluconate Cloth  6 each Topical Daily  . feeding supplement  1 Container Oral BID BM  . feeding supplement (ENSURE ENLIVE)  237 mL Oral BID BM  . mouth rinse  15 mL Mouth Rinse BID  . metoprolol tartrate  25 mg Oral BID  . multivitamin with minerals  1 tablet Oral Daily  . potassium chloride  30 mEq Oral Q3H  . sodium chloride flush  3 mL Intravenous Q12H   Continuous Infusions: . sodium chloride 75 mL/hr at 01/31/20 2249  . ampicillin-sulbactam (UNASYN) IV 3 g (02/01/20 0535)     LOS: 4 days    Time spent: 34 minutes spent on chart review, discussion with nursing staff, consultants, updating family and interview/physical exam; more than 50% of that time was spent in counseling and/or coordination of care.    Biff Rutigliano J [place] DO [facility] Available via Epic secure chat 7am-7pm After these hours, please refer to coverage provider listed on [email or web address] 02/01/2020, 10:01 AM

## 2020-02-01 NOTE — Progress Notes (Signed)
Daily Progress Note   Patient Name: Frank Daugherty       Date: 02/01/2020 DOB: May 12, 1946  Age: 74 y.o. MRN#: [id number] Attending Physician: [place] Eric J, DO Primary Care Physician: Patient, No Pcp Per Admit Date: 01/28/2020  Reason for Consultation/Follow-up: Establishing goals of care  Subjective:  Frank Daugherty is awake alert resting in bed. He appears chronically ill. No acute signs of distress or discomfort. He denies pain, denies shortness of breath. Breakfast tray is nearly untouched, except for a few bites of grits that he has had. Patient denies feeling hungry.   Length of Stay: 4  Current Medications: Scheduled Meds:  . bicalutamide  50 mg Oral Daily  . Chlorhexidine Gluconate Cloth  6 each Topical Daily  . feeding supplement  1 Container Oral BID BM  . feeding supplement (ENSURE ENLIVE)  237 mL Oral BID BM  . mouth rinse  15 mL Mouth Rinse BID  . metoprolol tartrate  50 mg Oral BID  . multivitamin with minerals  1 tablet Oral Daily  . potassium chloride  30 mEq Oral Q3H  . sodium chloride flush  3 mL Intravenous Q12H    Continuous Infusions: . ampicillin-sulbactam (UNASYN) IV 3 g (02/01/20 0535)    PRN Meds: acetaminophen **OR** acetaminophen  Physical Exam         Appears weak No distress acutely Regular work of breathing S1 S2 Abdomen is not distended Has foley No edema  Vital Signs: BP 124/89 (BP Location: Right Arm)   Pulse (!) 102   Temp 99.3 F (37.4 C) (Oral)   Resp 18   Ht 5\' 11"  (1.803 m)   Wt 77 kg   SpO2 94%   BMI 23.68 kg/m  SpO2: SpO2: 94 % O2 Device: O2 Device: Room Air O2 Flow Rate: O2 Flow Rate (L/min): 2 L/min  Intake/output summary:   Intake/Output Summary (Last 24 hours) at 02/01/2020 1041 Last data filed at 02/01/2020  0900 Gross per 24 hour  Intake 1699.8 ml  Output 975 ml  Net 724.8 ml   LBM: Last BM Date: 01/29/20 Baseline Weight: Weight: 71.7 kg Most recent weight: Weight: 77 kg       Palliative Assessment/Data:      Patient Active Problem List   Diagnosis Date Noted  . Severe sepsis with septic shock ([place])  01/28/2020  . Acute lower UTI 01/28/2020  . Elevated lactic acid level 01/28/2020  . Acute on chronic anemia 01/28/2020  . Acute metabolic encephalopathy 99991111  . AKI (acute kidney injury) ([place]) 01/28/2020  . Hypocalcemia 01/28/2020  . Transaminitis 01/28/2020  . Metastatic carcinoma to bone ([place]) 01/20/2020  . Abnormal liver function tests   . Abnormal serum level of alkaline phosphatase   . Abnormal MRI of abdomen   . Atrial fibrillation with RVR ([place]) 01/05/2020  . Renal insufficiency 01/05/2020  . Coagulopathy ([place]) 01/05/2020  . Hyperglycemia 01/05/2020  . HTN (hypertension) 04/23/2012  . 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   Discussed about code status and broad goals of care with patient again, subsequently also discussed with son Frank Daugherty on the phone: SNF rehab with palliative care is recommended, patient believes he is slowly getting better, "let them do what they can to help me, I'd want that."  Remains partial code: ok for CPR and no intubation as per his wishes. PMT will continue to follow, will also benefit from ongoing palliative support at the cancer center as well.   Code Status:    Code Status Orders  (From admission, onward)         Start      Ordered   01/28/20 2148  Full code  Continuous     01/28/20 2151        Code Status History    Date Active Date Inactive Code Status Order ID Comments User Context   01/05/2020 2201 01/14/2020 2251 Full Code [id number]  Vianne Bulls, MD Inpatient   Advance Care Planning Activity       Prognosis:   guarded. ?6-12 months.   Discharge Planning:  [place] for rehab with Palliative care service follow-up  Care plan was discussed with  Patient, call placed and also discussed with son Frank Daugherty as well.   Thank you for allowing the Palliative Medicine Team to assist in the care of this patient.   Time In: 10 Time Out: 10.35 Total Time 35 Prolonged Time Billed No       Greater than 50%  of this time was spent counseling and coordinating care related to the above assessment and plan.  Loistine Chance, MD  Please contact Palliative Medicine Team phone at [phone] for questions and concerns.

## 2020-02-01 NOTE — NC FL2 (Signed)
[place] LEVEL OF CARE SCREENING TOOL     IDENTIFICATION  Patient Name: Frank Daugherty Birthdate: Apr 25, 1946 Sex: male Admission Date (Current Location): 01/28/2020  [facility] and [place] Number:  Herbalist and Address:  [facility],  [address] [place], [place]      Provider Number: [id number]  Attending Physician Name and Address:  [place] Eric J, DO  Relative Name and Phone Number:  Loyde Rzepecki (wife)- [phone]    Current Level of Care: Hospital Recommended Level of Care: [employer] Prior Approval Number:    Date Approved/Denied:   PASRR Number: [id number]  Discharge Plan: SNF    Current Diagnoses: Patient Active Problem List   Diagnosis Date Noted  . Palliative care by specialist   . Goals of care, counseling/discussion   . General weakness   . Severe sepsis with septic shock ([place]) 01/28/2020  . Acute lower UTI 01/28/2020  . Elevated lactic acid level 01/28/2020  . Acute on chronic anemia 01/28/2020  . Acute metabolic encephalopathy 99991111  . AKI (acute kidney injury) ([place]) 01/28/2020  . Hypocalcemia 01/28/2020  . Transaminitis 01/28/2020  . 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 Abnormal liver function tests   . Abnormal serum level of alkaline phosphatase   . Abnormal MRI of abdomen   . Atrial fibrillation with RVR ([place]) 01/05/2020  . Renal insufficiency 01/05/2020  . Coagulopathy ([place]) 01/05/2020  . Hyperglycemia 01/05/2020  . HTN (hypertension) 04/23/2012  . HLD (hyperlipidemia) 04/23/2012    Orientation RESPIRATION BLADDER Height & Weight     Self, Time, Situation, Place  Normal Indwelling catheter Weight: 77 kg Height:  5\' 11"  (180.3 cm)  BEHAVIORAL SYMPTOMS/MOOD NEUROLOGICAL BOWEL NUTRITION STATUS      Continent Diet(Regular)  AMBULATORY STATUS COMMUNICATION OF NEEDS Skin   Limited Assist Verbally Normal                       Personal Care Assistance Level  of Assistance  Bathing, Feeding, Dressing Bathing Assistance: Limited assistance Feeding assistance: Limited assistance Dressing Assistance: Limited assistance     Functional Limitations Info  Sight, Hearing, Speech Sight Info: Impaired(eyeglasses) Hearing Info: Adequate Speech Info: Adequate    SPECIAL CARE FACTORS FREQUENCY  PT (By licensed PT), OT (By licensed OT)     PT Frequency: 5x week OT Frequency: 5x week            Contractures Contractures Info: Not present    Additional Factors Info  Code Status, Allergies Code Status Info: Full code Allergies Info: NKA           Current Medications (02/01/2020):  This is the current hospital active medication list Current Facility-Administered Medications  Medication Dose Route Frequency Provider Last Rate Last Admin  . acetaminophen (TYLENOL) tablet 650 mg  650 mg Oral Q6H PRN Howerter, Justin B, DO   650 mg at 01/29/20 1602   Or  . acetaminophen (TYLENOL) suppository 650 mg  650 mg Rectal Q6H PRN Howerter, Justin B, DO      . Ampicillin-Sulbactam (UNASYN) 3 g in sodium chloride 0.9 % 100 mL IVPB  3 g Intravenous Q8H Poindexter, Leann T, RPH 200 mL/hr at 02/01/20 1302 3 g at 02/01/20 1302  . bicalutamide (CASODEX) tablet 50 mg  50 mg Oral Daily Truitt Merle, MD   50 mg at 02/01/20 1014  . Chlorhexidine Gluconate Cloth 2 % PADS 6 each  6 each Topical Daily Howerter, Justin B,  DO   6 each at 02/01/20 1014  . feeding supplement (BOOST / RESOURCE BREEZE) liquid 1 Container  1 Container Oral BID BM [place] Donnamarie Poag, DO   1 Container at 02/01/20 1305  . feeding supplement (ENSURE ENLIVE) (ENSURE ENLIVE) liquid 237 mL  237 mL Oral BID BM [place] Eric J, DO   237 mL at 01/31/20 1452  . MEDLINE mouth rinse  15 mL Mouth Rinse BID Howerter, Justin B, DO   15 mL at 02/01/20 1014  . metoprolol tartrate (LOPRESSOR) tablet 50 mg  50 mg Oral BID [place] Eric J, DO      . multivitamin with minerals tablet 1 tablet  1 tablet Oral Daily  [place] Donnamarie Poag, DO   1 tablet at 02/01/20 1014  . sodium chloride flush (NS) 0.9 % injection 3 mL  3 mL Intravenous Q12H Howerter, Justin B, DO   3 mL at 02/01/20 1015     Discharge Medications: Please see discharge summary for a list of discharge medications.  Relevant Imaging Results:  Relevant Lab Results:   Additional Information SSN#- [id number]  [name], RN

## 2020-02-01 NOTE — Progress Notes (Addendum)
HEMATOLOGY-ONCOLOGY PROGRESS NOTE  SUBJECTIVE: The patient started Lupron Casodex this past weekend.  He was seen by urology who does not recommend any additional work-up including a biopsy for his prostate cancer.  They plan to keep the indwelling Foley in place and will consider outpatient voiding trial and possible channel TURP at some point after discharge.  The patient is resting quietly this morning.  He offers no complaints.  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 Slightly distended gallbladder with sludge, but negative for wall thickness, sonographic Murphy, or other features to suggest acute gallbladder disease. Common duct diameter upper limits of normal 2. Slightly echogenic liver with multiple cysts     01/06/2020 Imaging   MRI Abdomen 01/06/20 IMPRESSION: 1. Marked heterogeneity of the visualized marrow spaces of the spine and pelvis. Areas of restricted diffusion and abnormal enhancement on a background of iron deposition. Correlate with any history of chronic anemia and exogenous iron administration or transfusions. Findings raise the question of process such as multiple myeloma, perhaps this would explain the elevated alkaline phosphatase which is more pronounced than other hepatic enzyme elevations. 2. Numerous hepatic cysts and signs of hepatic and splenic iron deposition. 3. Trace ascites about the liver. 4. No signs of biliary ductal dilation or filling defect. 5. Sludge layering in a mildly distended gallbladder.   01/14/2020 Initial Biopsy   DIAGNOSIS: 01/14/20  BONE MARROW, ASPIRATE, CLOT, CORE:  - Metastatic carcinoma, see comment.   PERIPHERAL BLOOD:  - Normocytic anemia.  - Thrombocytopenia.   COMMENT:  The carcinoma is poorly differentiated and the immunoprofile is somewhat  non-specific perhaps due to decalcification. However, given the focal  neuroendocrine and prostate markers, a  prostate primary should be  considered.    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  I have reviewed the past medical history, past surgical history, social history and family history with the patient and they are unchanged from previous note.   PHYSICAL EXAMINATION: ECOG PERFORMANCE STATUS: 2 - Symptomatic, <50% confined to bed  Vitals:   01/31/20 2113 02/01/20 0507  BP: 125/89 124/89  Pulse: (!) 108 (!) 102  Resp: 18   Temp: 99.7 F (37.6 C) 99.3 F (37.4 C)  SpO2: 96% 94%   Filed Weights   01/29/20 0000 01/31/20 0500 02/01/20 0500  Weight: 155 lb 3.3 oz (70.4 kg) 149 lb 7.6 oz (67.8 kg) 169 lb 12.1 oz (77 kg)    Intake/Output from previous day: 02/21 0701 - 02/22 0700 In: 2143.3 [P.O.:360; I.V.:1580.6; IV Piggyback:202.7] Out: 1200 [Urine:1200]  GENERAL: Chronically ill-appearing male, no distress OROPHARYNX:no exudate, no erythema and lips, buccal mucosa, and tongue normal  LYMPH:  no palpable lymphadenopathy in the cervical, axillary or inguinal LUNGS: clear to auscultation and percussion with normal breathing effort HEART: Tachycardic, no murmurs and no lower extremity edema ABDOMEN:abdomen soft, non-tender and normal bowel sounds Musculoskeletal:no cyanosis of digits and no clubbing  NEURO: alert & oriented x 3 with fluent speech, no focal motor/sensory deficits  LABORATORY DATA:  I have reviewed the data as listed CMP Latest Ref Rng & Units 02/01/2020 01/31/2020 01/30/2020  Glucose 70 - 99 mg/dL 131(H) 113(H) 118(H)  BUN 8 - 23 mg/dL 28(H) 32(H) 38(H)  Creatinine 0.61 - 1.24 mg/dL 1.36(H) 1.43(H) 1.66(H)  Sodium 135 - 145 mmol/L 143 143 142  Potassium 3.5 - 5.1 mmol/L 3.4(L) 3.5 3.5  Chloride 98 - 111 mmol/L 116(H) 115(H) 115(H)  CO2 22 - 32 mmol/L 18(L) 19(L) 16(L)  Calcium 8.9 - 10.3 mg/dL 7.2(L) 6.8(L) 6.1(LL)  Total Protein 6.5 - 8.1 g/dL 5.2(L) 5.0(L)  5.1(L)  Total Bilirubin 0.3 - 1.2 mg/dL 2.9(H) 3.1(H) 2.5(H)  Alkaline Phos 38 - 126 U/L 746(H) 752(H) 763(H)  AST 15 - 41 U/L 85(H) 96(H) 129(H)  ALT 0 - 44 U/L 89(H) 96(H) 107(H)    Lab Results  Component Value Date   WBC 10.9 (H) 02/01/2020   HGB 8.6 (L) 02/01/2020   HCT 26.1 (L) 02/01/2020   MCV 83.7 02/01/2020   PLT 71 (L) 02/01/2020   NEUTROABS 6.0 01/29/2020    CT CHEST WO CONTRAST  Result Date: 01/29/2020 CLINICAL DATA:  Elevated alkaline phosphatase. 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 Unknown primary. EXAM: CT CHEST WITHOUT CONTRAST TECHNIQUE: Multidetector CT imaging of the chest was performed following the standard protocol without IV contrast. COMPARISON:  Chest radiograph 01/28/2020.  Abdominal MRI 01/06/2020. FINDINGS: Cardiovascular: Mild to moderate motion degradation throughout. Aortic atherosclerosis. Tortuous thoracic aorta. Mild cardiomegaly, without pericardial effusion. Multivessel coronary artery atherosclerosis. Mediastinum/Nodes: No mediastinal or definite hilar adenopathy, given limitations of unenhanced CT. Tiny hiatal hernia. Lungs/Pleura: Small bilateral pleural effusions. Right minor fissure thickening on 60/7. Bibasilar atelectasis. Areas of smooth septal thickening are mild and most apparent at the lung apices. No dominant pulmonary nodule or mass. Upper Abdomen: Low-density liver lesions or characterized as cysts on prior dedicated MRI. Motion degradation containing in the upper abdomen. Incompletely imaged mildly distended gallbladder. Normal imaged portions of the stomach pancreas, adrenal glands. Musculoskeletal: Mild left gynecomastia. Anasarca. Diffuse sclerotic osseous metastasis. No vertebral body height loss. IMPRESSION: 1. Mild to moderate motion degradation.Given this limitation, no dominant lung mass. 2. Bilateral pleural effusions with mild septal thickening at the apices. Question mild fluid overload/congestive heart failure. 3. Diffuse sclerotic  osseous metastasis. This consistent with prostate primary, given PSA level of greater than 1,100 earlier today. 4. Gallbladder distension, incompletely imaged. Electronically Signed   By: Abigail Miyamoto M.D.   On: 01/29/2020 15:59   MR PELVIS W WO CONTRAST  Result Date: 01/29/2020 CLINICAL DATA:  Osseous metastatic disease. Elevated alkaline phosphatase. EXAM: MRI PELVIS WITHOUT AND WITH CONTRAST TECHNIQUE: Multiplanar multisequence MR imaging of the pelvis was performed both before and after administration of intravenous contrast. CONTRAST:  40m GADAVIST GADOBUTROL 1 MMOL/ML IV SOLN COMPARISON:  Abdominal MRI 01/06/2020. FINDINGS: Bones/Joint/Cartilage There is widespread heterogeneous marrow replacement throughout the pelvis, the visualized bilateral proximal femora, and lower lumbar spine. Heterogeneous enhancement throughout the marrow structures. No pathologic fracture is identified. No enhancing extraosseous soft tissue mass is seen. Trace bilateral hip joint effusions, nonspecific. 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 Pubic symphysis and SI joints are intact. Ligaments Grossly intact. Muscles and Tendons Mild diffuse intramuscular edema throughout the pelvic and visualized thigh musculature bilaterally. Tendinous structures about the pelvis appear grossly intact. Soft tissues Enlarged, heterogeneous prostate gland measuring 3.8 x 5.3 x 4.4 cm. Enhancing tumor with the mid gland to prostate apex on the left with restricted diffusion (series 12, images 21-22). Findings concerning for invasion of the outer surface of the adjacent urinary bladder (series 13, image 24; series 12, image 20). There is also a adjacent abnormal left internal iliac chain pelvic lymph node (series 13, image 17), likely a site of metastatic disease. Foley catheter is present within the urinary bladder. Trace free fluid within  the pelvis. Mild diffuse soft tissue/body wall edema. No soft tissue fluid collections.  IMPRESSION: 1. Enhancing tumor with the mid gland to prostate apex on the left with restricted diffusion suggesting high grade tumor. Findings concerning for invasion of the outer surface of the adjacent urinary bladder wall. There is also a adjacent abnormal left internal iliac chain pelvic lymph node, likely a site of metastatic disease. 2. Diffuse heterogeneous marrow replacement throughout the pelvis, visualized bilateral proximal femora, and lower lumbar spine, consistent with widespread osseous metastatic disease. No pathologic fracture. 3. Mild diffuse anasarca and small amount of free fluid within the pelvis. These results will be called to the ordering clinician or representative by the Radiologist Assistant, and communication documented in the PACS or zVision Dashboard. Electronically Signed   By: Davina Poke D.O.   On: 01/29/2020 16:10   DG Chest Port 1 View  Result Date: 01/28/2020 CLINICAL DATA:  Fever. EXAM: PORTABLE CHEST 1 VIEW COMPARISON:  January 05, 2020 FINDINGS: Mild cardiomegaly. The hila and mediastinum are unremarkable. No pneumothorax. No nodules or masses. No focal infiltrates. Mild interstitial prominence without overt edema. IMPRESSION: Mild interstitial prominence could be due to the portable technique versus pulmonary venous congestion. No focal infiltrates or other acute abnormalities. Electronically Signed   By: Dorise Bullion III M.D   On: 01/28/2020 17:37   DG Chest Port 1 View  Result Date: 01/05/2020 CLINICAL DATA:  Weakness EXAM: PORTABLE CHEST 1 VIEW COMPARISON:  None. FINDINGS: The heart size and mediastinal contours are within normal limits. No focal airspace consolidation, pleural effusion, or pneumothorax. The visualized skeletal structures are unremarkable. IMPRESSION: No active disease. Electronically Signed   By: Davina Poke D.O.   On: 01/05/2020 14:15   CT BONE MARROW BIOPSY & ASPIRATION  Result Date: 01/14/2020 INDICATION: 74 year old with anemia  and thrombocytopenia. Abnormal appearance of the bone marrow on MRI. EXAM: CT GUIDED BONE MARROW ASPIRATES AND BIOPSY Physician: Stephan Minister. Anselm Pancoast, MD MEDICATIONS: None. ANESTHESIA/SEDATION: Fentanyl 50 mcg IV; Versed 0.5 mg IV Moderate Sedation Time:  17 minutes The patient was continuously monitored during the procedure by the interventional radiology nurse under my direct supervision. COMPLICATIONS: None immediate. PROCEDURE: The procedure was explained to the patient. The risks and benefits of the procedure were discussed and the patient's questions were addressed. Informed consent was obtained from the patient. The patient was placed prone on CT table. Images of the pelvis were obtained. The right side of back was prepped and draped in sterile fashion. The skin and right posterior ilium were anesthetized with 1% lidocaine. 11 gauge bone needle was directed into the right ilium with CT guidance. Initially, no aspirate could be obtained. Therefore, a core biopsy was obtained. Needle was directed back into the right ilium and approximately 1-2 mL of aspirate could be obtained. Second core biopsy was obtained. Bandage placed over the puncture site. FINDINGS: Pelvic bones are very heterogeneous with scattered areas of sclerosis. Minimal aspirate could be obtained from the bone marrow. Two adequate core biopsies were obtained. IMPRESSION: CT guided bone marrow aspiration and core biopsy. Electronically Signed   By: Markus Daft M.D.   On: 01/14/2020 13:59   MR ABDOMEN MRCP W WO CONTAST  Result Date: 01/06/2020 CLINICAL DATA:  Painless jaundice and weight loss. EXAM: MRI ABDOMEN WITHOUT AND WITH CONTRAST (INCLUDING MRCP) TECHNIQUE: Multiplanar multisequence MR imaging of the abdomen was performed both before and after the administration of intravenous contrast. Heavily T2-weighted images of the biliary and pancreatic ducts were  obtained, and three-dimensional MRCP images were rendered by post processing. CONTRAST:  106m  GADAVIST GADOBUTROL 1 MMOL/ML IV SOLN COMPARISON:  Ultrasound evaluation 01/05/2020 FINDINGS: Lower chest: Assessment of the lung bases is limited on MR. No signs of consolidation or pleural effusion. Hepatobiliary: Numerous hepatic cysts, signs of hepatic iron deposition. Is. Sludge layering in the dependent gallbladder. No signs of filling defect in the biliary tree. Pancreas: Limited assessment due to respiratory motion. No signs of ductal dilation or focal lesion. Arterial phase imaging is showing reasonable quality. Spleen:  Normal size spleen with signs of splenic iron deposition. Adrenals/Urinary Tract: Normal adrenals. Symmetric enhancement of bilateral kidneys. No signs of hydronephrosis. Stomach/Bowel: Visualized gastrointestinal tract is unremarkable with very limited assessment due to respiratory motion and without protocol for bowel assessment on MR. Vascular/Lymphatic: Patent abdominal vasculature. No signs of adenopathy. Other:  Trace ascites about the liver. Musculoskeletal: Very heterogeneous marrow signal with signs of iron deposition in the marrow spaces. Assessment on in and out of phase is limited. Scattered areas of relative restricted diffusion are noted though diffusion is limited in the setting of iron deposition. Heterogeneous patchy marrow space enhancement is noted throughout. IMPRESSION: 1. Marked heterogeneity of the visualized marrow spaces of the spine and pelvis. Areas of restricted diffusion and abnormal enhancement on a background of iron deposition. Correlate with any history of chronic anemia and exogenous iron administration or transfusions. Findings raise the question of process such as multiple myeloma, perhaps this would explain the elevated alkaline phosphatase which is more pronounced than other hepatic enzyme elevations. 2. Numerous hepatic cysts and signs of hepatic and splenic iron deposition. 3. Trace ascites about the liver. 4. No signs of biliary ductal dilation or  filling defect. 5. Sludge layering in a mildly distended gallbladder. Electronically Signed   By: GZetta BillsM.D.   On: 01/06/2020 19:56   ECHOCARDIOGRAM COMPLETE  Result Date: 01/06/2020   ECHOCARDIOGRAM REPORT   Patient Name:   LDEONTEZ KLINKEDate of Exam: 01/06/2020 Medical Rec #:  [id number]    Height:       69.8 in Accession #:    [id number]   Weight:       194.5 lb Date of Birth:  [birth year]   BSA:          2.06 m Patient Age:    89years      BP:           112/70 mmHg Patient Gender: M             HR:           120 bpm. Exam Location:  Inpatient Procedure: 2D Echo Indications:    Atrial fibrillation  History:        Patient has no prior history of Echocardiogram examinations.                 Risk Factors:Hypertension and Dyslipidemia.  Sonographer:    AClayton LefortRDCS (AE) Referring Phys: [id number] 1. Left ventricular ejection fraction, by visual estimation, is 55 to 60%. The left ventricle has normal function. There is mildly increased left ventricular hypertrophy.  2. Left ventricular diastolic function could not be evaluated.  3. The left ventricle has no regional wall motion abnormalities.  4. Global right ventricle has normal systolic function.The right ventricular size is normal. No increase in right ventricular wall thickness.  5. Left atrial size was normal.  6. Right atrial size was normal.  7. Mild mitral annular calcification.  8. The mitral valve is grossly normal. Mild mitral valve regurgitation.  9. The tricuspid valve is normal in structure. 10. The tricuspid valve is normal in structure. Tricuspid valve regurgitation is trivial. 11. The aortic valve is tricuspid. Aortic valve regurgitation is not visualized. 12. The pulmonic valve was not well visualized. Pulmonic valve regurgitation is not visualized. 13. The inferior vena cava is normal in size with greater than 50% respiratory variability, suggesting right atrial pressure of 3 mmHg. FINDINGS  Left  Ventricle: Left ventricular ejection fraction, by visual estimation, is 55 to 60%. The left ventricle has normal function. The left ventricle has no regional wall motion abnormalities. There is mildly increased left ventricular hypertrophy. Concentric left ventricular hypertrophy. The left ventricular diastology could not be evaluated due to nondiagnostic images. Left ventricular diastolic function could not be evaluated. Right Ventricle: The right ventricular size is normal. No increase in right ventricular wall thickness. Global RV systolic function is has normal systolic function. Left Atrium: Left atrial size was normal in size. Right Atrium: Right atrial size was normal in size Pericardium: There is no evidence of pericardial effusion. Mitral Valve: The mitral valve is grossly normal. There is mild thickening of the mitral valve leaflet(s). There is mild calcification of the mitral valve leaflet(s). Mild mitral annular calcification. Mild mitral valve regurgitation. Tricuspid Valve: The tricuspid valve is normal in structure. Tricuspid valve regurgitation is trivial. Aortic Valve: The aortic valve is tricuspid. . There is mild thickening and mild calcification of the aortic valve. Aortic valve regurgitation is not visualized. There is mild thickening of the aortic valve. There is mild calcification of the aortic valve. Pulmonic Valve: The pulmonic valve was not well visualized. Pulmonic valve regurgitation is not visualized. Pulmonic regurgitation is not visualized. Aorta: The aortic root, ascending aorta and aortic arch are all structurally normal, with no evidence of dilitation or obstruction. Pulmonary Artery: The pulmonary artery is not well seen. Venous: The inferior vena cava is normal in size with greater than 50% respiratory variability, suggesting right atrial pressure of 3 mmHg. IAS/Shunts: No atrial level shunt detected by color flow Doppler.  LEFT VENTRICLE PLAX 2D LVIDd:         4.70 cm LVIDs:          3.30 cm LV PW:         1.25 cm LV IVS:        1.40 cm LVOT diam:     2.00 cm LV SV:         58 ml LV SV Index:   27.70 LVOT Area:     3.14 cm  RIGHT VENTRICLE             IVC RV Basal diam:  3.10 cm     IVC diam: 1.20 cm RV S prime:     15.70 cm/s TAPSE (M-mode): 2.3 cm LEFT ATRIUM           Index       RIGHT ATRIUM           Index LA diam:      3.10 cm 1.51 cm/m  RA Area:     15.80 cm LA Vol (A2C): 42.8 ml 20.80 ml/m RA Volume:   40.20 ml  19.54 ml/m LA Vol (A4C): 53.2 ml 25.85 ml/m  AORTIC VALVE LVOT Vmax:   125.00 cm/s LVOT Vmean:  80.740 cm/s LVOT VTI:    0.227 m  AORTA Ao Root diam: 3.30 cm  Ao Asc diam:  3.10 cm  SHUNTS Systemic VTI:  0.23 m Systemic Diam: 2.00 cm  Buford Dresser MD Electronically signed by Buford Dresser MD Signature Date/Time: 01/06/2020/1:14:41 PM    Final    US Abdomen Limited RUQ  Result Date: 01/05/2020 CLINICAL DATA:  Abdominal pain for 2 weeks EXAM: ULTRASOUND ABDOMEN LIMITED RIGHT UPPER QUADRANT COMPARISON:  None. Chest x-ray 01/05/2020 FINDINGS: Gallbladder: Slightly distended. Moderate sludge in the gallbladder. No shadowing stones. Normal wall thickness. Negative sonographic Murphy Common bile duct: Diameter: 6 mm Liver: Liver is slightly echogenic. Multiple cysts within the liver. The largest is seen in the posterior right hepatic lobe and measures 2.3 x 1.8 x 1.6 cm. Portal vein is patent on color Doppler imaging with normal direction of blood flow towards the liver. Other: None. IMPRESSION: 1. Slightly distended gallbladder with sludge, but negative for wall thickness, sonographic Murphy, or other features to suggest acute gallbladder disease. Common duct diameter upper limits of normal 2. Slightly echogenic liver with multiple cysts Electronically Signed   By: Donavan Foil M.D.   On: 01/05/2020 18:07    ASSESSMENT AND PLAN: 1.  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  Septic shock secondary to UTI 3.  Elevated alkaline phosphatase 4.  Acute  on chronic anemia 5.  AKI 6.  Coagulopathy 7.  Hypocalcemia, improved 8.  Paroxysmal atrial fibrillation  -PSA from 01/29/2020 was 1100.  MRI of the pelvis consistent with prostate cancer with invasion into the bladder and bone metastasis.  He has been started on Lupron and Casodex.  Plan to begin abiraterone once available from our pharmacy. -Remains on IV antibiotics for UTI and E. coli and Enterococcus bacteremia.  ID following. -Hemoglobin is stable today.  Recommend transfusion to keep his hemoglobin above 8.  Suspect anemia is related to underlying malignancy and bone marrow involvement as well as acute infection. -Renal function slowly improving with IV fluids.  Will monitor this for now.   LOS: 4 days   Mikey Bussing, DNP, AGPCNP-BC, AOCNP 02/01/20   Addendum  I have seen the patient, examined him. I agree with the assessment and and plan and have edited the notes.   Pt appears to be more alert and oriented today, comparing to last Friday.  He has little appetite, ate a small meal when I saw him at noon, no nausea or pain. Still very weak, was only able to walk for a short distance, has been in bed most of time.   Lab reviewed, anemia sable, s/p blood transfusion. AKI improved, but LFTs has not improved much. Not sure if he has liver metastasis from his prostate cancer, I will consider PSMA PET scan after discharge, will hold on liver biopsy for now. If he does have liver mets, he may need chemo, but unfortunately he is not a candidate for chemo at this point due to his infection and poor PS.   We will f/u as needed, and arrange close f/u after discharge. I appreciate the excellent care from our hospitalist and urology teams.   Truitt Merle  02/01/2020

## 2020-02-01 NOTE — Progress Notes (Signed)
Occupational Therapy Treatment Patient Details Name: Frank Daugherty MRN: [id number] DOB: 1946-02-19 Today's Date: 02/01/2020    History of present illness 74yo male presenting from [facility] with septic shock secondary to UTI, acute encephalopathy, and hypotension. Note recente admit at [facility] which revealed metastatis to bone and possibly liver. PMH A-fib with RVR, liver disease with coagulopathy, HTN, chronic anemia   OT comments  OT session focused on self feeding.  RN aware   Follow Up Recommendations  SNF;Supervision/Assistance - 24 hour          Precautions / Restrictions Precautions Precautions: Fall              ADL either performed or assessed with clinical judgement   ADL Overall ADL's : Needs assistance/impaired Eating/Feeding: Minimal assistance;Bed level Eating/Feeding Details (indicate cue type and reason): Pt very sleepy upon OT arrival. Spoke with RN.  OT session focused on self feeding. Pt overall min A but needed MAX VC.  Pt with decreased initiation.                                   General ADL Comments: decreased initiation with self feeding     Vision Patient Visual Report: No change from baseline            Cognition Arousal/Alertness: Awake/alert Behavior During Therapy: Flat affect Overall Cognitive Status: No family/caregiver present to determine baseline cognitive functioning                                 General Comments: flat affect and decreased initiation                   Pertinent Vitals/ Pain       Pain Assessment: No/denies pain         Frequency  Min 2X/week        Progress Toward Goals  OT Goals(current goals can now be found in the care plan section)  Progress towards OT goals: Progressing toward goals     Plan Discharge plan remains appropriate       AM-PAC OT "6 Clicks" Daily Activity     Outcome Measure   Help from another person eating meals?: A Little Help from  another person taking care of personal grooming?: A Little Help from another person toileting, which includes using toliet, bedpan, or urinal?: A Little Help from another person bathing (including washing, rinsing, drying)?: A Lot Help from another person to put on and taking off regular upper body clothing?: A Lot Help from another person to put on and taking off regular lower body clothing?: A Little 6 Click Score: 16    End of Session        Activity Tolerance Patient tolerated treatment well   Patient Left with call bell/phone within reach;in bed;with bed alarm set   Nurse Communication Mobility status        Time: [id number] OT Time Calculation (min): 13 min  Charges: OT General Charges $OT Visit: 1 Visit OT Treatments $Self Care/Home Management : 8-22 mins  Kari Baars, OT Acute Rehabilitation Services [phone] Office- [phone]      Lin Landsman, Edwena Felty D 02/01/2020, 6:49 PM

## 2020-02-01 NOTE — Care Management Important Message (Signed)
Important Message  Patient Details IM Letter given to [name] RN Case Manager to present to the Patient Name: Frank Daugherty MRN: [id number] Date of Birth: [birth year]   Medicare Important Message Given:  Yes     [name] 02/01/2020, 12:00 PM

## 2020-02-02 DIAGNOSIS — C7951 Secondary malignant neoplasm of bone: Secondary | ICD-10-CM

## 2020-02-02 LAB — CBC
HCT: 26.7 % — ABNORMAL LOW (ref 39.0–52.0)
Hemoglobin: 8.7 g/dL — ABNORMAL LOW (ref 13.0–17.0)
MCH: 27.9 pg (ref 26.0–34.0)
MCHC: 32.6 g/dL (ref 30.0–36.0)
MCV: 85.6 fL (ref 80.0–100.0)
Platelets: 63 10*3/uL — ABNORMAL LOW (ref 150–400)
RBC: 3.12 MIL/uL — ABNORMAL LOW (ref 4.22–5.81)
RDW: 20 % — ABNORMAL HIGH (ref 11.5–15.5)
WBC: 13.1 10*3/uL — ABNORMAL HIGH (ref 4.0–10.5)
nRBC: 3.6 % — ABNORMAL HIGH (ref 0.0–0.2)

## 2020-02-02 LAB — COMPREHENSIVE METABOLIC PANEL
ALT: 85 U/L — ABNORMAL HIGH (ref 0–44)
AST: 82 U/L — ABNORMAL HIGH (ref 15–41)
Albumin: 1.6 g/dL — ABNORMAL LOW (ref 3.5–5.0)
Alkaline Phosphatase: 720 U/L — ABNORMAL HIGH (ref 38–126)
Anion gap: 9 (ref 5–15)
BUN: 24 mg/dL — ABNORMAL HIGH (ref 8–23)
CO2: 18 mmol/L — ABNORMAL LOW (ref 22–32)
Calcium: 7.3 mg/dL — ABNORMAL LOW (ref 8.9–10.3)
Chloride: 114 mmol/L — ABNORMAL HIGH (ref 98–111)
Creatinine, Ser: 1.13 mg/dL (ref 0.61–1.24)
GFR calc Af Amer: >60 mL/min (ref >60)
GFR calc non Af Amer: >60 mL/min (ref >60)
Glucose, Bld: 139 mg/dL — ABNORMAL HIGH (ref 70–99)
Potassium: 4 mmol/L (ref 3.5–5.1)
Sodium: 141 mmol/L (ref 135–145)
Total Bilirubin: 1.7 mg/dL — ABNORMAL HIGH (ref 0.3–1.2)
Total Protein: 5 g/dL — ABNORMAL LOW (ref 6.5–8.1)

## 2020-02-02 LAB — MAGNESIUM: Magnesium: 1.9 mg/dL (ref 1.7–2.4)

## 2020-02-02 MED ORDER — SODIUM CHLORIDE 0.9 % IV SOLN
3.0000 g | Freq: Four times a day (QID) | INTRAVENOUS | Status: AC
  Administered 2020-02-02 – 2020-02-05 (×11): 3 g via INTRAVENOUS
  Filled 2020-02-02 (×2): qty 3
  Filled 2020-02-02: qty 8
  Filled 2020-02-02 (×8): qty 3

## 2020-02-02 NOTE — Progress Notes (Signed)
Physical Therapy Treatment Patient Details Name: Frank Daugherty MRN: [id number] DOB: [birth year] Today's Date: 02/02/2020    History of Present Illness 74yo male presenting from [facility] with septic shock secondary to UTI, acute encephalopathy, and hypotension. 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 PMH A-fib with RVR, liver disease with coagulopathy, HTN, chronic anemia    PT Comments    Pt performed exercises in supine and declined mobility today.  Pt repositioned end of session for eating breakfast.  Plan per chart is for return to SNF with palliative following.  Follow Up Recommendations  SNF;Supervision/Assistance - 24 hour     Equipment Recommendations  Rolling walker with 5" wheels;3in1 (PT)    Recommendations for Other Services       Precautions / Restrictions Precautions Precautions: Fall Precaution Comments: bone mets    Mobility  Bed Mobility               General bed mobility comments: pt not agreeable to attempt today, assisted with repositioning with RN end of session so pt could work on eating breakfast  Transfers                    Ambulation/Gait                 Stairs             Wheelchair Mobility    Modified Rankin (Stroke Patients Only)       Balance                                            Cognition Arousal/Alertness: Awake/alert Behavior During Therapy: Flat affect Overall Cognitive Status: No family/caregiver present to determine baseline cognitive functioning                                 General Comments: flat affect and decreased initiation      Exercises General Exercises - Lower Extremity Ankle Circles/Pumps: AROM;Both;10 reps Heel Slides: AAROM;Both;10 reps Hip ABduction/ADduction: AAROM;Both;10 reps Straight Leg Raises: AAROM;Both;10 reps Other Exercises Other Exercises: Performed reaching "high five" from supine  x10 each UE    General Comments        Pertinent Vitals/Pain Pain Assessment: No/denies pain    Home Living                      Prior Function            PT Goals (current goals can now be found in the care plan section) Progress towards PT goals: Progressing toward goals    Frequency    Min 2X/week      PT Plan Current plan remains appropriate    Co-evaluation              AM-PAC PT "6 Clicks" Mobility   Outcome Measure  Help needed turning from your back to your side while in a flat bed without using bedrails?: A Little Help needed moving from lying on your back to sitting on the side of a flat bed without using bedrails?: A Lot Help needed moving to and from a bed to a chair (including a wheelchair)?: A Lot Help needed standing up from a chair using your arms (e.g., wheelchair or bedside chair)?: A Lot  Help needed to walk in hospital room?: A Lot Help needed climbing 3-5 steps with a railing? : Total 6 Click Score: 12    End of Session   Activity Tolerance: Patient tolerated treatment well Patient left: in bed;with call bell/phone within reach;with bed alarm set Nurse Communication: Mobility status PT Visit Diagnosis: Muscle weakness (generalized) (M62.81)     Time: 1046-1100 PT Time Calculation (min) (ACUTE ONLY): 14 min  Charges:  $Therapeutic Exercise: 8-22 mins                    Arlyce Dice, DPT Acute Rehabilitation Services Office: [phone]   Trena Platt 02/02/2020, 3:18 PM

## 2020-02-02 NOTE — Progress Notes (Signed)
Pharmacy Antibiotic Note  Frank Daugherty is a 74 y.o. male admitted on 01/28/2020 with UTI.  Pharmacy has been consulted for cefepime dosing.  Received Vancomycin x1 2/18  2/19: BCID resulted 4/4 bottles with E coli, also Enterococcus Tmax 101.5, WBC 11 > 7.8, SCr 2.42 > 2.14 ( SCr 01/13/2020 was 0.8)  2/21:  E.coli sensitive to Ampicillin/Sulbactam.  Zosyn d/c'ed and pharmacy consulted to dose Unasyn.  2/23: Scr improved to 1.13, CrCl ~ 47ml/min  Plan: Increase Unasyn 3gm IV to q6h Follow renal function  Height: 5\' 11"  (180.3 cm) Weight: 169 lb 12.1 oz (77 kg) IBW/kg (Calculated) : 75.3  Temp (24hrs), Avg:98.8 F (37.1 C), Min:98.3 F (36.8 C), Max:99.7 F (37.6 C)  Recent Labs  Lab 01/28/20 1716 01/28/20 1716 01/28/20 1941 01/29/20 0100 01/29/20 0718 01/30/20 0751 01/30/20 0800 01/31/20 0454 02/01/20 0436 02/02/20 0436  WBC 11.0*   < >  --   --  7.8  --  8.0 8.9 10.9* 13.1*  CREATININE 2.42*   < >  --   --  2.14*  --  1.66* 1.43* 1.36* 1.13  LATICACIDVEN 5.8*  --  5.8* 3.5*  --  1.3  --   --   --   --    < > = values in this interval not displayed.    Estimated Creatinine Clearance: 61.1 mL/min (by C-G formula based on SCr of 1.13 mg/dL).    No Active Allergies  Antimicrobials this admission: 2/18 cefepime >> 2/19 2/18 vancomycin >> 2/19 2/19 Zosyn >> 2/21 2/21 Unasyn >>  Dose adjustments this admission: 2/19 Cefepime 2gm q24 >> q12 with improving renal fx  Microbiology results: 2/18 MRSA PCR: neg 2/18 UCx: sent 2/18 BCx: 4/4 E coli, Enterococcus (no Vanc resis) Thank you for allowing pharmacy to be a part of this patient's care.  Dolly Rias RPh 02/02/2020, 8:22 AM

## 2020-02-02 NOTE — Plan of Care (Signed)
  Problem: Education: Goal: Knowledge of General Education information will improve Description: Including pain rating scale, medication(s)/side effects and non-pharmacologic comfort measures Outcome: Progressing   Problem: Health Behavior/Discharge Planning: Goal: Ability to manage health-related needs will improve Outcome: Progressing   Problem: Clinical Measurements: Goal: Ability to maintain clinical measurements within normal limits will improve Outcome: Progressing Goal: Will remain free from infection Outcome: Progressing Goal: Diagnostic test results will improve Outcome: Progressing Goal: Respiratory complications will improve Outcome: Completed/Met Goal: Cardiovascular complication will be avoided Outcome: Progressing   Problem: Activity: Goal: Risk for activity intolerance will decrease Outcome: Progressing   Problem: Nutrition: Goal: Adequate nutrition will be maintained Outcome: Progressing   Problem: Safety: Goal: Ability to remain free from injury will improve Outcome: Progressing   Problem: Fluid Volume: Goal: Hemodynamic stability will improve Outcome: Progressing   Problem: Clinical Measurements: Goal: Diagnostic test results will improve Outcome: Progressing Goal: Signs and symptoms of infection will decrease Outcome: Progressing   Problem: Respiratory: Goal: Ability to maintain adequate ventilation will improve Outcome: Progressing

## 2020-02-02 NOTE — Progress Notes (Signed)
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  In the ED, temperature one 1.5, HR 111, BP 82/68, RR 21, SPO2 94% on room air.  Creatinine 2.42 (was 0.8 on 01/13/2020), calcium 5.5 corrected for albumin of 1.827.3.  Alkaline phosphatase 870, AST 235, ALT 132, total bilirubin 1.4, WBC count 11, hemoglobin 6.7, platelets 135, INR 2.1, urinalysis with large leukoesterase, many bacteria, negative nitrite, greater than 50 WBCs.  Chest x-ray with no acute cardiopulmonary disease process.  Patient was given 2.5 L NS bolus, started on cefepime and vancomycin and 1 unit PRBC ordered for transfusion.  Patient referred for admission for septic shock secondary to urinary source.   Assessment & Plan:   Principal Problem:   Severe sepsis with septic shock (HCC) Active Problems:   Coagulopathy (HCC)   Acute lower UTI   Elevated lactic acid level   Acute on chronic anemia   Acute metabolic encephalopathy   AKI (acute kidney injury) ([place])   Hypocalcemia   Transaminitis   Palliative care by specialist   Goals of care, counseling/discussion   General weakness   Prostate cancer metastatic to bone ([place])   E coli bacteremia   Septic shock; present on admission E. coli, Enterococcus septicemia Lactic acidosis Patient presenting from SNF with 2-day history of worsening mental status, fever, and weakness.  Patient was febrile up to 1-1.5 with a blood pressure of 73/36 on admission.  Elevated lactic acid of 5.8 with underlying AKI and likely shock liver.  Blood cultures positive for Enterococcus and E. coli.  Received 2.5 L NS  bolus in ED. Foley catheter exchanged --Infectious disease following, appreciate assistance --WBC 11.0-->7.8-->8.0-->8.9-->10.9-->13.1 --Lactic acid 5.8-->3.5-->1.3 --Vancomycin/cefepime-->Zosyn 2/19-->Unasyn 2/21 --Repeat Blood cultures 01/30/2020: no growth x 3 days --Continue supportive care, antiemetics, antipyretics --Continue close monitoring of blood pressure; 122/80 this morning --Follow CBC daily, repeat lactic acid in a.m.  Elevated LFTs likely secondary to shock liver Elevated bilirubin Patient was noted to have an elevated AST of 235 and ALT of 132 on presentation with a total bilirubin of 1.4.  Etiology likely secondary to shock liver with significant hypotension in setting of sepsis as above.  Acute hepatitis panel negative. --AST 235-->217-->129-->96-->85-->82 --ALT 132-->139-->107-->96-->89-->85 --Tbili 1.4-->3.2-->2.5-->3.1-->2.9-->1.7 --Continue to trend CMP daily --Continue IV antibiotics and supportive care as above  Acute renal failure Creatinine 2.42 on presentation. FeNA 0.1%, consistent with prerenal.  Also suspect possible some ATN secondary to septic shock with hypotension as above.  Complicated by chronic indwelling Foley catheter with obstructive uropathy.  Foley catheter exchanged on 01/29/2020. --Cr 2.42-->2.14-->1.66-->1.43-->1.36-->1.13 --Avoid nephrotoxins, renally dose all medications --Continue Foley catheter, will remain in place on discharge until follows up with urology outpatient for consideration of channel TURP --Follow renal function daily  Paroxysmal atrial fibrillation CHA2DS2-VASc score of 2.  Given history of coagulopathy, risks versus benefits discussions were performed during previous hospitalization with decision to refrain from anticoagulation.  On Toprol-XL 100 mg p.o. daily at home. --Currently in NSR, continue monitor on telemetry --increase metoprolol tartrate to 50 mg p.o. BID with holding parameters  Acute on chronic  anemia Baseline hemoglobin 8-10.  Etiology likely secondary to multifactorial with hypoproliferative in  the setting of metastatic disease to the bone in addition to hepatic insufficiency.  Hemoglobin on presentation 6.7, most recent prior value of 8.5 on 01/14/2020.  No obvious signs of blood loss/bleeding.  Iron level 22, TIBC 166, ferritin greater than 7500.  Etiology likely secondary to malignancy with bony metastasis with likely marrow infiltration. --s/p 1u pRBC on 2/18 --Hgb 6.7-->10.4-->8.5-->8.6-->8.6 --continue to monitor hemoglobin closely, transfuse for hemoglobin <8.0 or active bleeding  Coagulopathy INR elevated 2.2, likely secondary to suspected decreased hepatic synthetic function due to metastatic disease.  No signs of active bleeding. --INR 2.2-->1.8-->1.7-->1.6 --Continue to monitor INR and CBC daily  Hypocalcemia Calcium 7.2 with albumin of 1.4, corrected for hypoalbuminemia, Ca = 9.3.  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 Burr Medico.  PSA 1,101.  MR pelvis with IV contrast notable for enhancing tumor mid to apex prostate with invasion of bladder wall consistent with likely prostate primary.  CT chest with no obvious lung mass but with bilateral pleural effusions and diffuse sclerotic osseous metastasis.  --Medical oncology, urology, palliative care following, appreciate assistance --Started on Casodex and Lupron --awaiting for abiraterone from pharmacy per oncology --continue foley in place per urology with plan possible channel TURP to relieve bladder outlet obstruction outpatient  Weakness, debility: --PT/OT recommend return to SNF on discharge; continue therapy efforts while inpatient.  Adult failure to thrive Severe protein calorie malnutrition Body mass index is 23.68 kg/m. Nutrition Status: Nutrition Problem: Increased nutrient needs Etiology: chronic illness, cancer and cancer related  treatments, acute illness(sepsis/septic shock) Signs/Symptoms: estimated needs Interventions: Boost Breeze, Ensure Enlive (each supplement provides 350kcal and 20 grams of protein), MVI    DVT prophylaxis: SCDs Code Status: Partial code, okay for CPR, no intubation Family Communication: Updated patient at bedside, updated patient's son, [name] by telephone yesterday Disposition Plan: Continue inpatient, ultimately poor prognosis given metastatic disease from likely primary prostate cancer; barriers to discharge include continues on IV antibiotics per ID, and awaiting abiraterone from pharmacy to start therapy for underlying prostate cancer, plan to return to [facility] on discharge.   Consultants:   Medical oncology - Dr. Burr Medico  Infectious disease - Dr. Johnnye Sima  Urology - Dr. Alinda Money  Palliative care - Dr. Rowe Pavy  Procedures:   None  Antimicrobials:   Vancomycin 2/18 - 2/19  Cefepime 2/18 - 2/19  Zosyn 2/19 - 2/21  Unasyn 2/21>>   Subjective: Patient seen and examined bedside, lying in bed.  No specific complaints this morning.  Continues with poor appetite, tryed to eat more yesterday.  Is drinking fluids and his Ensure/boost supplement.  Pain controlled.  Denies headache, no chest pain, no shortness of breath, no abdominal pain.  No acute events overnight per nursing staff.  Objective: Vitals:   02/01/20 0507 02/01/20 1432 02/01/20 2034 02/02/20 0431  BP: 124/89 124/86 (!) 125/103 122/80  Pulse: (!) 102 100 100 98  Resp:  (!) 25 17 17   Temp: 99.3 F (37.4 C) 99.7 F (37.6 C) 98.3 F (36.8 C) 98.4 F (36.9 C)  TempSrc: Oral Oral Oral   SpO2: 94% 96% 97% 98%  Weight:      Height:        Intake/Output Summary (Last 24 hours) at 02/02/2020 1006 Last data filed at 02/02/2020 0949 Gross per 24 hour  Intake 1087.29 ml  Output 1200 ml  Net -112.71 ml   Filed Weights   01/29/20 0000 01/31/20 0500 02/01/20 0500  Weight: 70.4 kg 67.8 kg  77 kg     Examination:  General exam: Appears calm and comfortable, thin/cachectic in appearance Respiratory system: Clear to auscultation. Respiratory effort normal.  Oxygenating well on room air Cardiovascular system: S1 & S2 heard, RRR. No JVD, murmurs, rubs, gallops or clicks. No pedal edema. Gastrointestinal system: Abdomen is nondistended, soft and nontender. No organomegaly or masses felt. Normal bowel sounds heard. GU: Foley catheter noted in place draining clear yellow urine Central nervous system: Alert and oriented. No focal neurological deficits. Extremities: Symmetric 5 x 5 power. Skin: No rashes, lesions or ulcers Psychiatry: Judgement and insight appear poor. Mood & affect appropriate.     Data Reviewed: I have personally reviewed following labs and imaging studies  CBC: Recent Labs  Lab 01/28/20 1716 01/28/20 1716 01/29/20 0718 01/30/20 0800 01/31/20 0454 02/01/20 0436 02/02/20 0436  WBC 11.0*   < > 7.8 8.0 8.9 10.9* 13.1*  NEUTROABS 8.4*  --  6.0  --   --   --   --   HGB 6.7*   < > 10.4* 8.5* 8.6* 8.6* 8.7*  HCT 21.0*   < > 32.4* 24.9* 25.8* 26.1* 26.7*  MCV 84.3   < > 84.8 81.9 83.0 83.7 85.6  PLT 135*   < > 122* 84* 77* 71* 63*   < > = values in this interval not displayed.   Basic Metabolic Panel: Recent Labs  Lab 01/29/20 0718 01/30/20 0800 01/31/20 0454 02/01/20 0436 02/02/20 0436  NA 140 142 143 143 141  K 4.3 3.5 3.5 3.4* 4.0  CL 110 115* 115* 116* 114*  CO2 14* 16* 19* 18* 18*  GLUCOSE 101* 118* 113* 131* 139*  BUN 41* 38* 32* 28* 24*  CREATININE 2.14* 1.66* 1.43* 1.36* 1.13  CALCIUM 6.0* 6.1* 6.8* 7.2* 7.3*  MG 1.9 1.8 2.0 2.0 1.9   GFR: Estimated Creatinine Clearance: 61.1 mL/min (by C-G formula based on SCr of 1.13 mg/dL). Liver Function Tests: Recent Labs  Lab 01/29/20 0718 01/30/20 0800 01/31/20 0454 02/01/20 0436 02/02/20 0436  AST 217* 129* 96* 85* 82*  ALT 139* 107* 96* 89* 85*  ALKPHOS 979* 763* 752* 746* 720*  BILITOT  3.2* 2.5* 3.1* 2.9* 1.7*  PROT 6.5 5.1* 5.0* 5.2* 5.0*  ALBUMIN 2.1* 1.5* 1.4* 1.4* 1.6*   No results for input(s): LIPASE, AMYLASE in the last 168 hours. Recent Labs  Lab 01/29/20 0100  AMMONIA 15   Coagulation Profile: Recent Labs  Lab 01/28/20 1716 01/29/20 0100 01/30/20 0800 01/31/20 0454 02/01/20 0436  INR 2.1* 2.2* 1.8* 1.7* 1.6*   Cardiac Enzymes: No results for input(s): CKTOTAL, CKMB, CKMBINDEX, TROPONINI in the last 168 hours. BNP (last 3 results) No results for input(s): PROBNP in the last 8760 hours. HbA1C: No results for input(s): HGBA1C in the last 72 hours. CBG: Recent Labs  Lab 01/29/20 2344  GLUCAP 123*   Lipid Profile: No results for input(s): CHOL, HDL, LDLCALC, TRIG, CHOLHDL, LDLDIRECT in the last 72 hours. Thyroid Function Tests: No results for input(s): TSH, T4TOTAL, FREET4, T3FREE, THYROIDAB in the last 72 hours. Anemia Panel: No results for input(s): VITAMINB12, FOLATE, FERRITIN, TIBC, IRON, RETICCTPCT in the last 72 hours. Sepsis Labs: Recent Labs  Lab 01/28/20 1716 01/28/20 1941 01/29/20 0100 01/30/20 0751  LATICACIDVEN 5.8* 5.8* 3.5* 1.3    Recent Results (from the past 240 hour(s))  Blood Culture (routine x 2)     Status: Abnormal   Collection Time: 01/28/20  5:08 PM   Specimen: BLOOD  Result Value  Ref Range Status   Specimen Description   Final    BLOOD RIGHT ANTECUBITAL Performed at [facility] [address], [place], [place] [address]    Special Requests   Final    BOTTLES DRAWN AEROBIC AND ANAEROBIC Blood Culture adequate volume Performed at [facility] [address] [place], [place] [address]    Culture  Setup Time   Final    GRAM NEGATIVE RODS IN BOTH AEROBIC AND ANAEROBIC BOTTLES CRITICAL RESULT CALLED TO, READ BACK BY AND VERIFIED WITH: PHARMD MARY @0829  01/29/20 AKT    Culture (A)  Final    ESCHERICHIA COLI SUSCEPTIBILITIES PERFORMED ON PREVIOUS CULTURE WITHIN THE LAST  5 DAYS. Performed at [facility], [place] [address] [place], [place] [address]    Report Status 02/01/2020 FINAL  Final  Blood Culture ID Panel (Reflexed)     Status: Abnormal   Collection Time: 01/28/20  5:08 PM  Result Value Ref Range Status   Enterococcus species DETECTED (A) NOT DETECTED Corrected    Comment: CRITICAL RESULT CALLED TO, READ BACK BY AND VERIFIED WITH: PHARMD MARY @0829  01/29/20 AKT CORRECTED ON 02/19 AT 0936: PREVIOUSLY REPORTED AS CRITICAL RESULT CALLED TO, READ BACK BY AND VERIFIED WITH: PHARMD MARY @0829  01/29/20 AKT    Vancomycin resistance NOT DETECTED NOT DETECTED Final   Listeria monocytogenes NOT DETECTED NOT DETECTED Final   Staphylococcus species NOT DETECTED NOT DETECTED Final   Staphylococcus aureus (BCID) NOT DETECTED NOT DETECTED Final   Streptococcus species NOT DETECTED NOT DETECTED Final   Streptococcus agalactiae NOT DETECTED NOT DETECTED Final   Streptococcus pneumoniae NOT DETECTED NOT DETECTED Final   Streptococcus pyogenes NOT DETECTED NOT DETECTED Final   Acinetobacter baumannii NOT DETECTED NOT DETECTED Final   Enterobacteriaceae species DETECTED (A) NOT DETECTED Final    Comment: Enterobacteriaceae represent a large family of gram-negative bacteria, not a single organism. CRITICAL RESULT CALLED TO, READ BACK BY AND VERIFIED WITH: PHARMD MARY @0829  01/29/20 AKT    Enterobacter cloacae complex NOT DETECTED NOT DETECTED Final   Escherichia coli DETECTED (A) NOT DETECTED Final    Comment: CRITICAL RESULT CALLED TO, READ BACK BY AND VERIFIED WITH: PHARMD MARY @0829  01/29/20 AKT    Klebsiella oxytoca NOT DETECTED NOT DETECTED Final   Klebsiella pneumoniae NOT DETECTED NOT DETECTED Final   Proteus species NOT DETECTED NOT DETECTED Final   Serratia marcescens NOT DETECTED NOT DETECTED Final   Carbapenem resistance NOT DETECTED NOT DETECTED Final   Haemophilus influenzae NOT DETECTED NOT DETECTED Final   Neisseria meningitidis NOT DETECTED NOT  DETECTED Final   Pseudomonas aeruginosa NOT DETECTED NOT DETECTED Final   Candida albicans NOT DETECTED NOT DETECTED Final   Candida glabrata NOT DETECTED NOT DETECTED Final   Candida krusei NOT DETECTED NOT DETECTED Final   Candida parapsilosis NOT DETECTED NOT DETECTED Final   Candida tropicalis NOT DETECTED NOT DETECTED Final    Comment: Performed at [facility], [place] [address] [place], [place] [address]  Blood Culture (routine x 2)     Status: Abnormal   Collection Time: 01/28/20  5:16 PM   Specimen: BLOOD LEFT HAND  Result Value Ref Range Status   Specimen Description   Final    BLOOD LEFT HAND Performed at [facility] [address] [place], [place] [address]    Special Requests   Final    BOTTLES DRAWN AEROBIC AND ANAEROBIC Blood Culture adequate volume Performed at [facility]  [place] [address] [place], [place] [address]    Culture  Setup Time   Final    GRAM NEGATIVE RODS IN BOTH AEROBIC AND ANAEROBIC BOTTLES CRITICAL VALUE NOTED.  VALUE IS CONSISTENT WITH PREVIOUSLY REPORTED AND CALLED VALUE. Performed at [facility], [place] [address], [place], [place] [address]    Culture ESCHERICHIA COLI (A)  Final   Report Status 02/01/2020 FINAL  Final   Organism ID, Bacteria ESCHERICHIA COLI  Final      Susceptibility   Escherichia coli - MIC*    AMPICILLIN 16 INTERMEDIATE Intermediate     CEFAZOLIN <=4 SENSITIVE Sensitive     CEFEPIME <=0.12 SENSITIVE Sensitive     CEFTAZIDIME <=1 SENSITIVE Sensitive     CEFTRIAXONE <=0.25 SENSITIVE Sensitive     CIPROFLOXACIN <=0.25 SENSITIVE Sensitive     GENTAMICIN <=1 SENSITIVE Sensitive     IMIPENEM <=0.25 SENSITIVE Sensitive     TRIMETH/SULFA <=20 SENSITIVE Sensitive     AMPICILLIN/SULBACTAM <=2 SENSITIVE Sensitive     PIP/TAZO <=4 SENSITIVE Sensitive     * ESCHERICHIA COLI  Urine culture     Status: Abnormal   Collection Time: 01/28/20  5:54 PM   Specimen: In/Out Cath Urine   Result Value Ref Range Status   Specimen Description   Final    IN/OUT CATH URINE Performed at [facility], [place] [address], [place], [place] [address]    Special Requests   Final    NONE Performed at [facility], [place] [address], [place], [place] [address]    Culture MULTIPLE SPECIES PRESENT, SUGGEST RECOLLECTION (A)  Final   Report Status 01/29/2020 FINAL  Final  SARS CORONAVIRUS 2 (TAT 6-24 HRS) Nasopharyngeal Nasopharyngeal Swab     Status: None   Collection Time: 01/28/20  8:54 PM   Specimen: Nasopharyngeal Swab  Result Value Ref Range Status   SARS Coronavirus 2 NEGATIVE NEGATIVE Final    Comment: (NOTE) SARS-CoV-2 target nucleic acids are NOT DETECTED. The SARS-CoV-2 RNA is generally detectable in upper and lower respiratory specimens during the acute phase of infection. Negative results do not preclude SARS-CoV-2 infection, do not rule out co-infections with other pathogens, and should not be used as the sole basis for treatment or other patient management decisions. Negative results must be combined with clinical observations, patient history, and epidemiological information. The expected result is Negative. Fact Sheet for Patients: SugarRoll.be Fact Sheet for Healthcare Providers: https://www.woods-mathews.com/ This test is not yet approved or cleared by the [place] FDA and  has been authorized for detection and/or diagnosis of SARS-CoV-2 by FDA under an Emergency Use Authorization (EUA). This EUA will remain  in effect (meaning this test can be used) for the duration of the COVID-19 declaration under Section 56 4(b)(1) of the Act, 21 U.S.C. section 360bbb-3(b)(1), unless the authorization is terminated or revoked sooner. Performed at [facility], [place] [address], [place], [place] [address]   MRSA PCR Screening     Status: None   Collection Time: 01/28/20 11:55 PM    Specimen: Nasal Mucosa; Nasopharyngeal  Result Value Ref Range Status   MRSA by PCR NEGATIVE NEGATIVE Final    Comment:        The GeneXpert MRSA Assay (FDA approved for NASAL specimens only), is one component of a comprehensive MRSA colonization surveillance program. It is not intended to diagnose MRSA infection nor to guide or monitor treatment for MRSA infections. Performed at [facility], [place] [address] [place], [place] [address]   Culture, blood (Routine X 2) w Reflex to ID Panel     Status: None (Preliminary result)   Collection Time: 01/30/20  8:00 AM   Specimen: BLOOD  Result Value Ref Range Status   Specimen Description   Final    BLOOD RIGHT ARM Performed at [facility] [address] [place], [place] [address]    Special Requests   Final    BOTTLES DRAWN AEROBIC AND ANAEROBIC Blood Culture adequate volume Performed at [facility] [address], [place], [place] [address]    Culture   Final    NO GROWTH 3 DAYS Performed at [facility], [place] [address] [place], [place] [address]    Report Status PENDING  Incomplete         Radiology Studies: No results found.      Scheduled Meds: . bicalutamide  50 mg Oral Daily  . Chlorhexidine Gluconate Cloth  6 each Topical Daily  . feeding supplement  1 Container Oral BID BM  . feeding supplement (ENSURE ENLIVE)  237 mL Oral BID BM  . mouth rinse  15 mL Mouth Rinse BID  . metoprolol tartrate  50 mg Oral BID  . multivitamin with minerals  1 tablet Oral Daily  . sodium chloride flush  3 mL Intravenous Q12H   Continuous Infusions: . ampicillin-sulbactam (UNASYN) IV       LOS: 5 days    Time spent: 32 minutes spent on chart review, discussion with nursing staff, consultants, updating family and interview/physical exam; more than 50% of that time was spent in counseling and/or coordination of care.    Keanan Melander J [place] DO [facility] Available via Epic secure chat 7am-7pm After these hours, please refer to coverage provider listed on [email or web address] 02/02/2020, 10:06 AM

## 2020-02-02 NOTE — Progress Notes (Signed)
Daily Progress Note   Patient Name: Frank Daugherty       Date: 02/02/2020 DOB: 1946-06-04  Age: 74 y.o. MRN#: [id number] Attending Physician: [place] Eric J, DO Primary Care Physician: Patient, No Pcp Per Admit Date: 01/28/2020  Reason for Consultation/Follow-up: Establishing goals of care  Subjective: Awake, alert, no distress.  Able to answer simple questions but slow in response.  Denies complaints other than wishing he could eat more.  Discussed clinical course as well as plan moving forward.  He also asked me to reach out to his son, Frank Daugherty.  Length of Stay: 5  Current Medications: Scheduled Meds:  . bicalutamide  50 mg Oral Daily  . Chlorhexidine Gluconate Cloth  6 each Topical Daily  . feeding supplement  1 Container Oral BID BM  . feeding supplement (ENSURE ENLIVE)  237 mL Oral BID BM  . mouth rinse  15 mL Mouth Rinse BID  . metoprolol tartrate  50 mg Oral BID  . multivitamin with minerals  1 tablet Oral Daily  . sodium chloride flush  3 mL Intravenous Q12H    Continuous Infusions: . ampicillin-sulbactam (UNASYN) IV      PRN Meds: acetaminophen **OR** acetaminophen  Physical Exam         Appears weak No distress acutely Regular work of breathing S1 S2 Abdomen is not distended Has foley No edema  Vital Signs: BP 122/80 (BP Location: Left Arm)   Pulse 98   Temp 98.4 F (36.9 C)   Resp 17   Ht 5\' 11"  (1.803 m)   Wt 77 kg   SpO2 98%   BMI 23.68 kg/m  SpO2: SpO2: 98 % O2 Device: O2 Device: Room Air O2 Flow Rate: O2 Flow Rate (L/min): 2 L/min  Intake/output summary:   Intake/Output Summary (Last 24 hours) at 02/02/2020 1032 Last data filed at 02/02/2020 [id number] Gross per 24 hour  Intake 1087.29 ml  Output 1200 ml  Net -112.71 ml   LBM: Last BM Date:  02/02/20 Baseline Weight: Weight: 71.7 kg Most recent weight: Weight: 77 kg       Palliative Assessment/Data:      Patient Active Problem List   Diagnosis Date Noted  . Palliative care by specialist   . Goals of care, counseling/discussion   . General weakness   . Prostate cancer  metastatic to bone ([place])   . E coli bacteremia   . Severe sepsis with septic shock ([place]) 01/28/2020  . Acute lower UTI 01/28/2020  . Elevated lactic acid level 01/28/2020  . Acute on chronic anemia 01/28/2020  . Acute metabolic encephalopathy 99991111  . AKI (acute kidney injury) ([place]) 01/28/2020  . Hypocalcemia 01/28/2020  . Transaminitis 01/28/2020  . Metastatic carcinoma to bone ([place]) 01/20/2020  . Abnormal liver function tests   . Abnormal serum level of alkaline phosphatase   . Abnormal MRI of abdomen   . Atrial fibrillation with RVR ([place]) 01/05/2020  . Renal insufficiency 01/05/2020  . Coagulopathy ([place]) 01/05/2020  . Hyperglycemia 01/05/2020  . HTN (hypertension) 04/23/2012  . HLD (hyperlipidemia) 04/23/2012    Palliative Care Assessment & Plan   Patient Profile:    Assessment:  metastatic cancer to bone liver bladder, likely prostate primary Functional decline Failure to thrive, severe protein calorie malnutrition Sepsis with E coli and enterococcus in blood cultures Cancer related anorexia and cachexia  Recommendations/Plan: - ID and oncology following, continue current mode of care, [facility] MD note reviewed.  - Discussed about code status and broad goals of care with patient and his son via phone.  Only limit of care is no intubation or mechanical ventilation.  SNF rehab with palliative care is recommended.  - I reviewed concept of MOST form with his son via phone and discussed how to develop plan of care to focus on continuing therapies that would maximize chance of being well enough to enjoy time outside of the hospital and limiting therapies not in line with this goal.    - I  left copy of MOST form and Hard Choices for [employer] for his son to review.  Recommended they complete a MOST form at time of outpatient care follow-up (or prior to discharge if they have opportunity to discuss prior to him leaving).  Code Status: - Partial  Prognosis:   guarded. ?6-12 months.   Discharge Planning:  [facility] for rehab with Palliative care service follow-up  Care plan was discussed with  Patient, call placed and also discussed with son Frank Daugherty as well.   Thank you for allowing the Palliative Medicine Team to assist in the care of this patient.   Time In: 10 Time Out: 10.35 Total Time 35 Prolonged Time Billed No       Greater than 50%  of this time was spent counseling and coordinating care related to the above assessment and plan.  Micheline Rough, MD  Please contact Palliative Medicine Team phone at [phone] for questions and concerns.

## 2020-02-02 NOTE — Progress Notes (Signed)
INFECTIOUS DISEASE PROGRESS NOTE  ID: Bolton Householder is a 74 y.o. male with  Principal Problem:   Severe sepsis with septic shock ([place]) Active Problems:   Coagulopathy ([place])   Acute lower UTI   Elevated lactic acid level   Acute on chronic anemia   Acute metabolic encephalopathy   AKI (acute kidney injury) ([place])   Hypocalcemia   Transaminitis   Palliative care by specialist   Goals of care, counseling/discussion   General weakness   Prostate cancer metastatic to bone ([place])   E coli bacteremia  Subjective: No complaints, feels better.   Abtx:  Anti-infectives (From admission, onward)   Start     Dose/Rate Route Frequency Ordered Stop   02/02/20 1200  Ampicillin-Sulbactam (UNASYN) 3 g in sodium chloride 0.9 % 100 mL IVPB     3 g 200 mL/hr over 30 Minutes Intravenous Every 6 hours 02/02/20 0820     01/31/20 2200  Ampicillin-Sulbactam (UNASYN) 3 g in sodium chloride 0.9 % 100 mL IVPB  Status:  Discontinued     3 g 200 mL/hr over 30 Minutes Intravenous Every 8 hours 01/31/20 1539 02/02/20 0820   01/30/20 1800  vancomycin (VANCOREADY) IVPB 1500 mg/300 mL  Status:  Discontinued     1,500 mg 150 mL/hr over 120 Minutes Intravenous Every 48 hours 01/29/20 0849 01/29/20 1154   01/29/20 2000  piperacillin-tazobactam (ZOSYN) IVPB 3.375 g  Status:  Discontinued     3.375 g 12.5 mL/hr over 240 Minutes Intravenous Every 8 hours 01/29/20 1208 01/31/20 1539   01/29/20 1600  ceFEPIme (MAXIPIME) 2 g in sodium chloride 0.9 % 100 mL IVPB  Status:  Discontinued     2 g 200 mL/hr over 30 Minutes Intravenous Every 24 hours 01/29/20 0217 01/29/20 0916   01/29/20 1000  ceFEPIme (MAXIPIME) 2 g in sodium chloride 0.9 % 100 mL IVPB  Status:  Discontinued     2 g 200 mL/hr over 30 Minutes Intravenous Every 12 hours 01/29/20 0916 01/29/20 1154   01/28/20 1700  ceFEPIme (MAXIPIME) 2 g in sodium chloride 0.9 % 100 mL IVPB     2 g 200 mL/hr over 30 Minutes Intravenous  Once 01/28/20 1648 01/28/20 1849     01/28/20 1700  vancomycin (VANCOCIN) IVPB 1000 mg/200 mL premix  Status:  Discontinued     1,000 mg 200 mL/hr over 60 Minutes Intravenous  Once 01/28/20 1648 01/28/20 1651   01/28/20 1700  vancomycin (VANCOREADY) IVPB 1500 mg/300 mL     1,500 mg 150 mL/hr over 120 Minutes Intravenous  Once 01/28/20 1651 01/28/20 2024      Medications:  Scheduled: . bicalutamide  50 mg Oral Daily  . Chlorhexidine Gluconate Cloth  6 each Topical Daily  . feeding supplement  1 Container Oral BID BM  . feeding supplement (ENSURE ENLIVE)  237 mL Oral BID BM  . mouth rinse  15 mL Mouth Rinse BID  . metoprolol tartrate  50 mg Oral BID  . multivitamin with minerals  1 tablet Oral Daily  . sodium chloride flush  3 mL Intravenous Q12H    Objective: Vital signs in last 24 hours: Temp:  [98.3 F (36.8 C)-99.7 F (37.6 C)] 98.4 F (36.9 C) (02/23 0431) Pulse Rate:  [98-100] 98 (02/23 0431) Resp:  [17-25] 17 (02/23 0431) BP: (122-125)/(80-103) 122/80 (02/23 0431) SpO2:  [96 %-98 %] 98 % (02/23 0431)   General appearance: alert, cooperative and no distress Resp: clear to auscultation bilaterally Cardio: regularly irregular  rhythm GI: normal findings: bowel sounds normal and soft, non-tender  Lab Results Recent Labs    02/01/20 0436 02/02/20 0436  WBC 10.9* 13.1*  HGB 8.6* 8.7*  HCT 26.1* 26.7*  NA 143 141  K 3.4* 4.0  CL 116* 114*  CO2 18* 18*  BUN 28* 24*  CREATININE 1.36* 1.13   Liver Panel Recent Labs    02/01/20 0436 02/02/20 0436  PROT 5.2* 5.0*  ALBUMIN 1.4* 1.6*  AST 85* 82*  ALT 89* 85*  ALKPHOS 746* 720*  BILITOT 2.9* 1.7*   Sedimentation Rate No results for input(s): ESRSEDRATE in the last 72 hours. C-Reactive Protein No results for input(s): CRP in the last 72 hours.  Microbiology: Recent Results (from the past 240 hour(s))  Blood Culture (routine x 2)     Status: Abnormal   Collection Time: 01/28/20  5:08 PM   Specimen: BLOOD  Result Value Ref Range Status    Specimen Description   Final    BLOOD RIGHT ANTECUBITAL Performed at [facility] [address], [place], [place] [address]    Special Requests   Final    BOTTLES DRAWN AEROBIC AND ANAEROBIC Blood Culture adequate volume Performed at [facility] [address], [place], [place] [address]    Culture  Setup Time   Final    GRAM NEGATIVE RODS IN BOTH AEROBIC AND ANAEROBIC BOTTLES CRITICAL RESULT CALLED TO, READ BACK BY AND VERIFIED WITH: PHARMD MARY @0829  01/29/20 AKT    Culture (A)  Final    ESCHERICHIA COLI SUSCEPTIBILITIES PERFORMED ON PREVIOUS CULTURE WITHIN THE LAST 5 DAYS. Performed at [facility], [place] [address], [place], [place] [address]    Report Status 02/01/2020 FINAL  Final  Blood Culture ID Panel (Reflexed)     Status: Abnormal   Collection Time: 01/28/20  5:08 PM  Result Value Ref Range Status   Enterococcus species DETECTED (A) NOT DETECTED Corrected    Comment: CRITICAL RESULT CALLED TO, READ BACK BY AND VERIFIED WITH: PHARMD MARY @0829  01/29/20 AKT CORRECTED ON 02/19 AT 0936: PREVIOUSLY REPORTED AS CRITICAL RESULT CALLED TO, READ BACK BY AND VERIFIED WITH: PHARMD MARY @0829  01/29/20 AKT    Vancomycin resistance NOT DETECTED NOT DETECTED Final   Listeria monocytogenes NOT DETECTED NOT DETECTED Final   Staphylococcus species NOT DETECTED NOT DETECTED Final   Staphylococcus aureus (BCID) NOT DETECTED NOT DETECTED Final   Streptococcus species NOT DETECTED NOT DETECTED Final   Streptococcus agalactiae NOT DETECTED NOT DETECTED Final   Streptococcus pneumoniae NOT DETECTED NOT DETECTED Final   Streptococcus pyogenes NOT DETECTED NOT DETECTED Final   Acinetobacter baumannii NOT DETECTED NOT DETECTED Final   Enterobacteriaceae species DETECTED (A) NOT DETECTED Final    Comment: Enterobacteriaceae represent a large family of gram-negative bacteria, not a single organism. CRITICAL RESULT CALLED TO, READ BACK BY AND VERIFIED  WITH: PHARMD MARY @0829  01/29/20 AKT    Enterobacter cloacae complex NOT DETECTED NOT DETECTED Final   Escherichia coli DETECTED (A) NOT DETECTED Final    Comment: CRITICAL RESULT CALLED TO, READ BACK BY AND VERIFIED WITH: PHARMD MARY @0829  01/29/20 AKT    Klebsiella oxytoca NOT DETECTED NOT DETECTED Final   Klebsiella pneumoniae NOT DETECTED NOT DETECTED Final   Proteus species NOT DETECTED NOT DETECTED Final   Serratia marcescens NOT DETECTED NOT DETECTED Final   Carbapenem resistance NOT DETECTED NOT DETECTED Final   Haemophilus influenzae NOT DETECTED NOT DETECTED Final   Neisseria meningitidis  NOT DETECTED NOT DETECTED Final   Pseudomonas aeruginosa NOT DETECTED NOT DETECTED Final   Candida albicans NOT DETECTED NOT DETECTED Final   Candida glabrata NOT DETECTED NOT DETECTED Final   Candida krusei NOT DETECTED NOT DETECTED Final   Candida parapsilosis NOT DETECTED NOT DETECTED Final   Candida tropicalis NOT DETECTED NOT DETECTED Final    Comment: Performed at [facility], [place] [address] [place], [place] [address]  Blood Culture (routine x 2)     Status: Abnormal   Collection Time: 01/28/20  5:16 PM   Specimen: BLOOD LEFT HAND  Result Value Ref Range Status   Specimen Description   Final    BLOOD LEFT HAND Performed at [facility] [address] [place], [place] [address]    Special Requests   Final    BOTTLES DRAWN AEROBIC AND ANAEROBIC Blood Culture adequate volume Performed at [facility] [address], [place], [place] [address]    Culture  Setup Time   Final    GRAM NEGATIVE RODS IN BOTH AEROBIC AND ANAEROBIC BOTTLES CRITICAL VALUE NOTED.  VALUE IS CONSISTENT WITH PREVIOUSLY REPORTED AND CALLED VALUE. Performed at [facility], [place] [address], [place], [place] [address]    Culture ESCHERICHIA COLI (A)  Final   Report Status 02/01/2020 FINAL  Final   Organism ID, Bacteria ESCHERICHIA COLI  Final       Susceptibility   Escherichia coli - MIC*    AMPICILLIN 16 INTERMEDIATE Intermediate     CEFAZOLIN <=4 SENSITIVE Sensitive     CEFEPIME <=0.12 SENSITIVE Sensitive     CEFTAZIDIME <=1 SENSITIVE Sensitive     CEFTRIAXONE <=0.25 SENSITIVE Sensitive     CIPROFLOXACIN <=0.25 SENSITIVE Sensitive     GENTAMICIN <=1 SENSITIVE Sensitive     IMIPENEM <=0.25 SENSITIVE Sensitive     TRIMETH/SULFA <=20 SENSITIVE Sensitive     AMPICILLIN/SULBACTAM <=2 SENSITIVE Sensitive     PIP/TAZO <=4 SENSITIVE Sensitive     * ESCHERICHIA COLI  Urine culture     Status: Abnormal   Collection Time: 01/28/20  5:54 PM   Specimen: In/Out Cath Urine  Result Value Ref Range Status   Specimen Description   Final    IN/OUT CATH URINE Performed at [facility], [place] [address] [place], [place] [address]    Special Requests   Final    NONE Performed at [facility], [place] [address], [place],  [address]    Culture MULTIPLE SPECIES PRESENT, SUGGEST RECOLLECTION (A)  Final   Report Status 01/29/2020 FINAL  Final  SARS CORONAVIRUS 2 (TAT 6-24 HRS) Nasopharyngeal Nasopharyngeal Swab     Status: None   Collection Time: 01/28/20  8:54 PM   Specimen: Nasopharyngeal Swab  Result Value Ref Range Status   SARS Coronavirus 2 NEGATIVE NEGATIVE Final    Comment: (NOTE) SARS-CoV-2 target nucleic acids are NOT DETECTED. The SARS-CoV-2 RNA is generally detectable in upper and lower respiratory specimens during the acute phase of infection. Negative results do not preclude SARS-CoV-2 infection, do not rule out co-infections with other pathogens, and should not be used as the sole basis for treatment or other patient management decisions. Negative results must be combined with clinical observations, patient history, and epidemiological information. The expected result is Negative. Fact Sheet for Patients: SugarRoll.be Fact Sheet for Healthcare  Providers: https://www.woods-mathews.com/ This test is not yet approved or cleared by the [place] and  has been authorized  for detection and/or diagnosis of SARS-CoV-2 by FDA under an Emergency Use Authorization (EUA). This EUA will remain  in effect (meaning this test can be used) for the duration of the COVID-19 declaration under Section 56 4(b)(1) of the Act, 21 U.S.C. section 360bbb-3(b)(1), unless the authorization is terminated or revoked sooner. Performed at [facility], [place] [address] [place], [place] [address]   MRSA PCR Screening     Status: None   Collection Time: 01/28/20 11:55 PM   Specimen: Nasal Mucosa; Nasopharyngeal  Result Value Ref Range Status   MRSA by PCR NEGATIVE NEGATIVE Final    Comment:        The GeneXpert MRSA Assay (FDA approved for NASAL specimens only), is one component of a comprehensive MRSA colonization surveillance program. It is not intended to diagnose MRSA infection nor to guide or monitor treatment for MRSA infections. Performed at [facility], [place] [address], [place], [place] [address]   Culture, blood (Routine X 2) w Reflex to ID Panel     Status: None (Preliminary result)   Collection Time: 01/30/20  8:00 AM   Specimen: BLOOD  Result Value Ref Range Status   Specimen Description   Final    BLOOD RIGHT ARM Performed at [facility] [address] [place], [place] [address]    Special Requests   Final    BOTTLES DRAWN AEROBIC AND ANAEROBIC Blood Culture adequate volume Performed at [facility] [address] [place], [place] [address]    Culture   Final    NO GROWTH 3 DAYS Performed at [facility], [place] [address], [place],  [address]    Report Status PENDING  Incomplete    Studies/Results: No results found.   Assessment/Plan: Enterococcal bacteremia E coli bacteremia (4/4 on BCx) Metastatic CA, Prostate Aki  Total  days of antibiotics: 5 (unasyn)  AKI resolved Repeat BCx 2-20 are ngtd.  Would continue unasyn til day 7 (2-25) then give him augmentin po for 7 more days.  Foley removal per uro.  Available as needed.          Bobby Rumpf MD, FACP Infectious Diseases (pager) [phone] [email or web address] 02/02/2020, 9:05 AM  LOS: 5 days

## 2020-02-03 LAB — COMPREHENSIVE METABOLIC PANEL
ALT: 77 U/L — ABNORMAL HIGH (ref 0–44)
AST: 74 U/L — ABNORMAL HIGH (ref 15–41)
Albumin: 1.6 g/dL — ABNORMAL LOW (ref 3.5–5.0)
Alkaline Phosphatase: 683 U/L — ABNORMAL HIGH (ref 38–126)
Anion gap: 11 (ref 5–15)
BUN: 20 mg/dL (ref 8–23)
CO2: 18 mmol/L — ABNORMAL LOW (ref 22–32)
Calcium: 7.4 mg/dL — ABNORMAL LOW (ref 8.9–10.3)
Chloride: 113 mmol/L — ABNORMAL HIGH (ref 98–111)
Creatinine, Ser: 0.99 mg/dL (ref 0.61–1.24)
GFR calc Af Amer: >60 mL/min (ref >60)
GFR calc non Af Amer: >60 mL/min (ref >60)
Glucose, Bld: 134 mg/dL — ABNORMAL HIGH (ref 70–99)
Potassium: 3.7 mmol/L (ref 3.5–5.1)
Sodium: 142 mmol/L (ref 135–145)
Total Bilirubin: 1.5 mg/dL — ABNORMAL HIGH (ref 0.3–1.2)
Total Protein: 5.1 g/dL — ABNORMAL LOW (ref 6.5–8.1)

## 2020-02-03 LAB — CBC
HCT: 27.6 % — ABNORMAL LOW (ref 39.0–52.0)
Hemoglobin: 9.1 g/dL — ABNORMAL LOW (ref 13.0–17.0)
MCH: 28.2 pg (ref 26.0–34.0)
MCHC: 33 g/dL (ref 30.0–36.0)
MCV: 85.4 fL (ref 80.0–100.0)
Platelets: 60 10*3/uL — ABNORMAL LOW (ref 150–400)
RBC: 3.23 MIL/uL — ABNORMAL LOW (ref 4.22–5.81)
RDW: 19.9 % — ABNORMAL HIGH (ref 11.5–15.5)
WBC: 16.8 10*3/uL — ABNORMAL HIGH (ref 4.0–10.5)
nRBC: 3.9 % — ABNORMAL HIGH (ref 0.0–0.2)

## 2020-02-03 MED ORDER — SODIUM CHLORIDE 0.9 % IV BOLUS
1000.0000 mL | Freq: Once | INTRAVENOUS | Status: AC
  Administered 2020-02-03: 1000 mL via INTRAVENOUS

## 2020-02-03 MED ORDER — METOPROLOL TARTRATE 50 MG PO TABS
100.0000 mg | ORAL_TABLET | Freq: Two times a day (BID) | ORAL | Status: DC
  Administered 2020-02-03 – 2020-02-05 (×4): 100 mg via ORAL
  Filled 2020-02-03 (×4): qty 2

## 2020-02-03 MED ORDER — SODIUM CHLORIDE 0.9 % IV SOLN: INTRAVENOUS | Status: DC

## 2020-02-03 NOTE — Progress Notes (Signed)
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  In the ED, temperature one 1.5, HR 111, BP 82/68, RR 21, SPO2 94% on room air.  Creatinine 2.42 (was 0.8 on 01/13/2020), calcium 5.5 corrected for albumin of 1.827.3.  Alkaline phosphatase 870, AST 235, ALT 132, total bilirubin 1.4, WBC count 11, hemoglobin 6.7, platelets 135, INR 2.1, urinalysis with large leukoesterase, many bacteria, negative nitrite, greater than 50 WBCs.  Chest x-ray with no acute cardiopulmonary disease process.  Patient was given 2.5 L NS bolus, started on cefepime and vancomycin and 1 unit PRBC ordered for transfusion.  Patient referred for admission for septic shock secondary to urinary source.   Assessment & Plan:   Principal Problem:   Severe sepsis with septic shock (HCC) Active Problems:   Coagulopathy (HCC)   Acute lower UTI   Elevated lactic acid level   Acute on chronic anemia   Acute metabolic encephalopathy   AKI (acute kidney injury) ([place])   Hypocalcemia   Transaminitis   Palliative care by specialist   Goals of care, counseling/discussion   General weakness   Prostate cancer metastatic to bone ([place])   E coli bacteremia   Septic shock; present on admission E. coli, Enterococcus septicemia Lactic acidosis Patient presenting from SNF with 2-day history of worsening mental status, fever, and weakness.  Patient was febrile up to 1-1.5 with a blood pressure of 73/36 on admission.  Elevated lactic acid of 5.8 with underlying AKI and likely shock liver.  Blood cultures positive for Enterococcus and E. coli.  Received 2.5 L NS  bolus in ED. Foley catheter exchanged --Infectious disease following, appreciate assistance --WBC 11.0-->7.8-->8.0-->8.9-->10.9-->13.1-->16.8 --Lactic acid 5.8-->3.5-->1.3 --Vancomycin/cefepime-->Zosyn 2/19-->Unasyn 2/21, will transition to Augmentin on 2/26 to complete 14 day antibiotic course --Repeat Blood cultures 01/30/2020: no growth x 3 days --Continue supportive care, antiemetics, antipyretics --Continue close monitoring of blood pressure; 122/80 this morning --Follow CBC daily, repeat lactic acid in a.m.  Elevated LFTs likely secondary to shock liver Elevated bilirubin Patient was noted to have an elevated AST of 235 and ALT of 132 on presentation with a total bilirubin of 1.4.  Etiology likely secondary to shock liver with significant hypotension in setting of sepsis as above.  Acute hepatitis panel negative. --AST 235-->217-->129-->96-->85-->82-->74 --ALT 132-->139-->107-->96-->89-->85-->77 --Tbili 1.4-->3.2-->2.5-->3.1-->2.9-->1.7-->1.5 --Continue to trend CMP daily --Continue IV antibiotics and supportive care as above  Acute renal failure Creatinine 2.42 on presentation. FeNA 0.1%, consistent with prerenal.  Also suspect possible some ATN secondary to septic shock with hypotension as above.  Complicated by chronic indwelling Foley catheter with obstructive uropathy.  Foley catheter exchanged on 01/29/2020. --Cr 2.42-->2.14-->1.66-->1.43-->1.36-->1.13-->0.99 --Avoid nephrotoxins, renally dose all medications --Continue Foley catheter, will remain in place on discharge until follows up with urology outpatient for consideration of channel TURP --Follow renal function daily  Paroxysmal atrial fibrillation CHA2DS2-VASc score of 2.  Given history of coagulopathy, risks versus benefits discussions were performed during previous hospitalization with decision to refrain from anticoagulation.  On Toprol-XL 100 mg p.o. daily at home. --Currently in NSR, continue monitor on  telemetry --increase metoprolol tartrate to 100 mg p.o. BID with holding parameters  Acute on chronic anemia  Baseline hemoglobin 8-10.  Etiology likely secondary to multifactorial with hypoproliferative in the setting of metastatic disease to the bone in addition to hepatic insufficiency.  Hemoglobin on presentation 6.7, most recent prior value of 8.5 on 01/14/2020.  No obvious signs of blood loss/bleeding.  Iron level 22, TIBC 166, ferritin greater than 7500.  Etiology likely secondary to malignancy with bony metastasis with likely marrow infiltration. --s/p 1u pRBC on 2/18 --Hgb 6.7-->10.4-->8.5-->8.6-->8.6-->9.1 --continue to monitor hemoglobin closely, transfuse for hemoglobin <8.0 or active bleeding  Coagulopathy INR elevated 2.2, likely secondary to suspected decreased hepatic synthetic function due to metastatic disease.  No signs of active bleeding. --INR 2.2-->1.8-->1.7-->1.6  Hypocalcemia Calcium 7.4 with albumin of 1.6, corrected for hypoalbuminemia, Ca = 9.3.  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 Burr Medico.  PSA 1,101.  MR pelvis with IV contrast notable for enhancing tumor mid to apex prostate with invasion of bladder wall consistent with likely prostate primary.  CT chest with no obvious lung mass but with bilateral pleural effusions and diffuse sclerotic osseous metastasis.  --Medical oncology, urology, palliative care following, appreciate assistance --s/p Lupron --Continue Casodex  --continue foley in place per urology with plan possible channel TURP to relieve bladder outlet obstruction outpatient --outpatient f/u with Oncology, Dr. Burr Medico in 3 weeks following discharge  Weakness, debility: --PT/OT recommend return to SNF on discharge; continue therapy efforts while inpatient.  Adult failure to thrive Severe protein calorie malnutrition Body mass index is 23.26 kg/m. Nutrition Status: Nutrition  Problem: Increased nutrient needs Etiology: chronic illness, cancer and cancer related treatments, acute illness(sepsis/septic shock) Signs/Symptoms: estimated needs Interventions: Boost Breeze, Ensure Enlive (each supplement provides 350kcal and 20 grams of protein), MVI    DVT prophylaxis: SCDs Code Status: Partial code, okay for CPR, no intubation Family Communication: Updated patient at bedside Disposition Plan: Continue inpatient, ultimately poor prognosis given metastatic disease from likely primary prostate cancer; barriers to discharge include continues on IV antibiotics per ID,  plan to [facility] to [facility] on 02/05/2020   Consultants:   Medical oncology - Dr. Burr Medico  Infectious disease - Dr. Johnnye Sima  Urology - Dr. Alinda Money  Palliative care - Dr. Rowe Pavy  Procedures:   None  Antimicrobials:   Vancomycin 2/18 - 2/19  Cefepime 2/18 - 2/19  Zosyn 2/19 - 2/21  Unasyn 2/21>>   Subjective: Patient seen and examined bedside, lying in bed.  No specific complaints this morning.  Very little solid oral intake, does better with fluids.  Continues to be encouraged by nursing staff to eat more.  States just does not have any appetite.  Declines any appetite stimulant.  Pain control.  Denies headache, no chest pain, no shortness of breath, no abdominal pain.  No acute events overnight per nursing staff.  Objective: Vitals:   02/03/20 0420 02/03/20 0500 02/03/20 1010 02/03/20 1011  BP: 128/80   124/74  Pulse: 97  (!) 113 (!) 103  Resp: 18     Temp: 98.2 F (36.8 C)     TempSrc: Oral     SpO2: 96%     Weight:  75.7 kg    Height:        Intake/Output Summary (Last 24 hours) at 02/03/2020 1313 Last data filed at 02/03/2020 1000 Gross per 24 hour  Intake 1273.22 ml  Output 975 ml  Net 298.22 ml   Filed Weights   01/31/20 0500 02/01/20 0500 02/03/20 0500  Weight: 67.8 kg 77 kg 75.7 kg  Examination:  General exam: Appears calm and comfortable, thin/cachectic in  appearance Respiratory system: Clear to auscultation. Respiratory effort normal.  Oxygenating well on room air Cardiovascular system: S1 & S2 heard, RRR. No JVD, murmurs, rubs, gallops or clicks. No pedal edema. Gastrointestinal system: Abdomen is nondistended, soft and nontender. No organomegaly or masses felt. Normal bowel sounds heard. GU: Foley catheter noted in place draining clear yellow urine Central nervous system: Alert and oriented. No focal neurological deficits. Extremities: Symmetric 5 x 5 power. Skin: No rashes, lesions or ulcers Psychiatry: Judgement and insight appear poor. Mood & affect appropriate.     Data Reviewed: I have personally reviewed following labs and imaging studies  CBC: Recent Labs  Lab 01/28/20 1716 01/28/20 1716 01/29/20 0718 01/29/20 0718 01/30/20 0800 01/31/20 0454 02/01/20 0436 02/02/20 0436 02/03/20 0504  WBC 11.0*   < > 7.8   < > 8.0 8.9 10.9* 13.1* 16.8*  NEUTROABS 8.4*  --  6.0  --   --   --   --   --   --   HGB 6.7*   < > 10.4*   < > 8.5* 8.6* 8.6* 8.7* 9.1*  HCT 21.0*   < > 32.4*   < > 24.9* 25.8* 26.1* 26.7* 27.6*  MCV 84.3   < > 84.8   < > 81.9 83.0 83.7 85.6 85.4  PLT 135*   < > 122*   < > 84* 77* 71* 63* 60*   < > = values in this interval not displayed.   Basic Metabolic Panel: Recent Labs  Lab 01/29/20 0718 01/29/20 0718 01/30/20 0800 01/31/20 0454 02/01/20 0436 02/02/20 0436 02/03/20 0504  NA 140   < > 142 143 143 141 142  K 4.3   < > 3.5 3.5 3.4* 4.0 3.7  CL 110   < > 115* 115* 116* 114* 113*  CO2 14*   < > 16* 19* 18* 18* 18*  GLUCOSE 101*   < > 118* 113* 131* 139* 134*  BUN 41*   < > 38* 32* 28* 24* 20  CREATININE 2.14*   < > 1.66* 1.43* 1.36* 1.13 0.99  CALCIUM 6.0*   < > 6.1* 6.8* 7.2* 7.3* 7.4*  MG 1.9  --  1.8 2.0 2.0 1.9  --    < > = values in this interval not displayed.   GFR: Estimated Creatinine Clearance: 69.7 mL/min (by C-G formula based on SCr of 0.99 mg/dL). Liver Function Tests: Recent Labs    Lab 01/30/20 0800 01/31/20 0454 02/01/20 0436 02/02/20 0436 02/03/20 0504  AST 129* 96* 85* 82* 74*  ALT 107* 96* 89* 85* 77*  ALKPHOS 763* 752* 746* 720* 683*  BILITOT 2.5* 3.1* 2.9* 1.7* 1.5*  PROT 5.1* 5.0* 5.2* 5.0* 5.1*  ALBUMIN 1.5* 1.4* 1.4* 1.6* 1.6*   No results for input(s): LIPASE, AMYLASE in the last 168 hours. Recent Labs  Lab 01/29/20 0100  AMMONIA 15   Coagulation Profile: Recent Labs  Lab 01/28/20 1716 01/29/20 0100 01/30/20 0800 01/31/20 0454 02/01/20 0436  INR 2.1* 2.2* 1.8* 1.7* 1.6*   Cardiac Enzymes: No results for input(s): CKTOTAL, CKMB, CKMBINDEX, TROPONINI in the last 168 hours. BNP (last 3 results) No results for input(s): PROBNP in the last 8760 hours. HbA1C: No results for input(s): HGBA1C in the last 72 hours. CBG: Recent Labs  Lab 01/29/20 2344  GLUCAP 123*   Lipid Profile: No results for input(s): CHOL, HDL, LDLCALC, TRIG, CHOLHDL, LDLDIRECT in the last 72  hours. Thyroid Function Tests: No results for input(s): TSH, T4TOTAL, FREET4, T3FREE, THYROIDAB in the last 72 hours. Anemia Panel: No results for input(s): VITAMINB12, FOLATE, FERRITIN, TIBC, IRON, RETICCTPCT in the last 72 hours. Sepsis Labs: Recent Labs  Lab 01/28/20 1716 01/28/20 1941 01/29/20 0100 01/30/20 0751  LATICACIDVEN 5.8* 5.8* 3.5* 1.3    Recent Results (from the past 240 hour(s))  Blood Culture (routine x 2)     Status: Abnormal   Collection Time: 01/28/20  5:08 PM   Specimen: BLOOD  Result Value Ref Range Status   Specimen Description   Final    BLOOD RIGHT ANTECUBITAL Performed at [facility], [place] [address] [place], [place] [address]    Special Requests   Final    BOTTLES DRAWN AEROBIC AND ANAEROBIC Blood Culture adequate volume Performed at [place] [address], [place], [place] [address]    Culture  Setup Time   Final    GRAM NEGATIVE RODS IN BOTH AEROBIC AND ANAEROBIC BOTTLES CRITICAL RESULT  CALLED TO, READ BACK BY AND VERIFIED WITH: PHARMD MARY @0829  01/29/20 AKT    Culture (A)  Final    ESCHERICHIA COLI SUSCEPTIBILITIES PERFORMED ON PREVIOUS CULTURE WITHIN THE LAST 5 DAYS. Performed at [facility], [place] [address], [place], [place] [address]    Report Status 02/01/2020 FINAL  Final  Blood Culture ID Panel (Reflexed)     Status: Abnormal   Collection Time: 01/28/20  5:08 PM  Result Value Ref Range Status   Enterococcus species DETECTED (A) NOT DETECTED Corrected    Comment: CRITICAL RESULT CALLED TO, READ BACK BY AND VERIFIED WITH: PHARMD MARY @0829  01/29/20 AKT CORRECTED ON 02/19 AT 0936: PREVIOUSLY REPORTED AS CRITICAL RESULT CALLED TO, READ BACK BY AND VERIFIED WITH: PHARMD MARY @0829  01/29/20 AKT    Vancomycin resistance NOT DETECTED NOT DETECTED Final   Listeria monocytogenes NOT DETECTED NOT DETECTED Final   Staphylococcus species NOT DETECTED NOT DETECTED Final   Staphylococcus aureus (BCID) NOT DETECTED NOT DETECTED Final   Streptococcus species NOT DETECTED NOT DETECTED Final   Streptococcus agalactiae NOT DETECTED NOT DETECTED Final   Streptococcus pneumoniae NOT DETECTED NOT DETECTED Final   Streptococcus pyogenes NOT DETECTED NOT DETECTED Final   Acinetobacter baumannii NOT DETECTED NOT DETECTED Final   Enterobacteriaceae species DETECTED (A) NOT DETECTED Final    Comment: Enterobacteriaceae represent a large family of gram-negative bacteria, not a single organism. CRITICAL RESULT CALLED TO, READ BACK BY AND VERIFIED WITH: PHARMD MARY @0829  01/29/20 AKT    Enterobacter cloacae complex NOT DETECTED NOT DETECTED Final   Escherichia coli DETECTED (A) NOT DETECTED Final    Comment: CRITICAL RESULT CALLED TO, READ BACK BY AND VERIFIED WITH: PHARMD MARY @0829  01/29/20 AKT    Klebsiella oxytoca NOT DETECTED NOT DETECTED Final   Klebsiella pneumoniae NOT DETECTED NOT DETECTED Final   Proteus species NOT DETECTED NOT DETECTED Final   Serratia marcescens NOT  DETECTED NOT DETECTED Final   Carbapenem resistance NOT DETECTED NOT DETECTED Final   Haemophilus influenzae NOT DETECTED NOT DETECTED Final   Neisseria meningitidis NOT DETECTED NOT DETECTED Final   Pseudomonas aeruginosa NOT DETECTED NOT DETECTED Final   Candida albicans NOT DETECTED NOT DETECTED Final   Candida glabrata NOT DETECTED NOT DETECTED Final   Candida krusei NOT DETECTED NOT DETECTED Final   Candida parapsilosis NOT DETECTED NOT DETECTED Final   Candida tropicalis NOT DETECTED NOT DETECTED Final    Comment: Performed at  [facility], [place] [address] [place], [place] [address]  Blood Culture (routine x 2)     Status: Abnormal   Collection Time: 01/28/20  5:16 PM   Specimen: BLOOD LEFT HAND  Result Value Ref Range Status   Specimen Description   Final    BLOOD LEFT HAND Performed at [place] [address] [place], [place] [address]    Special Requests   Final    BOTTLES DRAWN AEROBIC AND ANAEROBIC Blood Culture adequate volume Performed at [place] [address] [place], [place] [address]    Culture  Setup Time   Final    GRAM NEGATIVE RODS IN BOTH AEROBIC AND ANAEROBIC BOTTLES CRITICAL VALUE NOTED.  VALUE IS CONSISTENT WITH PREVIOUSLY REPORTED AND CALLED VALUE. Performed at [facility], [place] [address] [place], [place] [address]    Culture ESCHERICHIA COLI (A)  Final   Report Status 02/01/2020 FINAL  Final   Organism ID, Bacteria ESCHERICHIA COLI  Final      Susceptibility   Escherichia coli - MIC*    AMPICILLIN 16 INTERMEDIATE Intermediate     CEFAZOLIN <=4 SENSITIVE Sensitive     CEFEPIME <=0.12 SENSITIVE Sensitive     CEFTAZIDIME <=1 SENSITIVE Sensitive     CEFTRIAXONE <=0.25 SENSITIVE Sensitive     CIPROFLOXACIN <=0.25 SENSITIVE Sensitive     GENTAMICIN <=1 SENSITIVE Sensitive     IMIPENEM <=0.25 SENSITIVE Sensitive     TRIMETH/SULFA <=20 SENSITIVE Sensitive     AMPICILLIN/SULBACTAM <=2  SENSITIVE Sensitive     PIP/TAZO <=4 SENSITIVE Sensitive     * ESCHERICHIA COLI  Urine culture     Status: Abnormal   Collection Time: 01/28/20  5:54 PM   Specimen: In/Out Cath Urine  Result Value Ref Range Status   Specimen Description   Final    IN/OUT CATH URINE Performed at [facility], [place] [address] [place], [place] [address]    Special Requests   Final    NONE Performed at [facility], [place] [address] [place], [place] [address]    Culture MULTIPLE SPECIES PRESENT, SUGGEST RECOLLECTION (A)  Final   Report Status 01/29/2020 FINAL  Final  SARS CORONAVIRUS 2 (TAT 6-24 HRS) Nasopharyngeal Nasopharyngeal Swab     Status: None   Collection Time: 01/28/20  8:54 PM   Specimen: Nasopharyngeal Swab  Result Value Ref Range Status   SARS Coronavirus 2 NEGATIVE NEGATIVE Final    Comment: (NOTE) SARS-CoV-2 target nucleic acids are NOT DETECTED. The SARS-CoV-2 RNA is generally detectable in upper and lower respiratory specimens during the acute phase of infection. Negative results do not preclude SARS-CoV-2 infection, do not rule out co-infections with other pathogens, and should not be used as the sole basis for treatment or other patient management decisions. Negative results must be combined with clinical observations, patient history, and epidemiological information. The expected result is Negative. Fact Sheet for Patients: SugarRoll.be Fact Sheet for Healthcare Providers: https://www.woods-mathews.com/ This test is not yet approved or cleared by the [place] FDA and  has been authorized for detection and/or diagnosis of SARS-CoV-2 by FDA under an Emergency Use Authorization (EUA). This EUA will remain  in effect (meaning this test can be used) for the duration of the COVID-19 declaration under Section 56 4(b)(1) of the Act, 21 U.S.C. section 360bbb-3(b)(1), unless the authorization is terminated  or revoked sooner. Performed at [facility], [place] [address], [place], [place]  [id number]   MRSA PCR Screening     Status: None   Collection Time: 01/28/20 11:55 PM   Specimen: Nasal Mucosa; Nasopharyngeal  Result Value Ref Range Status   MRSA by PCR NEGATIVE NEGATIVE Final    Comment:        The GeneXpert MRSA Assay (FDA approved for NASAL specimens only), is one component of a comprehensive MRSA colonization surveillance program. It is not intended to diagnose MRSA infection nor to guide or monitor treatment for MRSA infections. Performed at [facility], [place] [address], [place], [place] [address]   Culture, blood (Routine X 2) w Reflex to ID Panel     Status: None (Preliminary result)   Collection Time: 01/30/20  8:00 AM   Specimen: BLOOD  Result Value Ref Range Status   Specimen Description   Final    BLOOD RIGHT ARM Performed at [facility] [address], [place], [place] [address]    Special Requests   Final    BOTTLES DRAWN AEROBIC AND ANAEROBIC Blood Culture adequate volume Performed at [place] [address] [place], [place] [address]    Culture   Final    NO GROWTH 3 DAYS Performed at [facility], [place] [address], [place], [place] [address]    Report Status PENDING  Incomplete         Radiology Studies: No results found.      Scheduled Meds: . bicalutamide  50 mg Oral Daily  . Chlorhexidine Gluconate Cloth  6 each Topical Daily  . feeding supplement  1 Container Oral BID BM  . feeding supplement (ENSURE ENLIVE)  237 mL Oral BID BM  . mouth rinse  15 mL Mouth Rinse BID  . metoprolol tartrate  50 mg Oral BID  . multivitamin with minerals  1 tablet Oral Daily  . sodium chloride flush  3 mL Intravenous Q12H   Continuous Infusions: . sodium chloride 100 mL/hr at 02/03/20 1228  . ampicillin-sulbactam (UNASYN) IV 3 g (02/03/20 1230)     LOS: 6 days    Time spent: 32  minutes spent on chart review, discussion with nursing staff, consultants, updating family and interview/physical exam; more than 50% of that time was spent in counseling and/or coordination of care.    Kuba Shepherd J [place] DO [facility] Available via Epic secure chat 7am-7pm After these hours, please refer to coverage provider listed on [email or web address] 02/03/2020, 1:13 PM

## 2020-02-03 NOTE — Progress Notes (Signed)
Central tele  reported patient having 8 runs of SVTs. Patient is asymptomatic with stable vital signs. Patient denies SOB and chest pain. On-call provider made aware. Will continue to monitor.

## 2020-02-03 NOTE — Progress Notes (Signed)
Frank Daugherty   DOB:[birth year]   [id number]   [id number]  Oncology follow up   Subjective: Pt overall is stable, still has low appetite, fatigued. Lab improved. Denies pain or any other new complains.    Objective:  Vitals:   02/03/20 1010 02/03/20 1011  BP:  124/74  Pulse: (!) 113 (!) 103  Resp:    Temp:    SpO2:      Body mass index is 23.26 kg/m.  Intake/Output Summary (Last 24 hours) at 02/03/2020 1221 Last data filed at 02/03/2020 1000 Gross per 24 hour  Intake 1393.22 ml  Output 975 ml  Net 418.22 ml     Sclerae unicteric  Oropharynx clear  No peripheral adenopathy  Lungs clear -- no rales or rhonchi  Heart regular rate and rhythm  Abdomen benign  MSK no focal spinal tenderness, no peripheral edema  Neuro nonfocal   CBG (last 3)  No results for input(s): GLUCAP in the last 72 hours.   Labs:  Urine Studies No results for input(s): UHGB, CRYS in the last 72 hours.  Invalid input(s): UACOL, UAPR, USPG, UPH, UTP, UGL, UKET, UBIL, UNIT, UROB, ULEU, UEPI, UWBC, URBC, UBAC, CAST, [place], [place]  Basic Metabolic Panel: Recent Labs  Lab 01/29/20 ([phone]) [phone] 01/29/20 0718 01/30/20 0800 01/30/20 0800 01/31/20 0454 01/31/20 0454 02/01/20 0436 02/01/20 0436 02/02/20 0436 02/03/20 0504  NA 140   < > 142  --  143  --  143  --  141 142  K 4.3   < > 3.5   < > 3.5   < > 3.4*   < > 4.0 3.7  CL 110   < > 115*  --  115*  --  116*  --  114* 113*  CO2 14*   < > 16*  --  19*  --  18*  --  18* 18*  GLUCOSE 101*   < > 118*  --  113*  --  131*  --  139* 134*  BUN 41*   < > 38*  --  32*  --  28*  --  24* 20  CREATININE 2.14*   < > 1.66*  --  1.43*  --  1.36*  --  1.13 0.99  CALCIUM 6.0*   < > 6.1*  --  6.8*  --  7.2*  --  7.3* 7.4*  MG 1.9  --  1.8  --  2.0  --  2.0  --  1.9  --    < > = values in this interval not displayed.   GFR Estimated Creatinine Clearance: 69.7 mL/min (by C-G formula based on SCr of 0.99 mg/dL). Liver Function Tests: Recent Labs  Lab  01/30/20 0800 01/31/20 0454 02/01/20 0436 02/02/20 0436 02/03/20 0504  AST 129* 96* 85* 82* 74*  ALT 107* 96* 89* 85* 77*  ALKPHOS 763* 752* 746* 720* 683*  BILITOT 2.5* 3.1* 2.9* 1.7* 1.5*  PROT 5.1* 5.0* 5.2* 5.0* 5.1*  ALBUMIN 1.5* 1.4* 1.4* 1.6* 1.6*   No results for input(s): LIPASE, AMYLASE in the last 168 hours. Recent Labs  Lab 01/29/20 0100  AMMONIA 15   Coagulation profile Recent Labs  Lab 01/28/20 1716 01/29/20 0100 01/30/20 0800 01/31/20 0454 02/01/20 0436  INR 2.1* 2.2* 1.8* 1.7* 1.6*    CBC: Recent Labs  Lab 01/28/20 1716 01/28/20 1716 01/29/20 0718 01/29/20 0718 01/30/20 0800 01/31/20 0454 02/01/20 0436 02/02/20 0436 02/03/20 0504  WBC 11.0*   < > 7.8   < >  8.0 8.9 10.9* 13.1* 16.8*  NEUTROABS 8.4*  --  6.0  --   --   --   --   --   --   HGB 6.7*   < > 10.4*   < > 8.5* 8.6* 8.6* 8.7* 9.1*  HCT 21.0*   < > 32.4*   < > 24.9* 25.8* 26.1* 26.7* 27.6*  MCV 84.3   < > 84.8   < > 81.9 83.0 83.7 85.6 85.4  PLT 135*   < > 122*   < > 84* 77* 71* 63* 60*   < > = values in this interval not displayed.   Cardiac Enzymes: No results for input(s): CKTOTAL, CKMB, CKMBINDEX, TROPONINI in the last 168 hours. BNP: Invalid input(s): POCBNP CBG: Recent Labs  Lab 01/29/20 2344  GLUCAP 123*   D-Dimer No results for input(s): DDIMER in the last 72 hours. Hgb A1c No results for input(s): HGBA1C in the last 72 hours. Lipid Profile No results for input(s): CHOL, HDL, LDLCALC, TRIG, CHOLHDL, LDLDIRECT in the last 72 hours. Thyroid function studies No results for input(s): TSH, T4TOTAL, T3FREE, THYROIDAB in the last 72 hours.  Invalid input(s): FREET3 Anemia work up No results for input(s): VITAMINB12, FOLATE, FERRITIN, TIBC, IRON, RETICCTPCT in the last 72 hours. Microbiology Recent Results (from the past 240 hour(s))  Blood Culture (routine x 2)     Status: Abnormal   Collection Time: 01/28/20  5:08 PM   Specimen: BLOOD  Result Value Ref Range Status    Specimen Description   Final    BLOOD RIGHT ANTECUBITAL Performed at [facility] [address], [place], [place] [address]    Special Requests   Final    BOTTLES DRAWN AEROBIC AND ANAEROBIC Blood Culture adequate volume Performed at [facility] [address], [place], [place] [address]    Culture  Setup Time   Final    GRAM NEGATIVE RODS IN BOTH AEROBIC AND ANAEROBIC BOTTLES CRITICAL RESULT CALLED TO, READ BACK BY AND VERIFIED WITH: PHARMD MARY @0829  01/29/20 AKT    Culture (A)  Final    ESCHERICHIA COLI SUSCEPTIBILITIES PERFORMED ON PREVIOUS CULTURE WITHIN THE LAST 5 DAYS. Performed at [facility], [place] [address], [place], [place] [address]    Report Status 02/01/2020 FINAL  Final  Blood Culture ID Panel (Reflexed)     Status: Abnormal   Collection Time: 01/28/20  5:08 PM  Result Value Ref Range Status   Enterococcus species DETECTED (A) NOT DETECTED Corrected    Comment: CRITICAL RESULT CALLED TO, READ BACK BY AND VERIFIED WITH: PHARMD MARY @0829  01/29/20 AKT CORRECTED ON 02/19 AT 0936: PREVIOUSLY REPORTED AS CRITICAL RESULT CALLED TO, READ BACK BY AND VERIFIED WITH: PHARMD MARY @0829  01/29/20 AKT    Vancomycin resistance NOT DETECTED NOT DETECTED Final   Listeria monocytogenes NOT DETECTED NOT DETECTED Final   Staphylococcus species NOT DETECTED NOT DETECTED Final   Staphylococcus aureus (BCID) NOT DETECTED NOT DETECTED Final   Streptococcus species NOT DETECTED NOT DETECTED Final   Streptococcus agalactiae NOT DETECTED NOT DETECTED Final   Streptococcus pneumoniae NOT DETECTED NOT DETECTED Final   Streptococcus pyogenes NOT DETECTED NOT DETECTED Final   Acinetobacter baumannii NOT DETECTED NOT DETECTED Final   Enterobacteriaceae species DETECTED (A) NOT DETECTED Final    Comment: Enterobacteriaceae represent a large family of gram-negative bacteria, not a single organism. CRITICAL RESULT CALLED TO, READ BACK BY AND VERIFIED  WITH: PHARMD MARY @0829  01/29/20 AKT  Enterobacter cloacae complex NOT DETECTED NOT DETECTED Final   Escherichia coli DETECTED (A) NOT DETECTED Final    Comment: CRITICAL RESULT CALLED TO, READ BACK BY AND VERIFIED WITH: PHARMD MARY @0829  01/29/20 AKT    Klebsiella oxytoca NOT DETECTED NOT DETECTED Final   Klebsiella pneumoniae NOT DETECTED NOT DETECTED Final   Proteus species NOT DETECTED NOT DETECTED Final   Serratia marcescens NOT DETECTED NOT DETECTED Final   Carbapenem resistance NOT DETECTED NOT DETECTED Final   Haemophilus influenzae NOT DETECTED NOT DETECTED Final   Neisseria meningitidis NOT DETECTED NOT DETECTED Final   Pseudomonas aeruginosa NOT DETECTED NOT DETECTED Final   Candida albicans NOT DETECTED NOT DETECTED Final   Candida glabrata NOT DETECTED NOT DETECTED Final   Candida krusei NOT DETECTED NOT DETECTED Final   Candida parapsilosis NOT DETECTED NOT DETECTED Final   Candida tropicalis NOT DETECTED NOT DETECTED Final    Comment: Performed at [facility], [place] [address] [place], [place] [address]  Blood Culture (routine x 2)     Status: Abnormal   Collection Time: 01/28/20  5:16 PM   Specimen: BLOOD LEFT HAND  Result Value Ref Range Status   Specimen Description   Final    BLOOD LEFT HAND Performed at [facility] [address], [place], [place] [address]    Special Requests   Final    BOTTLES DRAWN AEROBIC AND ANAEROBIC Blood Culture adequate volume Performed at [facility] [address] [place], [place] [address]    Culture  Setup Time   Final    GRAM NEGATIVE RODS IN BOTH AEROBIC AND ANAEROBIC BOTTLES CRITICAL VALUE NOTED.  VALUE IS CONSISTENT WITH PREVIOUSLY REPORTED AND CALLED VALUE. Performed at [facility], [place] [address] [place], [place] [address]    Culture ESCHERICHIA COLI (A)  Final   Report Status 02/01/2020 FINAL  Final   Organism ID, Bacteria ESCHERICHIA COLI  Final       Susceptibility   Escherichia coli - MIC*    AMPICILLIN 16 INTERMEDIATE Intermediate     CEFAZOLIN <=4 SENSITIVE Sensitive     CEFEPIME <=0.12 SENSITIVE Sensitive     CEFTAZIDIME <=1 SENSITIVE Sensitive     CEFTRIAXONE <=0.25 SENSITIVE Sensitive     CIPROFLOXACIN <=0.25 SENSITIVE Sensitive     GENTAMICIN <=1 SENSITIVE Sensitive     IMIPENEM <=0.25 SENSITIVE Sensitive     TRIMETH/SULFA <=20 SENSITIVE Sensitive     AMPICILLIN/SULBACTAM <=2 SENSITIVE Sensitive     PIP/TAZO <=4 SENSITIVE Sensitive     * ESCHERICHIA COLI  Urine culture     Status: Abnormal   Collection Time: 01/28/20  5:54 PM   Specimen: In/Out Cath Urine  Result Value Ref Range Status   Specimen Description   Final    IN/OUT CATH URINE Performed at [facility], [place] [address] [place], [place] [address]    Special Requests   Final    NONE Performed at [facility], [place] [address] [place],  [address]    Culture MULTIPLE SPECIES PRESENT, SUGGEST RECOLLECTION (A)  Final   Report Status 01/29/2020 FINAL  Final  SARS CORONAVIRUS 2 (TAT 6-24 HRS) Nasopharyngeal Nasopharyngeal Swab     Status: None   Collection Time: 01/28/20  8:54 PM   Specimen: Nasopharyngeal Swab  Result Value Ref Range Status   SARS Coronavirus 2 NEGATIVE NEGATIVE Final    Comment: (NOTE) SARS-CoV-2 target nucleic acids are NOT DETECTED. The SARS-CoV-2  RNA is generally detectable in upper and lower respiratory specimens during the acute phase of infection. Negative results do not preclude SARS-CoV-2 infection, do not rule out co-infections with other pathogens, and should not be used as the sole basis for treatment or other patient management decisions. Negative results must be combined with clinical observations, patient history, and epidemiological information. The expected result is Negative. Fact Sheet for Patients: SugarRoll.be Fact Sheet for Healthcare  Providers: https://www.woods-mathews.com/ This test is not yet approved or cleared by the [place] FDA and  has been authorized for detection and/or diagnosis of SARS-CoV-2 by FDA under an Emergency Use Authorization (EUA). This EUA will remain  in effect (meaning this test can be used) for the duration of the COVID-19 declaration under Section 56 4(b)(1) of the Act, 21 U.S.C. section 360bbb-3(b)(1), unless the authorization is terminated or revoked sooner. Performed at [facility], [place] [address], [place], [place] [address]   MRSA PCR Screening     Status: None   Collection Time: 01/28/20 11:55 PM   Specimen: Nasal Mucosa; Nasopharyngeal  Result Value Ref Range Status   MRSA by PCR NEGATIVE NEGATIVE Final    Comment:        The GeneXpert MRSA Assay (FDA approved for NASAL specimens only), is one component of a comprehensive MRSA colonization surveillance program. It is not intended to diagnose MRSA infection nor to guide or monitor treatment for MRSA infections. Performed at [facility], [place] [address] [place], [place] [address]   Culture, blood (Routine X 2) w Reflex to ID Panel     Status: None (Preliminary result)   Collection Time: 01/30/20  8:00 AM   Specimen: BLOOD  Result Value Ref Range Status   Specimen Description   Final    BLOOD RIGHT ARM Performed at [facility] [address], [place], [place] [address]    Special Requests   Final    BOTTLES DRAWN AEROBIC AND ANAEROBIC Blood Culture adequate volume Performed at [facility] [address] [place], [place] [address]    Culture   Final    NO GROWTH 3 DAYS Performed at [facility], [place] [address] [place], [place] [address]    Report Status PENDING  Incomplete      Studies:  No results found.  Assessment: 74 y.o.  1.  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  Septic shock secondary to UTI, enterococcal and  E coli bacteremia  3.  abnormal LFTs  4.  Acute on chronic anemia 5.  AKI, resolved  6.  Coagulopathy 7.  Hypocalcemia 8.  Paroxysmal atrial fibrillation 9. Moderate thrombocytopenia secondary to sepsis and bone metastasis  10. Deconditioning  11.  Moderate protein and calorie malnutrition  Plan:  -His bacteremia and sepsis have resolved, his liver function also improved -He is due very fatigued, not eating much, I encourage him to take nutritional supplement.  We discussed appetite stimulant, he is not interested at this point. -Continue physical therapy, he will be discharged to a nursing home -I plan to see him back in 3 weeks in my clinic.  Due to the neuroendocrine differentiation of his prostate cancer, his overall prognosis is poorer than average, and he may require chemo in near future, if he became a chemo candidate  -s/p Lupron on 2/20, continue bicalutamide.  -will f/u as needed before discharge   Truitt Merle, MD 02/03/2020  12:21 PM

## 2020-02-04 ENCOUNTER — Other Ambulatory Visit: Payer: Medicare Other

## 2020-02-04 ENCOUNTER — Inpatient Hospital Stay: Admission: RE | Admit: 2020-02-04 | Payer: Medicare Other | Source: Ambulatory Visit

## 2020-02-04 ENCOUNTER — Ambulatory Visit: Payer: Medicare Other

## 2020-02-04 ENCOUNTER — Telehealth: Payer: Self-pay | Admitting: Hematology

## 2020-02-04 LAB — COMPREHENSIVE METABOLIC PANEL
ALT: 64 U/L — ABNORMAL HIGH (ref 0–44)
AST: 56 U/L — ABNORMAL HIGH (ref 15–41)
Albumin: 1.5 g/dL — ABNORMAL LOW (ref 3.5–5.0)
Alkaline Phosphatase: 632 U/L — ABNORMAL HIGH (ref 38–126)
Anion gap: 9 (ref 5–15)
BUN: 18 mg/dL (ref 8–23)
CO2: 19 mmol/L — ABNORMAL LOW (ref 22–32)
Calcium: 7.1 mg/dL — ABNORMAL LOW (ref 8.9–10.3)
Chloride: 118 mmol/L — ABNORMAL HIGH (ref 98–111)
Creatinine, Ser: 0.93 mg/dL (ref 0.61–1.24)
GFR calc Af Amer: >60 mL/min (ref >60)
GFR calc non Af Amer: >60 mL/min (ref >60)
Glucose, Bld: 144 mg/dL — ABNORMAL HIGH (ref 70–99)
Potassium: 3.5 mmol/L (ref 3.5–5.1)
Sodium: 146 mmol/L — ABNORMAL HIGH (ref 135–145)
Total Bilirubin: 1 mg/dL (ref 0.3–1.2)
Total Protein: 5.1 g/dL — ABNORMAL LOW (ref 6.5–8.1)

## 2020-02-04 LAB — SARS CORONAVIRUS 2 (TAT 6-24 HRS): SARS Coronavirus 2: NEGATIVE

## 2020-02-04 LAB — CBC
HCT: 27.7 % — ABNORMAL LOW (ref 39.0–52.0)
Hemoglobin: 8.9 g/dL — ABNORMAL LOW (ref 13.0–17.0)
MCH: 27.8 pg (ref 26.0–34.0)
MCHC: 32.1 g/dL (ref 30.0–36.0)
MCV: 86.6 fL (ref 80.0–100.0)
Platelets: 65 10*3/uL — ABNORMAL LOW (ref 150–400)
RBC: 3.2 MIL/uL — ABNORMAL LOW (ref 4.22–5.81)
RDW: 20.1 % — ABNORMAL HIGH (ref 11.5–15.5)
WBC: 17.2 10*3/uL — ABNORMAL HIGH (ref 4.0–10.5)
nRBC: 5.6 % — ABNORMAL HIGH (ref 0.0–0.2)

## 2020-02-04 LAB — MAGNESIUM: Magnesium: 1.9 mg/dL (ref 1.7–2.4)

## 2020-02-04 LAB — CULTURE, BLOOD (ROUTINE X 2)
Culture: NO GROWTH
Special Requests: ADEQUATE

## 2020-02-04 MED ORDER — BOOST PLUS PO LIQD
237.0000 mL | Freq: Two times a day (BID) | ORAL | Status: DC
  Administered 2020-02-04 (×2): 237 mL via ORAL
  Filled 2020-02-04 (×4): qty 237

## 2020-02-04 MED ORDER — AMOXICILLIN-POT CLAVULANATE 875-125 MG PO TABS
1.0000 | ORAL_TABLET | Freq: Two times a day (BID) | ORAL | Status: DC
  Administered 2020-02-05: 1 via ORAL
  Filled 2020-02-04: qty 1

## 2020-02-04 MED ORDER — POTASSIUM CHLORIDE CRYS ER 20 MEQ PO TBCR
40.0000 meq | EXTENDED_RELEASE_TABLET | Freq: Once | ORAL | Status: AC
  Administered 2020-02-04: 40 meq via ORAL
  Filled 2020-02-04: qty 2

## 2020-02-04 NOTE — Care Management Important Message (Signed)
Important Message  Patient Details IM Letter given to [name] RN Case Manager to present to the Patient Name: Frank Daugherty MRN: [id number] Date of Birth: [birth year]   Medicare Important Message Given:  Yes     [name] 02/04/2020, 2:07 PM

## 2020-02-04 NOTE — Plan of Care (Signed)
  Problem: Education: Goal: Knowledge of General Education information will improve Description: Including pain rating scale, medication(s)/side effects and non-pharmacologic comfort measures Outcome: Progressing   Problem: Health Behavior/Discharge Planning: Goal: Ability to manage health-related needs will improve Outcome: Progressing   Problem: Clinical Measurements: Goal: Ability to maintain clinical measurements within normal limits will improve Outcome: Progressing Goal: Will remain free from infection Outcome: Progressing Goal: Diagnostic test results will improve Outcome: Progressing Goal: Cardiovascular complication will be avoided Outcome: Progressing   Problem: Activity: Goal: Risk for activity intolerance will decrease Outcome: Progressing   Problem: Nutrition: Goal: Adequate nutrition will be maintained Outcome: Progressing   Problem: Safety: Goal: Ability to remain free from injury will improve Outcome: Progressing   Problem: Fluid Volume: Goal: Hemodynamic stability will improve Outcome: Progressing   Problem: Clinical Measurements: Goal: Diagnostic test results will improve Outcome: Progressing Goal: Signs and symptoms of infection will decrease Outcome: Progressing   Problem: Respiratory: Goal: Ability to maintain adequate ventilation will improve Outcome: Progressing

## 2020-02-04 NOTE — TOC Progression Note (Signed)
Transition of Care [facility]) - Progression Note    Patient Details  Name: Frank Daugherty MRN: [id number] Date of Birth: [birth year]  Transition of [facility]) CM/SW Contact  Chalyn Amescua, Juliann Pulse, RN Phone Number: 02/04/2020, 4:06 PM  Clinical Narrative:  Answered patient's son questions about d/c in am to [facility] w/palliative care services.          Expected Discharge Plan and Services                                                 Social Determinants of Health (SDOH) Interventions    Readmission Risk Interventions No flowsheet data found.

## 2020-02-04 NOTE — Progress Notes (Signed)
Nutrition Follow-up  RD working remotely.   DOCUMENTATION CODES:   Not applicable  INTERVENTION:  - continue Boost Breeze BID. -will d/c Ensure Enlive and order Boost Plus BID, each supplement provides 360 kcal and 14 grams protein. - continue to encourage PO intakes.    NUTRITION DIAGNOSIS:   Increased nutrient needs related to chronic illness, cancer and cancer related treatments, acute illness(sepsis/septic shock) as evidenced by estimated needs. -ongoing  GOAL:   Patient will meet greater than or equal to 90% of their needs -unmet  MONITOR:   PO intake, Supplement acceptance, Labs, Weight trends  ASSESSMENT:   74 year old male with medical history of a. 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 He presented to the ED from [facility] with weakness, fever, and AMS x2 days. He was given 2.5L IV fluid in the ED. Patient admitted for septic shock 2/2 UTI.  Per flow sheet documentation, he consumed 0% of lunch and dinner on 2/23; 0% of breakfast and lunch and 40% of dinner on 2/24. He has been accepting 100% of boost breeze and 50-75% of Ensure Enlive. Per chart review, weight has been trending up since admission on 2/18.   Per notes: - elevated LFTs 2/2 shock liver - ARF--possibly 2/2 ATN; plan for Foley to remain in place - acute on chronic anemia - metastatic cancer with unknown primary (thought to possibly be prostate) with mets to bone, liver, and bladder and bilateral pleural effusions--possible TURP - weakness and debility with recommendation to return to SNF [facility]) at d/c--likely d/c 2/26 - poor prognosis   Labs reviewed; Na: 146 mmol/l, Cl: 118 mmol/l, Ca: 8.1 mg/dl, Alk Phos elevated (trending down), LFTs elevated (trending down). Medications reviewed; daily multivitamin with minerals, 40 mEq Klor-Con x1 dose 2/25.     NUTRITION - FOCUSED PHYSICAL EXAM:  unable to complete at this time.   Diet Order:   Diet  Order            Diet regular Room service appropriate? Yes; Fluid consistency: Thin  Diet effective now              EDUCATION NEEDS:   No education needs have been identified at this time  Skin:  Skin Assessment: Reviewed RN Assessment  Last BM:  2/23  Height:   Ht Readings from Last 1 Encounters:  01/29/20 _0  (1.803 m)    Weight:   Wt Readings from Last 1 Encounters:  02/04/20 78 kg    Estimated Nutritional Needs:  Kcal:  2200-2500 kcal Protein:  110-125 grams Fluid:  >/= 2.5 L/day     Jarome Matin, MS, RD, LDN, CNSC Inpatient Clinical Dietitian RD pager # available in [facility]  After hours/weekend pager # available in [facility]

## 2020-02-04 NOTE — Progress Notes (Signed)
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  In the ED, temperature one 1.5, HR 111, BP 82/68, RR 21, SPO2 94% on room air.  Creatinine 2.42 (was 0.8 on 01/13/2020), calcium 5.5 corrected for albumin of 1.827.3.  Alkaline phosphatase 870, AST 235, ALT 132, total bilirubin 1.4, WBC count 11, hemoglobin 6.7, platelets 135, INR 2.1, urinalysis with large leukoesterase, many bacteria, negative nitrite, greater than 50 WBCs.  Chest x-ray with no acute cardiopulmonary disease process.  Patient was given 2.5 L NS bolus, started on cefepime and vancomycin and 1 unit PRBC ordered for transfusion.  Patient referred for admission for septic shock secondary to urinary source.   Assessment & Plan:   Principal Problem:   Severe sepsis with septic shock (HCC) Active Problems:   Coagulopathy (HCC)   Acute lower UTI   Elevated lactic acid level   Acute on chronic anemia   Acute metabolic encephalopathy   AKI (acute kidney injury) ([place])   Hypocalcemia   Transaminitis   Palliative care by specialist   Goals of care, counseling/discussion   General weakness   Prostate cancer metastatic to bone ([place])   E coli bacteremia   Septic shock; present on admission E. coli, Enterococcus septicemia Lactic acidosis Patient presenting from SNF with 2-day history of worsening mental status, fever, and weakness.  Patient was febrile up to 1-1.5 with a blood pressure of 73/36 on admission.  Elevated lactic acid of 5.8 with underlying AKI and likely shock liver.  Blood cultures positive for Enterococcus and E. coli.  Received 2.5 L NS  bolus in ED. Foley catheter exchanged --Infectious disease following, appreciate assistance --WBC 11.0-->7.8-->8.0-->8.9-->10.9-->13.1-->16.8-->17.2 --Lactic acid 5.8-->3.5-->1.3 --Vancomycin/cefepime-->Zosyn 2/19-->Unasyn 2/21, will transition to Augmentin on 2/26 to complete 14 day antibiotic course --Repeat Blood cultures 01/30/2020: no growth x 4 days --Continue supportive care, antiemetics, antipyretics  Elevated LFTs likely secondary to shock liver Elevated bilirubin Patient was noted to have an elevated AST of 235 and ALT of 132 on presentation with a total bilirubin of 1.4.  Etiology likely secondary to shock liver with significant hypotension in setting of sepsis as above.  Acute hepatitis panel negative. --AST 235-->217-->129-->96-->85-->82-->74-->56 --ALT 132-->139-->107-->96-->89-->85-->77-->64 --Tbili 1.4-->3.2-->2.5-->3.1-->2.9-->1.7-->1.5-->1.0 --Continue IV antibiotics and supportive care as above  Acute renal failure Creatinine 2.42 on presentation. FeNA 0.1%, consistent with prerenal.  Also suspect possible some ATN secondary to septic shock with hypotension as above.  Complicated by chronic indwelling Foley catheter with obstructive uropathy.  Foley catheter exchanged on 01/29/2020. --Cr 2.42-->2.14-->1.66-->1.43-->1.36-->1.13-->0.99-->0.93 --Avoid nephrotoxins, renally dose all medications --Continue Foley catheter, will remain in place on discharge until follows up with urology outpatient for consideration of channel TURP --Follow renal function daily  Paroxysmal atrial fibrillation CHA2DS2-VASc score of 2.  Given history of coagulopathy, risks versus benefits discussions were performed during previous hospitalization with decision to refrain from anticoagulation.  On Toprol-XL 100 mg p.o. daily at home. --Currently in NSR, continue monitor on telemetry --increase metoprolol tartrate to 100 mg p.o. BID with holding parameters  Acute on chronic anemia Baseline  hemoglobin 8-10.  Etiology likely secondary to multifactorial with hypoproliferative in the setting of metastatic disease to the bone in addition  to hepatic insufficiency.  Hemoglobin on presentation 6.7, most recent prior value of 8.5 on 01/14/2020.  No obvious signs of blood loss/bleeding.  Iron level 22, TIBC 166, ferritin greater than 7500.  Etiology likely secondary to malignancy with bony metastasis with likely marrow infiltration. --s/p 1u pRBC on 2/18 --Hgb 6.7-->10.4-->8.5-->8.6-->8.6-->9.1-->8.9 --continue to monitor hemoglobin closely, transfuse for hemoglobin <8.0 or active bleeding  Coagulopathy INR elevated 2.2, likely secondary to suspected decreased hepatic synthetic function due to metastatic disease.  No signs of active bleeding. --INR 2.2-->1.8-->1.7-->1.6  Hypocalcemia Calcium 7.4 with albumin of 1.6, corrected for hypoalbuminemia, Ca = 9.3.  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 Burr Medico.  PSA 1,101.  MR pelvis with IV contrast notable for enhancing tumor mid to apex prostate with invasion of bladder wall consistent with likely prostate primary.  CT chest with no obvious lung mass but with bilateral pleural effusions and diffuse sclerotic osseous metastasis.  --Medical oncology, urology, palliative care following, appreciate assistance --s/p Lupron --Continue Casodex  --continue foley in place per urology with plan possible channel TURP to relieve bladder outlet obstruction outpatient --outpatient f/u with Oncology, Dr. Burr Medico in 3 weeks following discharge  Weakness, debility: --PT/OT recommend return to SNF on discharge; continue therapy efforts while inpatient.  Adult failure to thrive Severe protein calorie malnutrition Body mass index is 23.98 kg/m. Nutrition Status: Nutrition Problem: Increased nutrient needs Etiology: chronic illness, cancer and cancer related treatments, acute  illness(sepsis/septic shock) Signs/Symptoms: estimated needs Interventions: Boost Breeze, Ensure Enlive (each supplement provides 350kcal and 20 grams of protein), MVI    DVT prophylaxis: SCDs Code Status: Partial code, okay for CPR, no intubation Family Communication: Updated patient at bedside Disposition Plan: Continue inpatient, ultimately poor prognosis given metastatic disease from likely primary prostate cancer; barriers to discharge include continues on IV antibiotics per ID,  plan to return to [facility] on 02/05/2020   Consultants:   Medical oncology - Dr. Burr Medico  Infectious disease - Dr. Johnnye Sima  Urology - Dr. Alinda Money  Palliative care - Dr. Rowe Pavy  Procedures:   None  Antimicrobials:   Vancomycin 2/18 - 2/19  Cefepime 2/18 - 2/19  Zosyn 2/19 - 2/21  Unasyn 2/21>>   Subjective: Patient seen and examined bedside, lying in bed.  No specific complaints this morning.  Very little solid oral intake, does better with fluids.  Continues to be encouraged by nursing staff to eat more.  States just does not have any appetite.  Declines any appetite stimulant.  Pain control.  Denies headache, no chest pain, no shortness of breath, no abdominal pain.  No acute events overnight per nursing staff.  Objective: Vitals:   02/03/20 2202 02/04/20 0500 02/04/20 0537 02/04/20 0921  BP: 120/81  125/84 118/88  Pulse: 96  92 96  Resp: 17  17   Temp: 98.4 F (36.9 C)  98 F (36.7 C) 99.2 F (37.3 C)  TempSrc: Oral  Oral Oral  SpO2: 95%  98%   Weight:  78 kg    Height:        Intake/Output Summary (Last 24 hours) at 02/04/2020 1140 Last data filed at 02/04/2020 1045 Gross per 24 hour  Intake 2597.97 ml  Output 695 ml  Net 1902.97 ml   Filed Weights   02/01/20 0500 02/03/20 0500 02/04/20 0500  Weight: 77 kg 75.7 kg 78 kg    Examination:  General exam: Appears calm and comfortable, thin/cachectic in appearance Respiratory system: Clear to  auscultation. Respiratory  effort normal.  Oxygenating well on room air Cardiovascular system: S1 & S2 heard, RRR. No JVD, murmurs, rubs, gallops or clicks. No pedal edema. Gastrointestinal system: Abdomen is nondistended, soft and nontender. No organomegaly or masses felt. Normal bowel sounds heard. GU: Foley catheter noted in place draining clear yellow urine Central nervous system: Alert and oriented. No focal neurological deficits. Extremities: Symmetric 5 x 5 power. Skin: No rashes, lesions or ulcers Psychiatry: Judgement and insight appear poor. Mood & affect appropriate.     Data Reviewed: I have personally reviewed following labs and imaging studies  CBC: Recent Labs  Lab 01/28/20 1716 01/28/20 1716 01/29/20 0718 01/30/20 0800 01/31/20 0454 02/01/20 0436 02/02/20 0436 02/03/20 0504 02/04/20 0445  WBC 11.0*   < > 7.8   < > 8.9 10.9* 13.1* 16.8* 17.2*  NEUTROABS 8.4*  --  6.0  --   --   --   --   --   --   HGB 6.7*   < > 10.4*   < > 8.6* 8.6* 8.7* 9.1* 8.9*  HCT 21.0*   < > 32.4*   < > 25.8* 26.1* 26.7* 27.6* 27.7*  MCV 84.3   < > 84.8   < > 83.0 83.7 85.6 85.4 86.6  PLT 135*   < > 122*   < > 77* 71* 63* 60* 65*   < > = values in this interval not displayed.   Basic Metabolic Panel: Recent Labs  Lab 01/30/20 0800 01/30/20 0800 01/31/20 0454 02/01/20 0436 02/02/20 0436 02/03/20 0504 02/04/20 0445  NA 142   < > 143 143 141 142 146*  K 3.5   < > 3.5 3.4* 4.0 3.7 3.5  CL 115*   < > 115* 116* 114* 113* 118*  CO2 16*   < > 19* 18* 18* 18* 19*  GLUCOSE 118*   < > 113* 131* 139* 134* 144*  BUN 38*   < > 32* 28* 24* 20 18  CREATININE 1.66*   < > 1.43* 1.36* 1.13 0.99 0.93  CALCIUM 6.1*   < > 6.8* 7.2* 7.3* 7.4* 7.1*  MG 1.8  --  2.0 2.0 1.9  --  1.9   < > = values in this interval not displayed.   GFR: Estimated Creatinine Clearance: 74.2 mL/min (by C-G formula based on SCr of 0.93 mg/dL). Liver Function Tests: Recent Labs  Lab 01/31/20 0454 02/01/20 0436 02/02/20 0436 02/03/20 0504  02/04/20 0445  AST 96* 85* 82* 74* 56*  ALT 96* 89* 85* 77* 64*  ALKPHOS 752* 746* 720* 683* 632*  BILITOT 3.1* 2.9* 1.7* 1.5* 1.0  PROT 5.0* 5.2* 5.0* 5.1* 5.1*  ALBUMIN 1.4* 1.4* 1.6* 1.6* 1.5*   No results for input(s): LIPASE, AMYLASE in the last 168 hours. Recent Labs  Lab 01/29/20 0100  AMMONIA 15   Coagulation Profile: Recent Labs  Lab 01/28/20 1716 01/29/20 0100 01/30/20 0800 01/31/20 0454 02/01/20 0436  INR 2.1* 2.2* 1.8* 1.7* 1.6*   Cardiac Enzymes: No results for input(s): CKTOTAL, CKMB, CKMBINDEX, TROPONINI in the last 168 hours. BNP (last 3 results) No results for input(s): PROBNP in the last 8760 hours. HbA1C: No results for input(s): HGBA1C in the last 72 hours. CBG: Recent Labs  Lab 01/29/20 2344  GLUCAP 123*   Lipid Profile: No results for input(s): CHOL, HDL, LDLCALC, TRIG, CHOLHDL, LDLDIRECT in the last 72 hours. Thyroid Function Tests: No results for input(s): TSH, T4TOTAL, FREET4, T3FREE, THYROIDAB in the last  72 hours. Anemia Panel: No results for input(s): VITAMINB12, FOLATE, FERRITIN, TIBC, IRON, RETICCTPCT in the last 72 hours. Sepsis Labs: Recent Labs  Lab 01/28/20 1716 01/28/20 1941 01/29/20 0100 01/30/20 0751  LATICACIDVEN 5.8* 5.8* 3.5* 1.3    Recent Results (from the past 240 hour(s))  Blood Culture (routine x 2)     Status: Abnormal   Collection Time: 01/28/20  5:08 PM   Specimen: BLOOD  Result Value Ref Range Status   Specimen Description   Final    BLOOD RIGHT ANTECUBITAL Performed at [facility], [place] [address] [place], [place] [address]    Special Requests   Final    BOTTLES DRAWN AEROBIC AND ANAEROBIC Blood Culture adequate volume Performed at [place] [address], [place], [place] [address]    Culture  Setup Time   Final    GRAM NEGATIVE RODS IN BOTH AEROBIC AND ANAEROBIC BOTTLES CRITICAL RESULT CALLED TO, READ BACK BY AND VERIFIED WITH: PHARMD MARY @0829  01/29/20  AKT    Culture (A)  Final    ESCHERICHIA COLI SUSCEPTIBILITIES PERFORMED ON PREVIOUS CULTURE WITHIN THE LAST 5 DAYS. Performed at [facility], [place] [address], [place], [place] [address]    Report Status 02/01/2020 FINAL  Final  Blood Culture ID Panel (Reflexed)     Status: Abnormal   Collection Time: 01/28/20  5:08 PM  Result Value Ref Range Status   Enterococcus species DETECTED (A) NOT DETECTED Corrected    Comment: CRITICAL RESULT CALLED TO, READ BACK BY AND VERIFIED WITH: PHARMD MARY @0829  01/29/20 AKT CORRECTED ON 02/19 AT 0936: PREVIOUSLY REPORTED AS CRITICAL RESULT CALLED TO, READ BACK BY AND VERIFIED WITH: PHARMD MARY @0829  01/29/20 AKT    Vancomycin resistance NOT DETECTED NOT DETECTED Final   Listeria monocytogenes NOT DETECTED NOT DETECTED Final   Staphylococcus species NOT DETECTED NOT DETECTED Final   Staphylococcus aureus (BCID) NOT DETECTED NOT DETECTED Final   Streptococcus species NOT DETECTED NOT DETECTED Final   Streptococcus agalactiae NOT DETECTED NOT DETECTED Final   Streptococcus pneumoniae NOT DETECTED NOT DETECTED Final   Streptococcus pyogenes NOT DETECTED NOT DETECTED Final   Acinetobacter baumannii NOT DETECTED NOT DETECTED Final   Enterobacteriaceae species DETECTED (A) NOT DETECTED Final    Comment: Enterobacteriaceae represent a large family of gram-negative bacteria, not a single organism. CRITICAL RESULT CALLED TO, READ BACK BY AND VERIFIED WITH: PHARMD MARY @0829  01/29/20 AKT    Enterobacter cloacae complex NOT DETECTED NOT DETECTED Final   Escherichia coli DETECTED (A) NOT DETECTED Final    Comment: CRITICAL RESULT CALLED TO, READ BACK BY AND VERIFIED WITH: PHARMD MARY @0829  01/29/20 AKT    Klebsiella oxytoca NOT DETECTED NOT DETECTED Final   Klebsiella pneumoniae NOT DETECTED NOT DETECTED Final   Proteus species NOT DETECTED NOT DETECTED Final   Serratia marcescens NOT DETECTED NOT DETECTED Final   Carbapenem resistance NOT DETECTED NOT  DETECTED Final   Haemophilus influenzae NOT DETECTED NOT DETECTED Final   Neisseria meningitidis NOT DETECTED NOT DETECTED Final   Pseudomonas aeruginosa NOT DETECTED NOT DETECTED Final   Candida albicans NOT DETECTED NOT DETECTED Final   Candida glabrata NOT DETECTED NOT DETECTED Final   Candida krusei NOT DETECTED NOT DETECTED Final   Candida parapsilosis NOT DETECTED NOT DETECTED Final   Candida tropicalis NOT DETECTED NOT DETECTED Final    Comment: Performed at [facility], [place] [address] [place], [place] [address]  Blood Culture (routine x  2)     Status: Abnormal   Collection Time: 01/28/20  5:16 PM   Specimen: BLOOD LEFT HAND  Result Value Ref Range Status   Specimen Description   Final    BLOOD LEFT HAND Performed at [place] [address], [place], [place] [address]    Special Requests   Final    BOTTLES DRAWN AEROBIC AND ANAEROBIC Blood Culture adequate volume Performed at [place] [address] [place], [place] [address]    Culture  Setup Time   Final    GRAM NEGATIVE RODS IN BOTH AEROBIC AND ANAEROBIC BOTTLES CRITICAL VALUE NOTED.  VALUE IS CONSISTENT WITH PREVIOUSLY REPORTED AND CALLED VALUE. Performed at [facility], [place] [address], [place], [place] [address]    Culture ESCHERICHIA COLI (A)  Final   Report Status 02/01/2020 FINAL  Final   Organism ID, Bacteria ESCHERICHIA COLI  Final      Susceptibility   Escherichia coli - MIC*    AMPICILLIN 16 INTERMEDIATE Intermediate     CEFAZOLIN <=4 SENSITIVE Sensitive     CEFEPIME <=0.12 SENSITIVE Sensitive     CEFTAZIDIME <=1 SENSITIVE Sensitive     CEFTRIAXONE <=0.25 SENSITIVE Sensitive     CIPROFLOXACIN <=0.25 SENSITIVE Sensitive     GENTAMICIN <=1 SENSITIVE Sensitive     IMIPENEM <=0.25 SENSITIVE Sensitive     TRIMETH/SULFA <=20 SENSITIVE Sensitive     AMPICILLIN/SULBACTAM <=2 SENSITIVE Sensitive     PIP/TAZO <=4 SENSITIVE Sensitive     * ESCHERICHIA  COLI  Urine culture     Status: Abnormal   Collection Time: 01/28/20  5:54 PM   Specimen: In/Out Cath Urine  Result Value Ref Range Status   Specimen Description   Final    IN/OUT CATH URINE Performed at [facility], [place] [address] [place], [place] [address]    Special Requests   Final    NONE Performed at [facility], [place] [address], [place], [place] [address]    Culture MULTIPLE SPECIES PRESENT, SUGGEST RECOLLECTION (A)  Final   Report Status 01/29/2020 FINAL  Final  SARS CORONAVIRUS 2 (TAT 6-24 HRS) Nasopharyngeal Nasopharyngeal Swab     Status: None   Collection Time: 01/28/20  8:54 PM   Specimen: Nasopharyngeal Swab  Result Value Ref Range Status   SARS Coronavirus 2 NEGATIVE NEGATIVE Final    Comment: (NOTE) SARS-CoV-2 target nucleic acids are NOT DETECTED. The SARS-CoV-2 RNA is generally detectable in upper and lower respiratory specimens during the acute phase of infection. Negative results do not preclude SARS-CoV-2 infection, do not rule out co-infections with other pathogens, and should not be used as the sole basis for treatment or other patient management decisions. Negative results must be combined with clinical observations, patient history, and epidemiological information. The expected result is Negative. Fact Sheet for Patients: SugarRoll.be Fact Sheet for Healthcare Providers: https://www.woods-mathews.com/ This test is not yet approved or cleared by the [place] FDA and  has been authorized for detection and/or diagnosis of SARS-CoV-2 by FDA under an Emergency Use Authorization (EUA). This EUA will remain  in effect (meaning this test can be used) for the duration of the COVID-19 declaration under Section 56 4(b)(1) of the Act, 21 U.S.C. section 360bbb-3(b)(1), unless the authorization is terminated or revoked sooner. Performed at [facility], [place] [address],  [place], [place] [address]   MRSA PCR Screening     Status: None   Collection Time:  01/28/20 11:55 PM   Specimen: Nasal Mucosa; Nasopharyngeal  Result Value Ref Range Status   MRSA by PCR NEGATIVE NEGATIVE Final    Comment:        The GeneXpert MRSA Assay (FDA approved for NASAL specimens only), is one component of a comprehensive MRSA colonization surveillance program. It is not intended to diagnose MRSA infection nor to guide or monitor treatment for MRSA infections. Performed at [facility], [place] [address], [place], [place] [address]   Culture, blood (Routine X 2) w Reflex to ID Panel     Status: None (Preliminary result)   Collection Time: 01/30/20  8:00 AM   Specimen: BLOOD  Result Value Ref Range Status   Specimen Description   Final    BLOOD RIGHT ARM Performed at [place] [address], [place], [place] [address]    Special Requests   Final    BOTTLES DRAWN AEROBIC AND ANAEROBIC Blood Culture adequate volume Performed at [place] [address], [place], [place] [address]    Culture   Final    NO GROWTH 4 DAYS Performed at [facility], [place] [address] [place], [place] [address]    Report Status PENDING  Incomplete         Radiology Studies: No results found.      Scheduled Meds: . bicalutamide  50 mg Oral Daily  . Chlorhexidine Gluconate Cloth  6 each Topical Daily  . feeding supplement  1 Container Oral BID BM  . feeding supplement (ENSURE ENLIVE)  237 mL Oral BID BM  . mouth rinse  15 mL Mouth Rinse BID  . metoprolol tartrate  100 mg Oral BID  . multivitamin with minerals  1 tablet Oral Daily  . sodium chloride flush  3 mL Intravenous Q12H   Continuous Infusions: . ampicillin-sulbactam (UNASYN) IV 3 g (02/04/20 1105)     LOS: 7 days    Time spent: 32 minutes spent on chart review, discussion with nursing staff, consultants, updating family and interview/physical exam;  more than 50% of that time was spent in counseling and/or coordination of care.    Kamori Kitchens J [place] DO [facility] Available via Epic secure chat 7am-7pm After these hours, please refer to coverage provider listed on [email or web address] 02/04/2020, 11:40 AM

## 2020-02-04 NOTE — Telephone Encounter (Signed)
Called pt per 2/24 sch message - no answer. Unable to leave message - vmail full.   Pt should get updated schedule on discharge papers

## 2020-02-05 ENCOUNTER — Ambulatory Visit: Payer: Medicare Other | Admitting: Hematology

## 2020-02-05 MED ORDER — METOPROLOL SUCCINATE ER 100 MG PO TB24: 100.0000 mg | ORAL_TABLET | Freq: Every day | ORAL | 0 refills | Status: AC

## 2020-02-05 MED ORDER — AMOXICILLIN-POT CLAVULANATE 875-125 MG PO TABS: 1.0000 | ORAL_TABLET | Freq: Two times a day (BID) | ORAL | 0 refills | Status: AC

## 2020-02-05 MED ORDER — HYDROCODONE-ACETAMINOPHEN 5-325 MG PO TABS: 1.0000 | ORAL_TABLET | Freq: Four times a day (QID) | ORAL | 0 refills | Status: AC | PRN

## 2020-02-05 MED ORDER — BICALUTAMIDE 50 MG PO TABS: 50.0000 mg | ORAL_TABLET | Freq: Every day | ORAL | 0 refills | Status: AC

## 2020-02-05 MED ORDER — TAMSULOSIN HCL 0.4 MG PO CAPS: 0.4000 mg | ORAL_CAPSULE | Freq: Every day | ORAL | 0 refills | Status: AC

## 2020-02-05 MED ORDER — ADULT MULTIVITAMIN W/MINERALS CH: 1.0000 | ORAL_TABLET | Freq: Every day | ORAL | 0 refills | Status: AC

## 2020-02-05 NOTE — Progress Notes (Signed)
Report called to Vaughan Basta, Therapist, sports.  All questions answered.  Family aware of discharge.  Discharged via [place].

## 2020-02-05 NOTE — Discharge Summary (Signed)
Physician Discharge Summary  Frank Daugherty DGU:[id number] DOB: 10-29-1946 DOA: 01/28/2020  PCP: Patient, No Pcp Per  Admit date: 01/28/2020 Discharge date: 02/05/2020  Admitted From: [facility] Disposition:  [facility]  Recommendations for Outpatient Follow-up:  1. Follow up with PCP in 1-2 weeks 2. Follow-up with urology, Dr. Alinda Money as scheduled 3. Follow-up with medical oncology, Dr. Burr Medico as scheduled 4. Please obtain CMP/CBC in one week 5. Recommend continued discussions regarding goals of care, CODE STATUS, palliative care to follow-up outpatient.  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  In the ED, temperature one 1.5, HR 111, BP 82/68, RR 21, SPO2 94% on room air.  Creatinine 2.42 (was 0.8 on 01/13/2020), calcium 5.5 corrected for albumin of 1.827.3.  Alkaline phosphatase 870, AST 235, ALT 132, total bilirubin 1.4, WBC count 11, hemoglobin 6.7, platelets 135, INR 2.1, urinalysis with large leukoesterase, many bacteria, negative nitrite, greater than 50 WBCs.  Chest x-ray with no acute cardiopulmonary disease process.  Patient was given 2.5 L NS bolus, started on cefepime and vancomycin and 1 unit PRBC ordered for transfusion.  Patient referred for admission for septic shock secondary to urinary source.  Hospital course: Septic shock; present on admission E. coli, Enterococcus septicemia Lactic acidosis Patient presenting from SNF with 2-day history of  worsening mental status, fever, and weakness.  Patient was febrile up to 1-1.5 with a blood pressure of 73/36 on admission.  Elevated lactic acid of 5.8 with underlying AKI and likely shock liver.  Blood cultures positive for Enterococcus and E. coli.  Received 2.5 L NS bolus in ED. Foley catheter exchanged.  Patient was started on broad-spectrum antibiotics initially with vancomycin and cefepime which was deescalated to Unasyn per infectious disease in which he completed a 7-day course of IV antibiotics.  Patient's lactic acid improved from 5.8-1.3, and ID recommended to complete a 14-day course of antibiotics with an additional 7 days of Augmentin.  Repeat blood cultures on 01/30/2020 showed no growth.  Elevated LFTs likely secondary to shock liver Elevated bilirubin Patient was noted to have an elevated AST of 235 and ALT of 132 on presentation with a total bilirubin of 1.4.  Etiology likely secondary to shock liver with significant hypotension in setting of sepsis as above.  Acute hepatitis panel negative.  Patient's LFTs improved gradually from throughout the hospitalization with AST 56, ALT 64 and total bilirubin of 1.0 at time of discharge.  Acute renal failure Creatinine 2.42 on presentation. FeNA 0.1%, consistent with prerenal.  Also suspect possible some ATN secondary to septic shock with hypotension as above.  Complicated by chronic indwelling Foley catheter with obstructive uropathy.  Foley catheter exchanged on 01/29/2020.  Creatinine improved from 2.42-0.93 at time of discharge.  We will continue Foley catheter in place on discharge until follows up with urology outpatient for consideration of a channel TURP.  Paroxysmal atrial fibrillation CHA2DS2-VASc score of 2.  Given history of coagulopathy, risks versus benefits discussions were performed during previous hospitalization with decision to refrain from anticoagulation.  Continue Toprol-XL 100 mg p.o. daily.  Acute on chronic  anemia Baseline hemoglobin 8-10.  Etiology likely secondary to multifactorial with hypoproliferative in the setting of metastatic disease to the bone in addition to hepatic insufficiency.  Hemoglobin on presentation 6.7, most recent prior value of 8.5 on 01/14/2020.  No obvious signs of blood loss/bleeding.  Iron level 22, TIBC 166, ferritin greater than 7500.  Etiology likely secondary to malignancy with bony metastasis with likely marrow infiltration.  Hemoglobin did trend down to 6.7 and received 1 unit PRBC on 01/28/2020.  Hemoglobin had remained stable, 8.9 at time of discharge.  Recommend repeat CBC in 1 week.  Coagulopathy INR elevated 2.2, likely secondary to suspected decreased hepatic synthetic function due to metastatic disease.  No signs of active bleeding.  INR improved to 1.6.  Hypocalcemia Calcium 7.4 with albumin of 1.6, corrected for hypoalbuminemia, Ca = 9.3.  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 Burr Medico.  PSA 1,101.  MR pelvis with IV contrast notable for enhancing tumor mid to apex prostate with invasion of bladder wall consistent with likely prostate primary.  CT chest with no obvious lung mass but with bilateral pleural effusions and diffuse sclerotic osseous metastasis. Medical oncology, urology, palliative care following, appreciate assistance.  Received dose of Lupron; and continues on Casodex.  Foley catheter remain in place per urology for possible channel TURP to relieve bladder outlet obstruction outpatient.  Follow-up with oncology, Dr. Annamaria Boots in 3 weeks following discharge.  Weakness, debility: Continue PT/OT efforts at SNF  Adult failure to thrive Severe protein calorie malnutrition Body mass index is 23.98 kg/m. Nutrition Status: Nutrition Problem: Increased nutrient needs Etiology: chronic illness, cancer and cancer related treatments, acute illness(sepsis/septic shock) Signs/Symptoms:  estimated needs Interventions: Boost Breeze, Ensure Enlive (each supplement provides 350kcal and 20 grams of protein), MVI   Discharge Diagnoses:  Principal Problem:   Severe sepsis with septic shock ([place]) Active Problems:   Coagulopathy (HCC)   Acute lower UTI   Elevated lactic acid level   Acute on chronic anemia   Acute metabolic encephalopathy   AKI (acute kidney injury) ([place])   Hypocalcemia   Transaminitis   Palliative care by specialist   Goals of care, counseling/discussion   General weakness   Prostate cancer metastatic to bone ([place])   E coli bacteremia    Discharge Instructions  Discharge Instructions    Call MD for:  difficulty breathing, headache or visual disturbances   Complete by: As directed    Call MD for:  extreme fatigue   Complete by: As directed    Call MD for:  persistant dizziness or light-headedness   Complete by: As directed    Call MD for:  persistant nausea and vomiting   Complete by: As directed    Call MD for:  severe uncontrolled pain   Complete by: As directed    Call MD for:  temperature >100.4   Complete by: As directed    Diet - low sodium heart healthy   Complete by: As directed    Increase activity slowly   Complete by: As directed      Allergies as of 02/05/2020   No Active Allergies     Medication List    STOP taking these medications   pravastatin 40 MG tablet Commonly known as: PRAVACHOL     TAKE these medications   amoxicillin-clavulanate 875-125 MG tablet Commonly known as: AUGMENTIN Take 1 tablet by mouth every 12 (twelve) hours for 7 days.  ascorbic acid 500 MG tablet Commonly known as: VITAMIN C Take 500 mg by mouth daily.   bicalutamide 50 MG tablet Commonly known as: CASODEX Take 1 tablet (50 mg total) by mouth daily.   HYDROcodone-acetaminophen 5-325 MG tablet Commonly known as: NORCO/VICODIN Take 1 tablet by mouth every 6 (six) hours as needed for moderate pain.   metoprolol succinate 100 MG 24 hr  tablet Commonly known as: TOPROL-XL Take 1 tablet (100 mg total) by mouth daily. Take with or immediately following a meal.   multivitamin with minerals Tabs tablet Take 1 tablet by mouth daily.   tamsulosin 0.4 MG Caps capsule Commonly known as: FLOMAX Take 1 capsule (0.4 mg total) by mouth daily after supper.      Follow-up Information    Raynelle Bring, MD Follow up.   Specialty: Urology Why: Office will call to schedule outpatient follow up for about 2 weeks. Contact information: [place] [address] [phone]        Geralynn Rile, MD. Call in 1 week(s).   Specialties: Internal Medicine, Cardiology, Radiology Contact information: [place] [address] [phone]        Truitt Merle, MD. Schedule an appointment as soon as possible for a visit in 3 week(s).   Specialties: Hematology, Oncology Contact information: [place] [address] [phone]          No Active Allergies  Consultations:  Medical oncology - Dr. Burr Medico  Infectious disease - Dr. Johnnye Sima  Urology - Dr. Alinda Money  Palliative care - Dr. Rowe Pavy   Procedures/Studies: CT CHEST WO CONTRAST  Result Date: 01/29/2020 CLINICAL DATA:  Elevated alkaline phosphatase. 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 Unknown primary. EXAM: CT CHEST WITHOUT CONTRAST TECHNIQUE: Multidetector CT imaging of the chest was performed following the standard protocol without IV contrast. COMPARISON:  Chest radiograph 01/28/2020.  Abdominal MRI 01/06/2020. FINDINGS: Cardiovascular: Mild to moderate motion degradation throughout. Aortic atherosclerosis. Tortuous thoracic aorta. Mild cardiomegaly, without pericardial effusion. Multivessel coronary artery atherosclerosis. Mediastinum/Nodes: No mediastinal or definite hilar adenopathy, given limitations of unenhanced CT. Tiny hiatal hernia. Lungs/Pleura: Small bilateral pleural effusions. Right minor fissure  thickening on 60/7. Bibasilar atelectasis. Areas of smooth septal thickening are mild and most apparent at the lung apices. No dominant pulmonary nodule or mass. Upper Abdomen: Low-density liver lesions or characterized as cysts on prior dedicated MRI. Motion degradation containing in the upper abdomen. Incompletely imaged mildly distended gallbladder. Normal imaged portions of the stomach pancreas, adrenal glands. Musculoskeletal: Mild left gynecomastia. Anasarca. Diffuse sclerotic osseous metastasis. No vertebral body height loss. IMPRESSION: 1. Mild to moderate motion degradation.Given this limitation, no dominant lung mass. 2. Bilateral pleural effusions with mild septal thickening at the apices. Question mild fluid overload/congestive heart failure. 3. Diffuse sclerotic osseous metastasis. This consistent with prostate primary, given PSA level of greater than 1,100 earlier today. 4. Gallbladder distension, incompletely imaged. Electronically Signed   By: Abigail Miyamoto M.D.   On: 01/29/2020 15:59   MR PELVIS W WO CONTRAST  Result Date: 01/29/2020 CLINICAL DATA:  Osseous metastatic disease. Elevated alkaline phosphatase. EXAM: MRI PELVIS WITHOUT AND WITH CONTRAST TECHNIQUE: Multiplanar multisequence MR imaging of the pelvis was performed both before and after administration of intravenous contrast. CONTRAST:  84m GADAVIST GADOBUTROL 1 MMOL/ML IV SOLN COMPARISON:  Abdominal MRI 01/06/2020. FINDINGS: Bones/Joint/Cartilage There is widespread heterogeneous marrow replacement throughout the pelvis, the visualized bilateral proximal femora, and lower lumbar spine. Heterogeneous enhancement throughout the marrow structures. No pathologic  fracture is identified. No enhancing extraosseous soft tissue mass is seen. Trace bilateral hip joint effusions, nonspecific. 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 Pubic symphysis and SI joints are intact. Ligaments Grossly intact. Muscles and Tendons Mild diffuse  intramuscular edema throughout the pelvic and visualized thigh musculature bilaterally. Tendinous structures about the pelvis appear grossly intact. Soft tissues Enlarged, heterogeneous prostate gland measuring 3.8 x 5.3 x 4.4 cm. Enhancing tumor with the mid gland to prostate apex on the left with restricted diffusion (series 12, images 21-22). Findings concerning for invasion of the outer surface of the adjacent urinary bladder (series 13, image 24; series 12, image 20). There is also a adjacent abnormal left internal iliac chain pelvic lymph node (series 13, image 17), likely a site of metastatic disease. Foley catheter is present within the urinary bladder. Trace free fluid within the pelvis. Mild diffuse soft tissue/body wall edema. No soft tissue fluid collections. IMPRESSION: 1. Enhancing tumor with the mid gland to prostate apex on the left with restricted diffusion suggesting high grade tumor. Findings concerning for invasion of the outer surface of the adjacent urinary bladder wall. There is also a adjacent abnormal left internal iliac chain pelvic lymph node, likely a site of metastatic disease. 2. Diffuse heterogeneous marrow replacement throughout the pelvis, visualized bilateral proximal femora, and lower lumbar spine, consistent with widespread osseous metastatic disease. No pathologic fracture. 3. Mild diffuse anasarca and small amount of free fluid within the pelvis. These results will be called to the ordering clinician or representative by the Radiologist Assistant, and communication documented in the PACS or zVision Dashboard. Electronically Signed   By: Davina Poke D.O.   On: 01/29/2020 16:10   DG Chest Port 1 View  Result Date: 01/28/2020 CLINICAL DATA:  Fever. EXAM: PORTABLE CHEST 1 VIEW COMPARISON:  January 05, 2020 FINDINGS: Mild cardiomegaly. The hila and mediastinum are unremarkable. No pneumothorax. No nodules or masses. No focal infiltrates. Mild interstitial prominence without  overt edema. IMPRESSION: Mild interstitial prominence could be due to the portable technique versus pulmonary venous congestion. No focal infiltrates or other acute abnormalities. Electronically Signed   By: Dorise Bullion III M.D   On: 01/28/2020 17:37   CT BONE MARROW BIOPSY & ASPIRATION  Result Date: 01/14/2020 INDICATION: 74 year old with anemia and thrombocytopenia. Abnormal appearance of the bone marrow on MRI. EXAM: CT GUIDED BONE MARROW ASPIRATES AND BIOPSY Physician: Stephan Minister. Anselm Pancoast, MD MEDICATIONS: None. ANESTHESIA/SEDATION: Fentanyl 50 mcg IV; Versed 0.5 mg IV Moderate Sedation Time:  17 minutes The patient was continuously monitored during the procedure by the interventional radiology nurse under my direct supervision. COMPLICATIONS: None immediate. PROCEDURE: The procedure was explained to the patient. The risks and benefits of the procedure were discussed and the patient's questions were addressed. Informed consent was obtained from the patient. The patient was placed prone on CT table. Images of the pelvis were obtained. The right side of back was prepped and draped in sterile fashion. The skin and right posterior ilium were anesthetized with 1% lidocaine. 11 gauge bone needle was directed into the right ilium with CT guidance. Initially, no aspirate could be obtained. Therefore, a core biopsy was obtained. Needle was directed back into the right ilium and approximately 1-2 mL of aspirate could be obtained. Second core biopsy was obtained. Bandage placed over the puncture site. FINDINGS: Pelvic bones are very heterogeneous with scattered areas of sclerosis. Minimal aspirate could be obtained from the bone marrow. Two adequate core biopsies were obtained. IMPRESSION: CT  guided bone marrow aspiration and core biopsy. Electronically Signed   By: Markus Daft M.D.   On: 01/14/2020 13:59   MR ABDOMEN MRCP W WO CONTAST  Result Date: 01/06/2020 CLINICAL DATA:  Painless jaundice and weight loss. EXAM: MRI  ABDOMEN WITHOUT AND WITH CONTRAST (INCLUDING MRCP) TECHNIQUE: Multiplanar multisequence MR imaging of the abdomen was performed both before and after the administration of intravenous contrast. Heavily T2-weighted images of the biliary and pancreatic ducts were obtained, and three-dimensional MRCP images were rendered by post processing. CONTRAST:  85m GADAVIST GADOBUTROL 1 MMOL/ML IV SOLN COMPARISON:  Ultrasound evaluation 01/05/2020 FINDINGS: Lower chest: Assessment of the lung bases is limited on MR. No signs of consolidation or pleural effusion. Hepatobiliary: Numerous hepatic cysts, signs of hepatic iron deposition. Is. Sludge layering in the dependent gallbladder. No signs of filling defect in the biliary tree. Pancreas: Limited assessment due to respiratory motion. No signs of ductal dilation or focal lesion. Arterial phase imaging is showing reasonable quality. Spleen:  Normal size spleen with signs of splenic iron deposition. Adrenals/Urinary Tract: Normal adrenals. Symmetric enhancement of bilateral kidneys. No signs of hydronephrosis. Stomach/Bowel: Visualized gastrointestinal tract is unremarkable with very limited assessment due to respiratory motion and without protocol for bowel assessment on MR. Vascular/Lymphatic: Patent abdominal vasculature. No signs of adenopathy. Other:  Trace ascites about the liver. Musculoskeletal: Very heterogeneous marrow signal with signs of iron deposition in the marrow spaces. Assessment on in and out of phase is limited. Scattered areas of relative restricted diffusion are noted though diffusion is limited in the setting of iron deposition. Heterogeneous patchy marrow space enhancement is noted throughout. IMPRESSION: 1. Marked heterogeneity of the visualized marrow spaces of the spine and pelvis. Areas of restricted diffusion and abnormal enhancement on a background of iron deposition. Correlate with any history of chronic anemia and exogenous iron administration or  transfusions. Findings raise the question of process such as multiple myeloma, perhaps this would explain the elevated alkaline phosphatase which is more pronounced than other hepatic enzyme elevations. 2. Numerous hepatic cysts and signs of hepatic and splenic iron deposition. 3. Trace ascites about the liver. 4. No signs of biliary ductal dilation or filling defect. 5. Sludge layering in a mildly distended gallbladder. Electronically Signed   By: GZetta BillsM.D.   On: 01/06/2020 19:56   ECHOCARDIOGRAM COMPLETE  Result Date: 01/06/2020   ECHOCARDIOGRAM REPORT   Patient Name:   LKNOXX BOEDINGDate of Exam: 01/06/2020 Medical Rec #:  [id number]    Height:       69.8 in Accession #:    [id number]   Weight:       194.5 lb Date of Birth:  1Nov 20, 1947   BSA:          2.06 m Patient Age:    89years      BP:           112/70 mmHg Patient Gender: M             HR:           120 bpm. Exam Location:  Inpatient Procedure: 2D Echo Indications:    Atrial fibrillation  History:        Patient has no prior history of Echocardiogram examinations.                 Risk Factors:Hypertension and Dyslipidemia.  Sonographer:    AClayton LefortRDCS (AE) Referring Phys: [id number] [place] 1. Left  ventricular ejection fraction, by visual estimation, is 55 to 60%. The left ventricle has normal function. There is mildly increased left ventricular hypertrophy.  2. Left ventricular diastolic function could not be evaluated.  3. The left ventricle has no regional wall motion abnormalities.  4. Global right ventricle has normal systolic function.The right ventricular size is normal. No increase in right ventricular wall thickness.  5. Left atrial size was normal.  6. Right atrial size was normal.  7. Mild mitral annular calcification.  8. The mitral valve is grossly normal. Mild mitral valve regurgitation.  9. The tricuspid valve is normal in structure. 10. The tricuspid valve is normal in structure. Tricuspid valve  regurgitation is trivial. 11. The aortic valve is tricuspid. Aortic valve regurgitation is not visualized. 12. The pulmonic valve was not well visualized. Pulmonic valve regurgitation is not visualized. 13. The inferior vena cava is normal in size with greater than 50% respiratory variability, suggesting right atrial pressure of 3 mmHg. FINDINGS  Left Ventricle: Left ventricular ejection fraction, by visual estimation, is 55 to 60%. The left ventricle has normal function. The left ventricle has no regional wall motion abnormalities. There is mildly increased left ventricular hypertrophy. Concentric left ventricular hypertrophy. The left ventricular diastology could not be evaluated due to nondiagnostic images. Left ventricular diastolic function could not be evaluated. Right Ventricle: The right ventricular size is normal. No increase in right ventricular wall thickness. Global RV systolic function is has normal systolic function. Left Atrium: Left atrial size was normal in size. Right Atrium: Right atrial size was normal in size Pericardium: There is no evidence of pericardial effusion. Mitral Valve: The mitral valve is grossly normal. There is mild thickening of the mitral valve leaflet(s). There is mild calcification of the mitral valve leaflet(s). Mild mitral annular calcification. Mild mitral valve regurgitation. Tricuspid Valve: The tricuspid valve is normal in structure. Tricuspid valve regurgitation is trivial. Aortic Valve: The aortic valve is tricuspid. . There is mild thickening and mild calcification of the aortic valve. Aortic valve regurgitation is not visualized. There is mild thickening of the aortic valve. There is mild calcification of the aortic valve. Pulmonic Valve: The pulmonic valve was not well visualized. Pulmonic valve regurgitation is not visualized. Pulmonic regurgitation is not visualized. Aorta: The aortic root, ascending aorta and aortic arch are all structurally normal, with no  evidence of dilitation or obstruction. Pulmonary Artery: The pulmonary artery is not well seen. Venous: The inferior vena cava is normal in size with greater than 50% respiratory variability, suggesting right atrial pressure of 3 mmHg. IAS/Shunts: No atrial level shunt detected by color flow Doppler.  LEFT VENTRICLE PLAX 2D LVIDd:         4.70 cm LVIDs:         3.30 cm LV PW:         1.25 cm LV IVS:        1.40 cm LVOT diam:     2.00 cm LV SV:         58 ml LV SV Index:   27.70 LVOT Area:     3.14 cm  RIGHT VENTRICLE             IVC RV Basal diam:  3.10 cm     IVC diam: 1.20 cm RV S prime:     15.70 cm/s TAPSE (M-mode): 2.3 cm LEFT ATRIUM           Index       RIGHT ATRIUM  Index LA diam:      3.10 cm 1.51 cm/m  RA Area:     15.80 cm LA Vol (A2C): 42.8 ml 20.80 ml/m RA Volume:   40.20 ml  19.54 ml/m LA Vol (A4C): 53.2 ml 25.85 ml/m  AORTIC VALVE LVOT Vmax:   125.00 cm/s LVOT Vmean:  80.740 cm/s LVOT VTI:    0.227 m  AORTA Ao Root diam: 3.30 cm Ao Asc diam:  3.10 cm  SHUNTS Systemic VTI:  0.23 m Systemic Diam: 2.00 cm  Buford Dresser MD Electronically signed by Buford Dresser MD Signature Date/Time: 01/06/2020/1:14:41 PM    Final       Subjective: Patient seen and examined bedside, resting comfortably.  Continues with overall weakness.  Continues with poor appetite.  No other specific complaints this morning.  Denies headache, no fever/chills/night sweats, no nausea/vomiting/diarrhea, no chest pain, palpitations, no shortness of breath, no abdominal pain, no cough/congestion.  No acute events overnight per nursing staff.  Discharge Exam: Vitals:   02/05/20 0458 02/05/20 0845  BP: 131/84 134/89  Pulse: 87 95  Resp: (!) 23   Temp: 97.9 F (36.6 C) 98.9 F (37.2 C)  SpO2: 94% 95%   Vitals:   02/04/20 1327 02/04/20 2150 02/05/20 0458 02/05/20 0845  BP: 125/81 133/90 131/84 134/89  Pulse: 86 94 87 95  Resp: 18 20 (!) 23   Temp: 98.2 F (36.8 C) 97.7 F (36.5 C) 97.9 F  (36.6 C) 98.9 F (37.2 C)  TempSrc: Oral Oral Oral Oral  SpO2: 98% 94% 94% 95%  Weight:      Height:        General: Pt is alert, awake, not in acute distress, thin and cachectic in appearance Cardiovascular: RRR, S1/S2 +, no rubs, no gallops Respiratory: CTA bilaterally, no wheezing, no rhonchi Abdominal: Soft, NT, ND, bowel sounds + Extremities: no edema, no cyanosis    The results of significant diagnostics from this hospitalization (including imaging, microbiology, ancillary and laboratory) are listed below for reference.     Microbiology: Recent Results (from the past 240 hour(s))  Blood Culture (routine x 2)     Status: Abnormal   Collection Time: 01/28/20  5:08 PM   Specimen: BLOOD  Result Value Ref Range Status   Specimen Description   Final    BLOOD RIGHT ANTECUBITAL Performed at [facility] [address], [place], [place] [address]    Special Requests   Final    BOTTLES DRAWN AEROBIC AND ANAEROBIC Blood Culture adequate volume Performed at [facility] [address], [place], [place] [address]    Culture  Setup Time   Final    GRAM NEGATIVE RODS IN BOTH AEROBIC AND ANAEROBIC BOTTLES CRITICAL RESULT CALLED TO, READ BACK BY AND VERIFIED WITH: PHARMD MARY _0  01/29/20 AKT    Culture (A)  Final    ESCHERICHIA COLI SUSCEPTIBILITIES PERFORMED ON PREVIOUS CULTURE WITHIN THE LAST 5 DAYS. Performed at [facility], [place] [address] [place], [place] [address]    Report Status 02/01/2020 FINAL  Final  Blood Culture ID Panel (Reflexed)     Status: Abnormal   Collection Time: 01/28/20  5:08 PM  Result Value Ref Range Status   Enterococcus species DETECTED (A) NOT DETECTED Corrected    Comment: CRITICAL RESULT CALLED TO, READ BACK BY AND VERIFIED WITH: PHARMD MARY _1  01/29/20 AKT CORRECTED ON 02/19 AT 0936: PREVIOUSLY REPORTED AS CRITICAL RESULT CALLED TO, READ BACK BY AND VERIFIED WITH: PHARMD  MARY _0  01/29/20 AKT     Vancomycin resistance NOT DETECTED NOT DETECTED Final   Listeria monocytogenes NOT DETECTED NOT DETECTED Final   Staphylococcus species NOT DETECTED NOT DETECTED Final   Staphylococcus aureus (BCID) NOT DETECTED NOT DETECTED Final   Streptococcus species NOT DETECTED NOT DETECTED Final   Streptococcus agalactiae NOT DETECTED NOT DETECTED Final   Streptococcus pneumoniae NOT DETECTED NOT DETECTED Final   Streptococcus pyogenes NOT DETECTED NOT DETECTED Final   Acinetobacter baumannii NOT DETECTED NOT DETECTED Final   Enterobacteriaceae species DETECTED (A) NOT DETECTED Final    Comment: Enterobacteriaceae represent a large family of gram-negative bacteria, not a single organism. CRITICAL RESULT CALLED TO, READ BACK BY AND VERIFIED WITH: PHARMD MARY _1  01/29/20 AKT    Enterobacter cloacae complex NOT DETECTED NOT DETECTED Final   Escherichia coli DETECTED (A) NOT DETECTED Final    Comment: CRITICAL RESULT CALLED TO, READ BACK BY AND VERIFIED WITH: PHARMD MARY _2  01/29/20 AKT    Klebsiella oxytoca NOT DETECTED NOT DETECTED Final   Klebsiella pneumoniae NOT DETECTED NOT DETECTED Final   Proteus species NOT DETECTED NOT DETECTED Final   Serratia marcescens NOT DETECTED NOT DETECTED Final   Carbapenem resistance NOT DETECTED NOT DETECTED Final   Haemophilus influenzae NOT DETECTED NOT DETECTED Final   Neisseria meningitidis NOT DETECTED NOT DETECTED Final   Pseudomonas aeruginosa NOT DETECTED NOT DETECTED Final   Candida albicans NOT DETECTED NOT DETECTED Final   Candida glabrata NOT DETECTED NOT DETECTED Final   Candida krusei NOT DETECTED NOT DETECTED Final   Candida parapsilosis NOT DETECTED NOT DETECTED Final   Candida tropicalis NOT DETECTED NOT DETECTED Final    Comment: Performed at [facility], [place] [address], [place], [place] [address]  Blood Culture (routine x 2)     Status: Abnormal   Collection Time: 01/28/20  5:16 PM   Specimen: BLOOD LEFT HAND  Result Value  Ref Range Status   Specimen Description   Final    BLOOD LEFT HAND Performed at [facility] [address], [place], [place] [address]    Special Requests   Final    BOTTLES DRAWN AEROBIC AND ANAEROBIC Blood Culture adequate volume Performed at [facility] [address], [place], [place] [address]    Culture  Setup Time   Final    GRAM NEGATIVE RODS IN BOTH AEROBIC AND ANAEROBIC BOTTLES CRITICAL VALUE NOTED.  VALUE IS CONSISTENT WITH PREVIOUSLY REPORTED AND CALLED VALUE. Performed at [facility], [place] [address] [place], [place] [address]    Culture ESCHERICHIA COLI (A)  Final   Report Status 02/01/2020 FINAL  Final   Organism ID, Bacteria ESCHERICHIA COLI  Final      Susceptibility   Escherichia coli - MIC*    AMPICILLIN 16 INTERMEDIATE Intermediate     CEFAZOLIN <=4 SENSITIVE Sensitive     CEFEPIME <=0.12 SENSITIVE Sensitive     CEFTAZIDIME <=1 SENSITIVE Sensitive     CEFTRIAXONE <=0.25 SENSITIVE Sensitive     CIPROFLOXACIN <=0.25 SENSITIVE Sensitive     GENTAMICIN <=1 SENSITIVE Sensitive     IMIPENEM <=0.25 SENSITIVE Sensitive     TRIMETH/SULFA <=20 SENSITIVE Sensitive     AMPICILLIN/SULBACTAM <=2 SENSITIVE Sensitive     PIP/TAZO <=4 SENSITIVE Sensitive     * ESCHERICHIA COLI  Urine culture     Status: Abnormal   Collection Time: 01/28/20  5:54 PM   Specimen: In/Out Cath Urine  Result Value  Ref Range Status   Specimen Description   Final    IN/OUT CATH URINE Performed at [facility] [address] [place], [place] [address]    Special Requests   Final    NONE Performed at [facility], [place] [address] [place], [place] [address]    Culture MULTIPLE SPECIES PRESENT, SUGGEST RECOLLECTION (A)  Final   Report Status 01/29/2020 FINAL  Final  SARS CORONAVIRUS 2 (TAT 6-24 HRS) Nasopharyngeal Nasopharyngeal Swab     Status: None   Collection Time: 01/28/20  8:54 PM   Specimen:  Nasopharyngeal Swab  Result Value Ref Range Status   SARS Coronavirus 2 NEGATIVE NEGATIVE Final    Comment: (NOTE) SARS-CoV-2 target nucleic acids are NOT DETECTED. The SARS-CoV-2 RNA is generally detectable in upper and lower respiratory specimens during the acute phase of infection. Negative results do not preclude SARS-CoV-2 infection, do not rule out co-infections with other pathogens, and should not be used as the sole basis for treatment or other patient management decisions. Negative results must be combined with clinical observations, patient history, and epidemiological information. The expected result is Negative. Fact Sheet for Patients: SugarRoll.be Fact Sheet for Healthcare Providers: https://www.woods-mathews.com/ This test is not yet approved or cleared by the [place] FDA and  has been authorized for detection and/or diagnosis of SARS-CoV-2 by FDA under an Emergency Use Authorization (EUA). This EUA will remain  in effect (meaning this test can be used) for the duration of the COVID-19 declaration under Section 56 4(b)(1) of the Act, 21 U.S.C. section 360bbb-3(b)(1), unless the authorization is terminated or revoked sooner. Performed at [facility], [place] [address] [place], [place] [address]   MRSA PCR Screening     Status: None   Collection Time: 01/28/20 11:55 PM   Specimen: Nasal Mucosa; Nasopharyngeal  Result Value Ref Range Status   MRSA by PCR NEGATIVE NEGATIVE Final    Comment:        The GeneXpert MRSA Assay (FDA approved for NASAL specimens only), is one component of a comprehensive MRSA colonization surveillance program. It is not intended to diagnose MRSA infection nor to guide or monitor treatment for MRSA infections. Performed at [facility], [place] [address] [place], [place] [address]   Culture, blood (Routine X 2) w Reflex to ID Panel     Status: None   Collection Time:  01/30/20  8:00 AM   Specimen: BLOOD  Result Value Ref Range Status   Specimen Description BLOOD RIGHT ARM  Final   Special Requests   Final    BOTTLES DRAWN AEROBIC AND ANAEROBIC Blood Culture adequate volume Performed at [facility] [address] [place] [address], [place], [place] [address]    Culture NO GROWTH 5 DAYS  Final   Report Status 02/04/2020 FINAL  Final  SARS CORONAVIRUS 2 (TAT 6-24 HRS) Nasopharyngeal Nasopharyngeal Swab     Status: None   Collection Time: 02/04/20 10:43 AM   Specimen: Nasopharyngeal Swab  Result Value Ref Range Status   SARS Coronavirus 2 NEGATIVE NEGATIVE Final    Comment: (NOTE) SARS-CoV-2 target nucleic acids are NOT DETECTED. The SARS-CoV-2 RNA is generally detectable in upper and lower respiratory specimens during the acute phase of infection. Negative results do not preclude SARS-CoV-2 infection, do not rule out co-infections with other pathogens, and should not be used as the sole basis for treatment or other patient management decisions. Negative results must be combined with clinical observations, patient history,  and epidemiological information. The expected result is Negative. Fact Sheet for Patients: SugarRoll.be Fact Sheet for Healthcare Providers: https://www.woods-mathews.com/ This test is not yet approved or cleared by the [place] FDA and  has been authorized for detection and/or diagnosis of SARS-CoV-2 by FDA under an Emergency Use Authorization (EUA). This EUA will remain  in effect (meaning this test can be used) for the duration of the COVID-19 declaration under Section 56 4(b)(1) of the Act, 21 U.S.C. section 360bbb-3(b)(1), unless the authorization is terminated or revoked sooner. Performed at [facility], [place] [address] [place], [place] [address]      Labs: BNP (last 3 results) No results for input(s): BNP in the last 8760 hours. Basic Metabolic Panel: Recent  Labs  Lab 01/30/20 0800 01/30/20 0800 01/31/20 0454 02/01/20 0436 02/02/20 0436 02/03/20 0504 02/04/20 0445  NA 142   < > 143 143 141 142 146*  K 3.5   < > 3.5 3.4* 4.0 3.7 3.5  CL 115*   < > 115* 116* 114* 113* 118*  CO2 16*   < > 19* 18* 18* 18* 19*  GLUCOSE 118*   < > 113* 131* 139* 134* 144*  BUN 38*   < > 32* 28* 24* 20 18  CREATININE 1.66*   < > 1.43* 1.36* 1.13 0.99 0.93  CALCIUM 6.1*   < > 6.8* 7.2* 7.3* 7.4* 7.1*  MG 1.8  --  2.0 2.0 1.9  --  1.9   < > = values in this interval not displayed.   Liver Function Tests: Recent Labs  Lab 01/31/20 0454 02/01/20 0436 02/02/20 0436 02/03/20 0504 02/04/20 0445  AST 96* 85* 82* 74* 56*  ALT 96* 89* 85* 77* 64*  ALKPHOS 752* 746* 720* 683* 632*  BILITOT 3.1* 2.9* 1.7* 1.5* 1.0  PROT 5.0* 5.2* 5.0* 5.1* 5.1*  ALBUMIN 1.4* 1.4* 1.6* 1.6* 1.5*   No results for input(s): LIPASE, AMYLASE in the last 168 hours. No results for input(s): AMMONIA in the last 168 hours. CBC: Recent Labs  Lab 01/31/20 0454 02/01/20 0436 02/02/20 0436 02/03/20 0504 02/04/20 0445  WBC 8.9 10.9* 13.1* 16.8* 17.2*  HGB 8.6* 8.6* 8.7* 9.1* 8.9*  HCT 25.8* 26.1* 26.7* 27.6* 27.7*  MCV 83.0 83.7 85.6 85.4 86.6  PLT 77* 71* 63* 60* 65*   Cardiac Enzymes: No results for input(s): CKTOTAL, CKMB, CKMBINDEX, TROPONINI in the last 168 hours. BNP: Invalid input(s): POCBNP CBG: Recent Labs  Lab 01/29/20 2344  GLUCAP 123*   D-Dimer No results for input(s): DDIMER in the last 72 hours. Hgb A1c No results for input(s): HGBA1C in the last 72 hours. Lipid Profile No results for input(s): CHOL, HDL, LDLCALC, TRIG, CHOLHDL, LDLDIRECT in the last 72 hours. Thyroid function studies No results for input(s): TSH, T4TOTAL, T3FREE, THYROIDAB in the last 72 hours.  Invalid input(s): FREET3 Anemia work up No results for input(s): VITAMINB12, FOLATE, FERRITIN, TIBC, IRON, RETICCTPCT in the last 72 hours. Urinalysis    Component Value Date/Time    COLORURINE AMBER (A) 01/28/2020 1754   APPEARANCEUR TURBID (A) 01/28/2020 1754   LABSPEC 1.020 01/28/2020 1754   PHURINE 5.0 01/28/2020 1754   GLUCOSEU NEGATIVE 01/28/2020 1754   HGBUR LARGE (A) 01/28/2020 1754   BILIRUBINUR NEGATIVE 01/28/2020 1754   KETONESUR 5 (A) 01/28/2020 1754   PROTEINUR 100 (A) 01/28/2020 1754   NITRITE NEGATIVE 01/28/2020 1754   LEUKOCYTESUR LARGE (A) 01/28/2020 1754   Sepsis Labs Invalid input(s): PROCALCITONIN,  WBC,  [facility] Recent Results (from the past 240 hour(s))  Blood Culture (routine x 2)     Status: Abnormal   Collection Time: 01/28/20  5:08 PM   Specimen: BLOOD  Result Value Ref Range Status   Specimen Description   Final    BLOOD RIGHT ANTECUBITAL Performed at [facility] [address], [place], [place] [address]    Special Requests   Final    BOTTLES DRAWN AEROBIC AND ANAEROBIC Blood Culture adequate volume Performed at [facility] [address], [place], [place] [address]    Culture  Setup Time   Final    GRAM NEGATIVE RODS IN BOTH AEROBIC AND ANAEROBIC BOTTLES CRITICAL RESULT CALLED TO, READ BACK BY AND VERIFIED WITH: PHARMD MARY _0  01/29/20 AKT    Culture (A)  Final    ESCHERICHIA COLI SUSCEPTIBILITIES PERFORMED ON PREVIOUS CULTURE WITHIN THE LAST 5 DAYS. Performed at [facility], [place] [address] [place], [place] [address]    Report Status 02/01/2020 FINAL  Final  Blood Culture ID Panel (Reflexed)     Status: Abnormal   Collection Time: 01/28/20  5:08 PM  Result Value Ref Range Status   Enterococcus species DETECTED (A) NOT DETECTED Corrected    Comment: CRITICAL RESULT CALLED TO, READ BACK BY AND VERIFIED WITH: PHARMD MARY _1  01/29/20 AKT CORRECTED ON 02/19 AT 0936: PREVIOUSLY REPORTED AS CRITICAL RESULT CALLED TO, READ BACK BY AND VERIFIED WITH: PHARMD MARY _2  01/29/20 AKT    Vancomycin resistance NOT DETECTED NOT DETECTED Final   Listeria  monocytogenes NOT DETECTED NOT DETECTED Final   Staphylococcus species NOT DETECTED NOT DETECTED Final   Staphylococcus aureus (BCID) NOT DETECTED NOT DETECTED Final   Streptococcus species NOT DETECTED NOT DETECTED Final   Streptococcus agalactiae NOT DETECTED NOT DETECTED Final   Streptococcus pneumoniae NOT DETECTED NOT DETECTED Final   Streptococcus pyogenes NOT DETECTED NOT DETECTED Final   Acinetobacter baumannii NOT DETECTED NOT DETECTED Final   Enterobacteriaceae species DETECTED (A) NOT DETECTED Final    Comment: Enterobacteriaceae represent a large family of gram-negative bacteria, not a single organism. CRITICAL RESULT CALLED TO, READ BACK BY AND VERIFIED WITH: PHARMD MARY _3  01/29/20 AKT    Enterobacter cloacae complex NOT DETECTED NOT DETECTED Final   Escherichia coli DETECTED (A) NOT DETECTED Final    Comment: CRITICAL RESULT CALLED TO, READ BACK BY AND VERIFIED WITH: PHARMD MARY _4  01/29/20 AKT    Klebsiella oxytoca NOT DETECTED NOT DETECTED Final   Klebsiella pneumoniae NOT DETECTED NOT DETECTED Final   Proteus species NOT DETECTED NOT DETECTED Final   Serratia marcescens NOT DETECTED NOT DETECTED Final   Carbapenem resistance NOT DETECTED NOT DETECTED Final   Haemophilus influenzae NOT DETECTED NOT DETECTED Final   Neisseria meningitidis NOT DETECTED NOT DETECTED Final   Pseudomonas aeruginosa NOT DETECTED NOT DETECTED Final   Candida albicans NOT DETECTED NOT DETECTED Final   Candida glabrata NOT DETECTED NOT DETECTED Final   Candida krusei NOT DETECTED NOT DETECTED Final   Candida parapsilosis NOT DETECTED NOT DETECTED Final   Candida tropicalis NOT DETECTED NOT DETECTED Final    Comment: Performed at [facility], [place] [address], [place], [place] [address]  Blood Culture (routine x 2)     Status: Abnormal   Collection Time: 01/28/20  5:16 PM   Specimen: BLOOD LEFT HAND  Result Value Ref Range Status   Specimen Description   Final    BLOOD LEFT  HAND  Performed at [facility], [place] [address] [place], [place] [address]    Special Requests   Final    BOTTLES DRAWN AEROBIC AND ANAEROBIC Blood Culture adequate volume Performed at [facility] [address] [place], [place] [address]    Culture  Setup Time   Final    GRAM NEGATIVE RODS IN BOTH AEROBIC AND ANAEROBIC BOTTLES CRITICAL VALUE NOTED.  VALUE IS CONSISTENT WITH PREVIOUSLY REPORTED AND CALLED VALUE. Performed at [facility], [place] [address] [place], [place] [address]    Culture ESCHERICHIA COLI (A)  Final   Report Status 02/01/2020 FINAL  Final   Organism ID, Bacteria ESCHERICHIA COLI  Final      Susceptibility   Escherichia coli - MIC*    AMPICILLIN 16 INTERMEDIATE Intermediate     CEFAZOLIN <=4 SENSITIVE Sensitive     CEFEPIME <=0.12 SENSITIVE Sensitive     CEFTAZIDIME <=1 SENSITIVE Sensitive     CEFTRIAXONE <=0.25 SENSITIVE Sensitive     CIPROFLOXACIN <=0.25 SENSITIVE Sensitive     GENTAMICIN <=1 SENSITIVE Sensitive     IMIPENEM <=0.25 SENSITIVE Sensitive     TRIMETH/SULFA <=20 SENSITIVE Sensitive     AMPICILLIN/SULBACTAM <=2 SENSITIVE Sensitive     PIP/TAZO <=4 SENSITIVE Sensitive     * ESCHERICHIA COLI  Urine culture     Status: Abnormal   Collection Time: 01/28/20  5:54 PM   Specimen: In/Out Cath Urine  Result Value Ref Range Status   Specimen Description   Final    IN/OUT CATH URINE Performed at [facility], [place] [address], [place], [place] [address]    Special Requests   Final    NONE Performed at [facility], [place] [address], [place], [place] [address]    Culture MULTIPLE SPECIES PRESENT, SUGGEST RECOLLECTION (A)  Final   Report Status 01/29/2020 FINAL  Final  SARS CORONAVIRUS 2 (TAT 6-24 HRS) Nasopharyngeal Nasopharyngeal Swab     Status: None   Collection Time: 01/28/20  8:54 PM   Specimen: Nasopharyngeal Swab  Result Value Ref Range Status   SARS Coronavirus  2 NEGATIVE NEGATIVE Final    Comment: (NOTE) SARS-CoV-2 target nucleic acids are NOT DETECTED. The SARS-CoV-2 RNA is generally detectable in upper and lower respiratory specimens during the acute phase of infection. Negative results do not preclude SARS-CoV-2 infection, do not rule out co-infections with other pathogens, and should not be used as the sole basis for treatment or other patient management decisions. Negative results must be combined with clinical observations, patient history, and epidemiological information. The expected result is Negative. Fact Sheet for Patients: SugarRoll.be Fact Sheet for Healthcare Providers: https://www.woods-mathews.com/ This test is not yet approved or cleared by the [place] FDA and  has been authorized for detection and/or diagnosis of SARS-CoV-2 by FDA under an Emergency Use Authorization (EUA). This EUA will remain  in effect (meaning this test can be used) for the duration of the COVID-19 declaration under Section 56 4(b)(1) of the Act, 21 U.S.C. section 360bbb-3(b)(1), unless the authorization is terminated or revoked sooner. Performed at [facility], [place] [address] [place],  [address]   MRSA PCR Screening     Status: None   Collection Time: 01/28/20 11:55 PM   Specimen: Nasal Mucosa; Nasopharyngeal  Result Value Ref Range Status   MRSA by PCR NEGATIVE NEGATIVE Final    Comment:        The GeneXpert MRSA Assay (FDA approved for  NASAL specimens only), is one component of a comprehensive MRSA colonization surveillance program. It is not intended to diagnose MRSA infection nor to guide or monitor treatment for MRSA infections. Performed at [facility], [place] [address] [place], [place] [address]   Culture, blood (Routine X 2) w Reflex to ID Panel     Status: None   Collection Time: 01/30/20  8:00 AM   Specimen: BLOOD  Result Value Ref Range Status    Specimen Description BLOOD RIGHT ARM  Final   Special Requests   Final    BOTTLES DRAWN AEROBIC AND ANAEROBIC Blood Culture adequate volume Performed at [facility] [address], [place], [place] [address]    Culture NO GROWTH 5 DAYS  Final   Report Status 02/04/2020 FINAL  Final  SARS CORONAVIRUS 2 (TAT 6-24 HRS) Nasopharyngeal Nasopharyngeal Swab     Status: None   Collection Time: 02/04/20 10:43 AM   Specimen: Nasopharyngeal Swab  Result Value Ref Range Status   SARS Coronavirus 2 NEGATIVE NEGATIVE Final    Comment: (NOTE) SARS-CoV-2 target nucleic acids are NOT DETECTED. The SARS-CoV-2 RNA is generally detectable in upper and lower respiratory specimens during the acute phase of infection. Negative results do not preclude SARS-CoV-2 infection, do not rule out co-infections with other pathogens, and should not be used as the sole basis for treatment or other patient management decisions. Negative results must be combined with clinical observations, patient history, and epidemiological information. The expected result is Negative. Fact Sheet for Patients: SugarRoll.be Fact Sheet for Healthcare Providers: https://www.woods-mathews.com/ This test is not yet approved or cleared by the [place] FDA and  has been authorized for detection and/or diagnosis of SARS-CoV-2 by FDA under an Emergency Use Authorization (EUA). This EUA will remain  in effect (meaning this test can be used) for the duration of the COVID-19 declaration under Section 56 4(b)(1) of the Act, 21 U.S.C. section 360bbb-3(b)(1), unless the authorization is terminated or revoked sooner. Performed at [facility], [place] [address], [place], [place] [address]      Time coordinating discharge: Over 30 minutes  SIGNED:   Donnamarie Poag [place] DO  [facility] 02/05/2020, 9:26 AM

## 2020-02-05 NOTE — TOC Transition Note (Signed)
Transition of Care [facility]) - CM/SW Discharge Note   Patient Details  Name: Lenyn Weeber MRN: [id number] Date of Birth: 1946-03-25  Transition of Care [facility]) CM/SW Contact:  [name], RN Phone Number: 02/05/2020, 12:11 PM   Clinical Narrative: dc back to [address] today-Jafarr aware-rep Irine Seal aware-going to [facility] [address] [place] for 1:30pm pick up. No further CM needs.        Barriers to Discharge: No Barriers Identified   Patient Goals and CMS Choice        Discharge Placement              Patient chooses bed at: [facility]   Name of family member notified: Jafarr Patient and family notified of of transfer: 02/05/20  Discharge Plan and Services                                     Social Determinants of Health (SDOH) Interventions     Readmission Risk Interventions No flowsheet data found.

## 2020-02-10 ENCOUNTER — Telehealth: Payer: Self-pay

## 2020-02-10 ENCOUNTER — Telehealth: Payer: Self-pay | Admitting: Pharmacist

## 2020-02-10 ENCOUNTER — Other Ambulatory Visit: Payer: Self-pay | Admitting: Hematology

## 2020-02-10 MED ORDER — ABIRATERONE ACETATE 500 MG PO TABS: 1000.0000 mg | ORAL_TABLET | Freq: Every day | ORAL | 0 refills | Status: AC

## 2020-02-10 NOTE — Telephone Encounter (Signed)
Oral Oncology Patient Advocate Encounter  After completing a benefits investigation for Zytiga, Patient only has Medicare A and B.  Applying for manufacturer assistance through [place] and [place] Patient [place] Phone [phone] Fax [phone] 02/10/2020 11:33 AM

## 2020-02-10 NOTE — Telephone Encounter (Signed)
Oral Oncology Pharmacist Encounter  Received new prescription for Zytiga (abiraterone) for the treatment of newly diagnosed metastatic prostate cancer, planned duration until disease progression or unacceptable drug toxicity.  Prescription dose and frequency assessed.   Current medication list in Epic reviewed, two DDIs with abiraterone identified: -Abiraterone may increase the concentration of metoprolol and tamsulosin. Monitor patient for increased adverse effects related to metoprolol and tamsulosin. No baseline dose adjustments needed.  Prescription has been e-scribed to the [facility] for benefits analysis and approval.  Oral Oncology Clinic will continue to follow for insurance authorization, copayment issues, initial counseling and start date.  Darl Pikes, PharmD, BCPS, [facility] Hematology/Oncology Clinical Pharmacist [facility]/HP/AP Oral [facility] [phone]  02/10/2020 10:36 AM

## 2020-02-17 NOTE — Progress Notes (Signed)
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 Slightly distended gallbladder with sludge, but negative for wall thickness, sonographic Murphy, or other features to suggest acute gallbladder disease. Common duct diameter upper limits of normal 2. Slightly echogenic liver with multiple cysts     01/06/2020 Imaging   MRI Abdomen 01/06/20 IMPRESSION: 1. Marked heterogeneity of the visualized marrow spaces of the spine and pelvis. Areas of restricted diffusion and abnormal enhancement on a background of iron deposition. Correlate with any history of chronic anemia and exogenous iron administration or transfusions. Findings raise the question of process such as multiple myeloma, perhaps this would explain the elevated alkaline phosphatase which is more pronounced than other hepatic enzyme elevations. 2. Numerous hepatic cysts and signs of hepatic and splenic iron deposition. 3. Trace ascites about the liver. 4. No signs of biliary ductal dilation or filling defect. 5. Sludge layering in a mildly distended gallbladder.   01/14/2020 Initial Biopsy   DIAGNOSIS: 01/14/20  BONE MARROW, ASPIRATE, CLOT, CORE:  - Metastatic carcinoma, see comment.   PERIPHERAL BLOOD:  - Normocytic anemia.  - Thrombocytopenia.   COMMENT:  The carcinoma is poorly differentiated and the immunoprofile is somewhat  non-specific perhaps due to decalcification. However, given the focal  neuroendocrine and prostate markers, a prostate primary should be  considered.    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 Enhancing tumor with the mid gland to prostate apex on the left with restricted diffusion suggesting high grade tumor. Findings concerning for invasion of the outer surface of the adjacent urinary bladder wall. There is also a adjacent abnormal left internal iliac chain pelvic lymph node, likely a site of metastatic disease. 2. Diffuse heterogeneous marrow replacement throughout the pelvis, visualized bilateral proximal femora, and lower lumbar spine, consistent with widespread osseous metastatic disease. No pathologic fracture. 3. Mild diffuse anasarca and small amount of free fluid within the pelvis.   01/29/2020 Imaging    CT chest 01/29/20 IMPRESSION: 1. Mild to moderate motion degradation.Given this limitation, no dominant lung mass. 2. Bilateral pleural effusions with mild septal thickening at the apices. Question mild fluid overload/congestive heart failure. 3. Diffuse sclerotic osseous metastasis. This consistent with prostate primary, given PSA level of greater than 1,100 earlier today. 4. Gallbladder distension, incompletely imaged.   01/29/2020 Tumor Marker   baseline PSA 1101.59 on 01/29/20    01/30/2020 -  Chemotherapy   Lupron injections q36month and Casodex 563mdaily on 01/30/20    02/26/2020 -  Chemotherapy   Zytiga 100069maily and Prednisone 5mg61marting 02/26/20       CURRENT THERAPY:  Lupron injections q3mon60month Casodex 50mg 28my on 01/30/20 Zytiga 1000mg d41m and Prednisone 5mg sta65mng 02/26/20   INTERVAL HISTORY:  Frank Daugherty for a follow up. He presents to the clinic with his son. His wife was called to be included in visit. [facility] [place] of confusion and cognitive issues. He notes having weight  loss and fatigue. He was seen by urologist yesterday and he had low output so he had catheter  placed. He declined TURP yesterday. However he is fine to proceed with such surgery today.  His wife notes his cognitive function changes through out the day.    REVIEW OF SYSTEMS:   Constitutional: Denies fevers, chills or abnormal weight loss (+) Weakness, poor eating  Eyes: Denies blurriness of vision Ears, nose, mouth, throat, and face: Denies mucositis or sore throat Respiratory: Denies cough, dyspnea or wheezes Cardiovascular: Denies palpitation, chest discomfort or lower extremity swelling Gastrointestinal:  Denies nausea, heartburn or change in bowel habits Skin: Denies abnormal skin rashes Lymphatics: Denies new lymphadenopathy or easy bruising Neurological:Denies numbness, tingling or new weaknesses (+) Cognitive dysfunction and confusion Behavioral/Psych: Mood is stable, no new changes  All other systems were reviewed with the patient and are negative.  MEDICAL HISTORY:  Past Medical History:  Diagnosis Date  . Chronic anemia   . Hypertension     SURGICAL HISTORY: History reviewed. No pertinent surgical history.  I have reviewed the social history and family history with the patient and they are unchanged from previous note.  ALLERGIES:  has no active allergies.  MEDICATIONS:  Current Outpatient Medications  Medication Sig Dispense Refill  . abiraterone acetate (ZYTIGA) 500 MG tablet Take 2 tablets (1,000 mg total) by mouth daily. Take on an empty stomach 1 hour before or 2 hours after a meal. 60 tablet 0  . ascorbic acid (VITAMIN C) 500 MG tablet Take 500 mg by mouth daily.    . bicalutamide (CASODEX) 50 MG tablet Take 1 tablet (50 mg total) by mouth daily. 30 tablet 0  . HYDROcodone-acetaminophen (NORCO/VICODIN) 5-325 MG tablet Take 1 tablet by mouth every 6 (six) hours as needed for moderate pain. 20 tablet 0  . metoprolol succinate (TOPROL-XL) 100 MG 24 hr tablet Take 1 tablet (100 mg total) by mouth daily. Take with or immediately following a meal. 30 tablet 0    . Multiple Vitamin (MULTIVITAMIN WITH MINERALS) TABS tablet Take 1 tablet by mouth daily. 30 tablet 0  . predniSONE 5 MG TBEC Take 5 mg by mouth daily. 30 tablet 0  . tamsulosin (FLOMAX) 0.4 MG CAPS capsule Take 1 capsule (0.4 mg total) by mouth daily after supper. 30 capsule 0   No current facility-administered medications for this visit.    PHYSICAL EXAMINATION: ECOG PERFORMANCE STATUS: 3 - Symptomatic, >50% confined to bed  Vitals:   02/25/20 1037  BP: 102/64  Pulse: 100  Resp: 18  Temp: 98.7 F (37.1 C)  SpO2: 100%   Filed Weights   02/25/20 1037  Weight: 137 lb 4.8 oz (62.3 kg)    GENERAL:alert, no distress and comfortable SKIN: skin color, texture, turgor are normal, no rashes or significant lesions EYES: normal, Conjunctiva are pink and non-injected, sclera clear  NECK: supple, thyroid normal size, non-tender, without nodularity LYMPH:  no palpable lymphadenopathy in the cervical, axillary  LUNGS: clear to auscultation and percussion with normal breathing effort HEART: regular rate & rhythm and no murmurs and no lower extremity edema ABDOMEN:abdomen soft, non-tender and normal bowel sounds Musculoskeletal:no cyanosis of digits and no clubbing  NEURO: alert & oriented x 3 with fluent speech, no focal motor/sensory deficits (+) Slow to speak, mild confusion  LABORATORY DATA:  I have reviewed the data as listed CBC Latest Ref Rng & Units 02/25/2020 02/04/2020 02/03/2020  WBC 4.0 - 10.5 K/uL 10.3 17.2(H) 16.8(H)  Hemoglobin 13.0 -  17.0 g/dL 8.0(L) 8.9(L) 9.1(L)  Hematocrit 39.0 - 52.0 % 25.7(L) 27.7(L) 27.6(L)  Platelets 150 - 400 K/uL 119(L) 65(L) 60(L)     CMP Latest Ref Rng & Units 02/25/2020 02/04/2020 02/03/2020  Glucose 70 - 99 mg/dL 183(H) 144(H) 134(H)  BUN 8 - 23 mg/dL _0 Creatinine 0.61 - 1.24 mg/dL 0.71 0.93 0.99  Sodium 135 - 145 mmol/L 143 146(H) 142  Potassium 3.5 - 5.1 mmol/L 4.0 3.5 3.7  Chloride 98 - 111 mmol/L 110 118(H) 113(H)  CO2 22 - 32  mmol/L 20(L) 19(L) 18(L)  Calcium 8.9 - 10.3 mg/dL 7.5(L) 7.1(L) 7.4(L)  Total Protein 6.5 - 8.1 g/dL 6.3(L) 5.1(L) 5.1(L)  Total Bilirubin 0.3 - 1.2 mg/dL 0.9 1.0 1.5(H)  Alkaline Phos 38 - 126 U/L 1,285(H) 632(H) 683(H)  AST 15 - 41 U/L 46(H) 56(H) 74(H)  ALT 0 - 44 U/L 12 64(H) 77(H)      RADIOGRAPHIC STUDIES: I have personally reviewed the radiological images as listed and agreed with the findings in the report. No results found.   ASSESSMENT & PLAN:  Frank Daugherty is a 74 y.o. male with    1. Prostate Cancer Metastatic to bone, poorly differentiated with neuroendocrine features.  -His 01/06/20 MRI showed diffuse abnormal bone marrow signal in spine and pelvic, which prompt a bone marrow biopsy after MM was ruled out.  -I discussed his Bone Marrow biopsy from 01/14/20 which shows poorly differentiated carcinoma and the immunoprofile is somewhat non-specific perhaps due to decalcification. However, given the focal neuroendocrine and prostate markers, a prostate primary should be considered. -His 01/2020 CT Chest and Pelvic MRI showed enlarging mass of prostate gland with baseline PSA 1101.59 showed with  further evidence of prostate primary cancer. No evidence of malignancy in chest.  -I discussed his cancer is not curable at stage IV but still treatable. I started him on Lupron injections and Casodex 77m daily on 01/30/20.  -case was discussed in [facility] tumor board. Due to his urinary obstruction, his urologist Dr. BAlinda Moneyrecommend TURP but pt declined when he saw Dr. BAlinda Moneyyesterday.  I again discussed the benefit of the procedure, he is agreeable now. -Plan to continue Lupron injection and will start him on Zytiga today, along with prednisone 5 mg daily.  I discussed the benefits and side effects, he agrees to proceed. -Due to the neuroendocrine feature in his initial biopsy, and the diffuse infiltrative bone metastasis, and overall poor clinical presentation, his prostate cancer is likely  very aggressive, and may have small cell component.  Chemotherapy will be considered to better control his disease, unfortunately his performance status remains to be 3, and I do not think he is a candidate for chemotherapy at this point.  -[place] I will [facility]  -f/u as needed in the future.    2. Cognitive Dysfunction and dehydration -Since recent hospitalization he has been having cognitive disjunction with hallucinations, confusion and slow to talk  -I discussed this is likely from his cancer and dehydration. Should improved with treatment.  -Will give IV fluids today, he is agreeable.   3. Low urine output  -He was seen by urologist yesterday and failed his void test. He had low output so he had catheter placed and will continue Casodex 578mdaily.  -He was offered TURP procedure by Dr. BoAlinda Moneyesterfay, but he declined  at yesterday, but today he is agreeable with proceeding.     4. Elevated alkaline phosphatase -Pt tells me that he had hepatitis when he was in college, but his hepatitis panel was negative. He is not a heavy alcohol drinker. -Continues to worsen, now 1,285 (02/25/20)    5. 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  -His HFE gene mutation for hemochromatosis was unremarkable.  -His anemia worsened in hospital and required blood transfusion on 01/15/20. During recent hospitalization for sepsis he required blood transfusion on 02/01/20.  -His Hg has decreased again to 8 (02/25/20), which is borderline need for blood transfusion. May need soon at [facility] place or in our clinic.    4. 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 Thrombocytopenia has improved, plt 119K today (02/25/20)    5. Social Support  -He lives  in [place] alone. He has been at [facility] since hospital discharge.   -He has 2 sons. His son Reyes Ivan lives in [place]. He plans to leave [place] on 03/03/20 and patient plans to stay with [place] for some time before returning home.  -He has Medicare, per patient.     6. Enterococcal bacteriemia with septic shock, secondary to his UTI. -hospitalized on 01/28/20 due to sepsis from bacteremia. He was treated with IV antibiotics, resolved.     PLAN:  -He will start Zytiga 1017m daily tomorrow with Prednisone 575mdaily, we gave him the first bottle of Zytig in clinic today, and I called in prednisone to his pharmacy   -Continue Lupron injections q3m37monthwill give injection today  -[place] [facility]    No problem-specific Assessment & Plan notes found for this encounter.   No orders of the defined types were placed in this encounter.  All questions were answered. The patient knows to call the clinic with any problems, questions or concerns. No barriers to learning was detected. The total time spent in the appointment was 60 minutes, including coordination his care.     YanTruitt MerleD 02/25/2020   I, AmoJoslyn Devonm acting as scribe for YanTruitt MerleD.   I have reviewed the above documentation for accuracy and completeness, and I agree with the above.

## 2020-02-18 ENCOUNTER — Telehealth: Payer: Self-pay

## 2020-02-18 NOTE — Telephone Encounter (Signed)
Mr Qamar son Bonney Leitz and Mr Elmyra Ricks wife phone this am.  They were told my [facility] that MR Zborowski is not eating and drinking.  The family is requesting that Dr. Burr Medico prescribe IVF. I spoke with Dr. Burr Medico she requested I reach out to  [facility] to see if they would give IVF. Otherwise we can arrange for fluids here. 1400 called [facility] to his unit no answer.

## 2020-02-19 ENCOUNTER — Telehealth: Payer: Self-pay | Admitting: *Deleted

## 2020-02-19 NOTE — Telephone Encounter (Signed)
Talked with Michelle/Team Lead at [facility] she reports that pt is drinking & eats what he wants but doesn't eat if he doesn't like.  She states pt knows what he is doing.  She reports that he holds meds in his mouth & then spits out.  She says that he has been evaluated by physician/NP there & they do not feel that he is dehydrated & did not feel that he needed IVF.  She reports that staff has talked with family & explained this but they think he needs the IVF.  Labs reported back to me:  Creat 0.8, BUN 11, Na 135, K+ 4.2, Hgb 8.5 & Hct 24.8 which is better.  Message to Dr Burr Medico.  Sharyn Lull states that if MD wants fluids to send orders to [id number] but asked that they be ordered PRN.

## 2020-02-19 NOTE — Telephone Encounter (Signed)
I called pt's son, regarding their concerns of pt being dehydrated. See my nurse's note today. Per pt's nurse at SNF, he does not appear to be dehydrated and is drinking well, but not eating very well. Pt has f/u appointment with Dr. Alinda Money next Tuesday and me next Thursday, pt's son voiced good understanding and appreciate the call.   Frank Daugherty  02/19/2020

## 2020-02-22 ENCOUNTER — Non-Acute Institutional Stay: Payer: Medicare Other | Admitting: Internal Medicine

## 2020-02-23 ENCOUNTER — Telehealth: Payer: Self-pay

## 2020-02-23 NOTE — Telephone Encounter (Signed)
I spoke with Frank Daugherty from [facility] to confirm transportation has been arranged for Frank Daugherty appt on 02/26/2020 at 1000.  She verified that is has been arranged.

## 2020-02-24 ENCOUNTER — Other Ambulatory Visit: Payer: Self-pay

## 2020-02-25 ENCOUNTER — Inpatient Hospital Stay: Payer: No Typology Code available for payment source

## 2020-02-25 ENCOUNTER — Inpatient Hospital Stay (HOSPITAL_BASED_OUTPATIENT_CLINIC_OR_DEPARTMENT_OTHER): Payer: No Typology Code available for payment source | Admitting: Hematology

## 2020-02-25 ENCOUNTER — Inpatient Hospital Stay: Payer: No Typology Code available for payment source | Attending: Hematology

## 2020-02-25 ENCOUNTER — Telehealth: Payer: Self-pay

## 2020-02-25 ENCOUNTER — Other Ambulatory Visit: Payer: Self-pay

## 2020-02-25 ENCOUNTER — Encounter: Payer: Self-pay | Admitting: Hematology

## 2020-02-25 VITALS — BP 102/64 | HR 100 | Temp 98.7°F | Resp 18 | Ht 71.0 in | Wt 137.3 lb

## 2020-02-25 DIAGNOSIS — C7951 Secondary malignant neoplasm of bone: Secondary | ICD-10-CM

## 2020-02-25 DIAGNOSIS — R443 Hallucinations, unspecified: Secondary | ICD-10-CM | POA: Insufficient documentation

## 2020-02-25 DIAGNOSIS — Z7952 Long term (current) use of systemic steroids: Secondary | ICD-10-CM | POA: Insufficient documentation

## 2020-02-25 DIAGNOSIS — I10 Essential (primary) hypertension: Secondary | ICD-10-CM | POA: Insufficient documentation

## 2020-02-25 DIAGNOSIS — C61 Malignant neoplasm of prostate: Secondary | ICD-10-CM | POA: Insufficient documentation

## 2020-02-25 DIAGNOSIS — Z79899 Other long term (current) drug therapy: Secondary | ICD-10-CM | POA: Insufficient documentation

## 2020-02-25 DIAGNOSIS — R970 Elevated carcinoembryonic antigen [CEA]: Secondary | ICD-10-CM | POA: Diagnosis not present

## 2020-02-25 DIAGNOSIS — Z9221 Personal history of antineoplastic chemotherapy: Secondary | ICD-10-CM | POA: Insufficient documentation

## 2020-02-25 DIAGNOSIS — I4891 Unspecified atrial fibrillation: Secondary | ICD-10-CM | POA: Diagnosis not present

## 2020-02-25 DIAGNOSIS — D638 Anemia in other chronic diseases classified elsewhere: Secondary | ICD-10-CM | POA: Insufficient documentation

## 2020-02-25 DIAGNOSIS — D6959 Other secondary thrombocytopenia: Secondary | ICD-10-CM | POA: Diagnosis not present

## 2020-02-25 DIAGNOSIS — E860 Dehydration: Secondary | ICD-10-CM | POA: Diagnosis not present

## 2020-02-25 LAB — CBC WITH DIFFERENTIAL (CANCER CENTER ONLY)
Abs Immature Granulocytes: 0.58 10*3/uL — ABNORMAL HIGH (ref 0.00–0.07)
Basophils Absolute: 0 10*3/uL (ref 0.0–0.1)
Basophils Relative: 0 %
Eosinophils Absolute: 0 10*3/uL (ref 0.0–0.5)
Eosinophils Relative: 0 %
HCT: 25.7 % — ABNORMAL LOW (ref 39.0–52.0)
Hemoglobin: 8 g/dL — ABNORMAL LOW (ref 13.0–17.0)
Immature Granulocytes: 6 %
Lymphocytes Relative: 32 %
Lymphs Abs: 3.3 10*3/uL (ref 0.7–4.0)
MCH: 28.1 pg (ref 26.0–34.0)
MCHC: 31.1 g/dL (ref 30.0–36.0)
MCV: 90.2 fL (ref 80.0–100.0)
Monocytes Absolute: 0.7 10*3/uL (ref 0.1–1.0)
Monocytes Relative: 7 %
Neutro Abs: 5.7 10*3/uL (ref 1.7–7.7)
Neutrophils Relative %: 55 %
Platelet Count: 119 10*3/uL — ABNORMAL LOW (ref 150–400)
RBC: 2.85 MIL/uL — ABNORMAL LOW (ref 4.22–5.81)
RDW: 19.4 % — ABNORMAL HIGH (ref 11.5–15.5)
WBC Count: 10.3 10*3/uL (ref 4.0–10.5)
nRBC: 8.1 % — ABNORMAL HIGH (ref 0.0–0.2)

## 2020-02-25 LAB — CMP (CANCER CENTER ONLY)
ALT: 12 U/L (ref 0–44)
AST: 46 U/L — ABNORMAL HIGH (ref 15–41)
Albumin: 1.8 g/dL — ABNORMAL LOW (ref 3.5–5.0)
Alkaline Phosphatase: 1285 U/L — ABNORMAL HIGH (ref 38–126)
Anion gap: 13 (ref 5–15)
BUN: 12 mg/dL (ref 8–23)
CO2: 20 mmol/L — ABNORMAL LOW (ref 22–32)
Calcium: 7.5 mg/dL — ABNORMAL LOW (ref 8.9–10.3)
Chloride: 110 mmol/L (ref 98–111)
Creatinine: 0.71 mg/dL (ref 0.61–1.24)
GFR, Est AFR Am: >60 mL/min (ref >60)
GFR, Estimated: >60 mL/min (ref >60)
Glucose, Bld: 183 mg/dL — ABNORMAL HIGH (ref 70–99)
Potassium: 4 mmol/L (ref 3.5–5.1)
Sodium: 143 mmol/L (ref 135–145)
Total Bilirubin: 0.9 mg/dL (ref 0.3–1.2)
Total Protein: 6.3 g/dL — ABNORMAL LOW (ref 6.5–8.1)

## 2020-02-25 LAB — CEA (IN HOUSE-CHCC): CEA (CHCC-In House): 9.11 ng/mL — ABNORMAL HIGH (ref 0.00–5.00)

## 2020-02-25 MED ORDER — PREDNISONE 5 MG PO TBEC: 5.0000 mg | DELAYED_RELEASE_TABLET | Freq: Every day | ORAL | 0 refills | Status: AC

## 2020-02-25 MED ORDER — LEUPROLIDE ACETATE (3 MONTH) 22.5 MG IM KIT: 22.5000 mg | PACK | Freq: Once | INTRAMUSCULAR | Status: DC

## 2020-02-25 MED ORDER — LEUPROLIDE ACETATE (3 MONTH) 22.5 MG ~~LOC~~ KIT
22.5000 mg | PACK | Freq: Once | SUBCUTANEOUS | Status: AC
  Administered 2020-02-25: 22.5 mg via SUBCUTANEOUS
  Filled 2020-02-25: qty 22.5

## 2020-02-25 NOTE — Patient Instructions (Signed)
Leuprolide injection What is this medicine? LEUPROLIDE (loo PROE lide) is a man-made hormone. It is used to treat the symptoms of prostate cancer. This medicine may also be used to treat children with early onset of puberty. It may be used for other hormonal conditions. This medicine may be used for other purposes; ask your health care provider or pharmacist if you have questions. COMMON BRAND NAME(S): Lupron What should I tell my health care provider before I take this medicine? They need to know if you have any of these conditions:  diabetes  heart disease or previous heart attack  high blood pressure  high cholesterol  pain or difficulty passing urine  spinal cord metastasis  stroke  tobacco smoker  an unusual or allergic reaction to leuprolide, benzyl alcohol, other medicines, foods, dyes, or preservatives  pregnant or trying to get pregnant  breast-feeding How should I use this medicine? This medicine is for injection under the skin or into a muscle. You will be taught how to prepare and give this medicine. Use exactly as directed. Take your medicine at regular intervals. Do not take your medicine more often than directed. It is important that you put your used needles and syringes in a special sharps container. Do not put them in a trash can. If you do not have a sharps container, call your pharmacist or healthcare provider to get one. A special MedGuide will be given to you by the pharmacist with each prescription and refill. Be sure to read this information carefully each time. Talk to your pediatrician regarding the use of this medicine in children. While this medicine may be prescribed for children as young as 8 years for selected conditions, precautions do apply. Overdosage: If you think you have taken too much of this medicine contact a poison control center or emergency room at once. NOTE: This medicine is only for you. Do not share this medicine with others. What if  I miss a dose? If you miss a dose, take it as soon as you can. If it is almost time for your next dose, take only that dose. Do not take double or extra doses. What may interact with this medicine? Do not take this medicine with any of the following medications:  chasteberry This medicine may also interact with the following medications:  herbal or dietary supplements, like black cohosh or DHEA  male hormones, like estrogens or progestins and birth control pills, patches, rings, or injections  male hormones, like testosterone This list may not describe all possible interactions. Give your health care provider a list of all the medicines, herbs, non-prescription drugs, or dietary supplements you use. Also tell them if you smoke, drink alcohol, or use illegal drugs. Some items may interact with your medicine. What should I watch for while using this medicine? Visit your doctor or health care professional for regular checks on your progress. During the first week, your symptoms may get worse, but then will improve as you continue your treatment. You may get hot flashes, increased bone pain, increased difficulty passing urine, or an aggravation of nerve symptoms. Discuss these effects with your doctor or health care professional, some of them may improve with continued use of this medicine. Male patients may experience a menstrual cycle or spotting during the first 2 months of therapy with this medicine. If this continues, contact your doctor or health care professional. This medicine may increase blood sugar. Ask your healthcare provider if changes in diet or medicines are needed if   you have diabetes. What side effects may I notice from receiving this medicine? Side effects that you should report to your doctor or health care professional as soon as possible:  allergic reactions like skin rash, itching or hives, swelling of the face, lips, or tongue  breathing problems  chest  pain  depression or memory disorders  pain in your legs or groin  pain at site where injected  severe headache  signs and symptoms of high blood sugar such as being more thirsty or hungry or having to urinate more than normal. You may also feel very tired or have blurry vision  swelling of the feet and legs  visual changes  vomiting Side effects that usually do not require medical attention (report to your doctor or health care professional if they continue or are bothersome):  breast swelling or tenderness  decrease in sex drive or performance  diarrhea  hot flashes  loss of appetite  muscle, joint, or bone pains  nausea  redness or irritation at site where injected  skin problems or acne This list may not describe all possible side effects. Call your doctor for medical advice about side effects. You may report side effects to FDA at 1-800-FDA-1088. Where should I keep my medicine? Keep out of the reach of children. Store below 25 degrees C (77 degrees F). Do not freeze. Protect from light. Do not use if it is not clear or if there are particles present. Throw away any unused medicine after the expiration date. NOTE: This sheet is a summary. It may not cover all possible information. If you have questions about this medicine, talk to your doctor, pharmacist, or health care provider.  2020 Elsevier/Gold Standard (2018-09-25 09:52:48)  

## 2020-02-25 NOTE — Telephone Encounter (Signed)
Oral Oncology Patient Advocate Encounter  Met patient in exam room to complete application for Wynetta Emery and [facility] in an effort to reduce patient's out of pocket expense for Zytiga to $0.  Also gave patient samples to last 30 days.    Application completed and faxed to [phone].   JJPA[employer] patient assistance phone number for follow up is [phone].   This encounter will be updated until final determination.  [place] Patient [place] Phone [phone] Fax [phone] 02/25/2020 11:19 AM

## 2020-02-26 ENCOUNTER — Telehealth: Payer: Self-pay

## 2020-02-26 ENCOUNTER — Telehealth: Payer: Self-pay | Admitting: Hematology

## 2020-02-26 LAB — PSA, TOTAL AND FREE
PSA, Free Pct: >3.8 %
PSA, Free: >50 ng/mL
Prostate Specific Ag, Serum: 1300 ng/mL — ABNORMAL HIGH (ref 0.0–4.0)

## 2020-02-26 NOTE — Telephone Encounter (Signed)
No los per 3/18. 

## 2020-02-26 NOTE — Telephone Encounter (Signed)
I have been unable to reach the nursing unit of Mr Jone.  I have left a message for Dr. Keenan Bachelor to call Dr. Burr Medico on her cell phone, number provided.

## 2020-02-29 ENCOUNTER — Telehealth: Payer: Self-pay

## 2020-02-29 NOTE — Telephone Encounter (Signed)
We received a vm from a nurse, no name given, from [facility] stating the Frank Daugherty will not swallow his zytiga, but keeps them in his mouth.

## 2020-02-29 NOTE — Telephone Encounter (Signed)
Dr. Ernestina Penna last ov note faxed to Dr. Alinda Money via epic

## 2020-03-01 ENCOUNTER — Other Ambulatory Visit: Payer: Self-pay

## 2020-03-01 ENCOUNTER — Inpatient Hospital Stay (HOSPITAL_COMMUNITY)
Admission: EM | Admit: 2020-03-01 | Discharge: 2020-03-10 | DRG: 698 | Disposition: E | Payer: Medicare Other | Attending: Internal Medicine | Admitting: Internal Medicine

## 2020-03-01 ENCOUNTER — Emergency Department (HOSPITAL_COMMUNITY): Payer: Medicare Other

## 2020-03-01 ENCOUNTER — Encounter (HOSPITAL_COMMUNITY): Payer: Self-pay | Admitting: *Deleted

## 2020-03-01 ENCOUNTER — Telehealth: Payer: Self-pay | Admitting: *Deleted

## 2020-03-01 DIAGNOSIS — E870 Hyperosmolality and hypernatremia: Secondary | ICD-10-CM | POA: Diagnosis present

## 2020-03-01 DIAGNOSIS — G92 Toxic encephalopathy: Secondary | ICD-10-CM | POA: Diagnosis present

## 2020-03-01 DIAGNOSIS — I10 Essential (primary) hypertension: Secondary | ICD-10-CM | POA: Diagnosis present

## 2020-03-01 DIAGNOSIS — R748 Abnormal levels of other serum enzymes: Secondary | ICD-10-CM | POA: Diagnosis present

## 2020-03-01 DIAGNOSIS — C7951 Secondary malignant neoplasm of bone: Secondary | ICD-10-CM | POA: Diagnosis present

## 2020-03-01 DIAGNOSIS — E43 Unspecified severe protein-calorie malnutrition: Secondary | ICD-10-CM | POA: Diagnosis present

## 2020-03-01 DIAGNOSIS — R040 Epistaxis: Secondary | ICD-10-CM | POA: Diagnosis not present

## 2020-03-01 DIAGNOSIS — Z66 Do not resuscitate: Secondary | ICD-10-CM | POA: Diagnosis present

## 2020-03-01 DIAGNOSIS — A419 Sepsis, unspecified organism: Secondary | ICD-10-CM | POA: Diagnosis not present

## 2020-03-01 DIAGNOSIS — R6520 Severe sepsis without septic shock: Secondary | ICD-10-CM | POA: Diagnosis present

## 2020-03-01 DIAGNOSIS — D649 Anemia, unspecified: Secondary | ICD-10-CM | POA: Diagnosis present

## 2020-03-01 DIAGNOSIS — Z20822 Contact with and (suspected) exposure to covid-19: Secondary | ICD-10-CM | POA: Diagnosis present

## 2020-03-01 DIAGNOSIS — E860 Dehydration: Secondary | ICD-10-CM

## 2020-03-01 DIAGNOSIS — J690 Pneumonitis due to inhalation of food and vomit: Secondary | ICD-10-CM | POA: Diagnosis present

## 2020-03-01 DIAGNOSIS — Z681 Body mass index (BMI) 19 or less, adult: Secondary | ICD-10-CM | POA: Diagnosis not present

## 2020-03-01 DIAGNOSIS — Y846 Urinary catheterization as the cause of abnormal reaction of the patient, or of later complication, without mention of misadventure at the time of the procedure: Secondary | ICD-10-CM | POA: Diagnosis present

## 2020-03-01 DIAGNOSIS — D6959 Other secondary thrombocytopenia: Secondary | ICD-10-CM | POA: Diagnosis present

## 2020-03-01 DIAGNOSIS — A4151 Sepsis due to Escherichia coli [E. coli]: Secondary | ICD-10-CM | POA: Diagnosis present

## 2020-03-01 DIAGNOSIS — R0600 Dyspnea, unspecified: Secondary | ICD-10-CM

## 2020-03-01 DIAGNOSIS — Z7952 Long term (current) use of systemic steroids: Secondary | ICD-10-CM

## 2020-03-01 DIAGNOSIS — R64 Cachexia: Secondary | ICD-10-CM | POA: Diagnosis present

## 2020-03-01 DIAGNOSIS — N390 Urinary tract infection, site not specified: Secondary | ICD-10-CM | POA: Diagnosis present

## 2020-03-01 DIAGNOSIS — R609 Edema, unspecified: Secondary | ICD-10-CM | POA: Diagnosis present

## 2020-03-01 DIAGNOSIS — I4891 Unspecified atrial fibrillation: Secondary | ICD-10-CM | POA: Diagnosis present

## 2020-03-01 DIAGNOSIS — R509 Fever, unspecified: Secondary | ICD-10-CM | POA: Diagnosis present

## 2020-03-01 DIAGNOSIS — N179 Acute kidney failure, unspecified: Secondary | ICD-10-CM | POA: Diagnosis present

## 2020-03-01 DIAGNOSIS — T83518A Infection and inflammatory reaction due to other urinary catheter, initial encounter: Secondary | ICD-10-CM | POA: Diagnosis present

## 2020-03-01 DIAGNOSIS — R627 Adult failure to thrive: Secondary | ICD-10-CM | POA: Diagnosis present

## 2020-03-01 DIAGNOSIS — C61 Malignant neoplasm of prostate: Secondary | ICD-10-CM | POA: Diagnosis present

## 2020-03-01 DIAGNOSIS — Z515 Encounter for palliative care: Secondary | ICD-10-CM | POA: Diagnosis present

## 2020-03-01 DIAGNOSIS — G9340 Encephalopathy, unspecified: Secondary | ICD-10-CM | POA: Diagnosis not present

## 2020-03-01 DIAGNOSIS — E876 Hypokalemia: Secondary | ICD-10-CM | POA: Diagnosis present

## 2020-03-01 DIAGNOSIS — Z79899 Other long term (current) drug therapy: Secondary | ICD-10-CM

## 2020-03-01 LAB — CBC WITH DIFFERENTIAL/PLATELET
Abs Immature Granulocytes: 0.31 10*3/uL — ABNORMAL HIGH (ref 0.00–0.07)
Basophils Absolute: 0 10*3/uL (ref 0.0–0.1)
Basophils Relative: 0 %
Eosinophils Absolute: 0 10*3/uL (ref 0.0–0.5)
Eosinophils Relative: 0 %
HCT: 20.6 % — ABNORMAL LOW (ref 39.0–52.0)
Hemoglobin: 6.4 g/dL — CL (ref 13.0–17.0)
Immature Granulocytes: 7 %
Lymphocytes Relative: 29 %
Lymphs Abs: 1.3 10*3/uL (ref 0.7–4.0)
MCH: 29.1 pg (ref 26.0–34.0)
MCHC: 31.1 g/dL (ref 30.0–36.0)
MCV: 93.6 fL (ref 80.0–100.0)
Monocytes Absolute: 0.2 10*3/uL (ref 0.1–1.0)
Monocytes Relative: 4 %
Neutro Abs: 2.6 10*3/uL (ref 1.7–7.7)
Neutrophils Relative %: 60 %
Platelets: 80 10*3/uL — ABNORMAL LOW (ref 150–400)
RBC: 2.2 MIL/uL — ABNORMAL LOW (ref 4.22–5.81)
RDW: 19.6 % — ABNORMAL HIGH (ref 11.5–15.5)
WBC: 4.4 10*3/uL (ref 4.0–10.5)
nRBC: 11.9 % — ABNORMAL HIGH (ref 0.0–0.2)

## 2020-03-01 LAB — COMPREHENSIVE METABOLIC PANEL
ALT: 18 U/L (ref 0–44)
AST: 36 U/L (ref 15–41)
Albumin: 1.6 g/dL — ABNORMAL LOW (ref 3.5–5.0)
Alkaline Phosphatase: 752 U/L — ABNORMAL HIGH (ref 38–126)
Anion gap: 12 (ref 5–15)
BUN: 19 mg/dL (ref 8–23)
CO2: 23 mmol/L (ref 22–32)
Calcium: 7.3 mg/dL — ABNORMAL LOW (ref 8.9–10.3)
Chloride: 118 mmol/L — ABNORMAL HIGH (ref 98–111)
Creatinine, Ser: 1.09 mg/dL (ref 0.61–1.24)
GFR calc Af Amer: >60 mL/min (ref >60)
GFR calc non Af Amer: >60 mL/min (ref >60)
Glucose, Bld: 107 mg/dL — ABNORMAL HIGH (ref 70–99)
Potassium: 3.1 mmol/L — ABNORMAL LOW (ref 3.5–5.1)
Sodium: 153 mmol/L — ABNORMAL HIGH (ref 135–145)
Total Bilirubin: 1.9 mg/dL — ABNORMAL HIGH (ref 0.3–1.2)
Total Protein: 5.6 g/dL — ABNORMAL LOW (ref 6.5–8.1)

## 2020-03-01 LAB — URINALYSIS, ROUTINE W REFLEX MICROSCOPIC
Bilirubin Urine: NEGATIVE
Glucose, UA: NEGATIVE mg/dL
Ketones, ur: NEGATIVE mg/dL
Nitrite: NEGATIVE
Protein, ur: 30 mg/dL — AB
Specific Gravity, Urine: 1.015 (ref 1.005–1.030)
pH: 5 (ref 5.0–8.0)

## 2020-03-01 LAB — LACTIC ACID, PLASMA
Lactic Acid, Venous: 2 mmol/L (ref 0.5–1.9)
Lactic Acid, Venous: 1.9 mmol/L (ref 0.5–1.9)

## 2020-03-01 LAB — PROTIME-INR
INR: 1.9 — ABNORMAL HIGH (ref 0.8–1.2)
Prothrombin Time: 21.6 seconds — ABNORMAL HIGH (ref 11.4–15.2)

## 2020-03-01 LAB — SARS CORONAVIRUS 2 (TAT 6-24 HRS): SARS Coronavirus 2: NEGATIVE

## 2020-03-01 LAB — POC SARS CORONAVIRUS 2 AG -  ED: SARS Coronavirus 2 Ag: NEGATIVE

## 2020-03-01 LAB — PREPARE RBC (CROSSMATCH)

## 2020-03-01 MED ORDER — SODIUM CHLORIDE 0.9 % IV BOLUS (SEPSIS)
1000.0000 mL | Freq: Once | INTRAVENOUS | Status: AC
  Administered 2020-03-01: 1000 mL via INTRAVENOUS

## 2020-03-01 MED ORDER — VANCOMYCIN HCL IN DEXTROSE 1-5 GM/200ML-% IV SOLN
1000.0000 mg | Freq: Every day | INTRAVENOUS | Status: DC
  Administered 2020-03-02 – 2020-03-03 (×3): 1000 mg via INTRAVENOUS
  Filled 2020-03-01 (×4): qty 200

## 2020-03-01 MED ORDER — SODIUM CHLORIDE 0.9 % IV SOLN
2.0000 g | Freq: Two times a day (BID) | INTRAVENOUS | Status: DC
  Administered 2020-03-01 – 2020-03-02 (×2): 2 g via INTRAVENOUS
  Filled 2020-03-01 (×2): qty 2

## 2020-03-01 MED ORDER — SODIUM CHLORIDE 0.9 % IV SOLN
2.0000 g | Freq: Once | INTRAVENOUS | Status: AC
  Administered 2020-03-01: 2 g via INTRAVENOUS
  Filled 2020-03-01: qty 2

## 2020-03-01 MED ORDER — POTASSIUM CHLORIDE 10 MEQ/100ML IV SOLN
10.0000 meq | INTRAVENOUS | Status: AC
  Administered 2020-03-01 (×3): 10 meq via INTRAVENOUS
  Filled 2020-03-01 (×3): qty 100

## 2020-03-01 MED ORDER — SODIUM CHLORIDE 0.9% IV SOLUTION: Freq: Once | INTRAVENOUS | Status: AC

## 2020-03-01 NOTE — ED Notes (Signed)
X-ray at bedside

## 2020-03-01 NOTE — H&P (Addendum)
History and Physical  Frank Daugherty [id number] DOB: 1946-09-03 DOA: 03/09/2020  Referring physician: ER provider PCP: Patient, No Pcp Per  Outpatient Specialists:    Patient coming from: Skilled nursing facility  Chief Complaint: Fever and altered mental status  HPI:  Patient is a 74 year old African-American male with past medical history significant for prostate cancer for which patient has chronic indwelling catheter, hypertension and chronic anemia.  Patient is a skilled nursing facility resident.  Patient could not or would not give any history.  Most of the history came from the ER provider and patient's nurse.  Apparently, patient has been failing to thrive in the last 3 weeks.  Earlier today, patient developed fever and altered mentation leading to the admission.  On presentation to the hospital, patient was hypotensive, febrile, with elevated lactic acid and urinalysis suggestive of likely UTI.  Sodium was also noted to be 153 with potassium of 3.1.  Albumin is 1.6, hemoglobin of 6.4 and INR of 1.9.  Serum creatinine has also risen from 0.71 to 1.09.  Patient is currently being worked up and managed for sepsis likely secondary to complicated UTI.  Hospitalist team has been asked to admit patient for further assessment and management.  ED Course: On presentation to the hospital, vitals revealed T-max of 101.1, blood pressure of 80-154/57-131, heart rate of 104 to 122 bpm, respiratory rate of 12 to 36/min and O2 sat of 92 to 100%.  Labs revealed sodium of 153, potassium of 3.1, BUN of 19, serum creatinine of 1.09, alkaline phosphatase of 752, albumin of 1.6, total bilirubin of 1.9, total protein of 5.6 and lactic acid of 2.  It was 4.4, hemoglobin at 6.4 (down from 8 g/dL 5 days ago) and platelet count of 80.  UA revealed specific gravity of1.015, moderate leukocyte, small hemoglobin, many bacteria and WBC of 21-50.  Point-of-care COVID-19 testing came back negative and the PCR is pending.   CT head is negative for acute infarct, mass or hemorrhage.  CT head revealed extensive mastoid air cell disease on the left with mild intracranial arterial vascular calcification and areas of paranasal sinus disease.  Chest x-ray is said to reveal chronic interstitial edema, without acute airspace disease.  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  Pertinent labs: As documented above.  EKG: Independently reviewed.   Imaging: independently reviewed.   Review of Systems:  Unobtainable.  Past Medical History:  Diagnosis Date  . Chronic anemia   . Hypertension     History reviewed. No pertinent surgical history.   reports that he has never smoked. He has never used smokeless tobacco. He reports current alcohol use of about 4.0 standard drinks of alcohol per week. He reports that he does not use drugs.  No Active Allergies  Family History  Family history unknown: Yes     Prior to Admission medications   Medication Sig Start Date End Date Taking? Authorizing Provider  abiraterone acetate (ZYTIGA) 500 MG tablet Take 2 tablets (1,000 mg total) by mouth daily. Take on an empty stomach 1 hour before or 2 hours after a meal. 02/10/20   Truitt Merle, MD  ascorbic acid (VITAMIN C) 500 MG tablet Take 500 mg by mouth daily.    [provider]  bicalutamide (CASODEX) 50 MG tablet Take 1 tablet (50 mg total) by mouth daily. 02/05/20 2020-03-24  [place] Eric J, DO  HYDROcodone-acetaminophen (NORCO/VICODIN) 5-325 MG tablet Take 1 tablet by mouth every 6 (six) hours as needed for moderate pain. 02/05/20 March 24, 2020  [place] Donnamarie Poag, DO  metoprolol succinate (TOPROL-XL) 100 MG 24 hr tablet Take 1 tablet (100 mg total) by mouth daily. Take with or immediately following a meal. 02/05/20 04-02-2020  [place] Donnamarie Poag, DO  Multiple Vitamin (MULTIVITAMIN WITH MINERALS) TABS tablet Take 1 tablet by mouth daily. 02/05/20   [place] Eric J, DO  predniSONE 5 MG TBEC Take 5 mg by mouth daily. 02/25/20   Truitt Merle, MD    tamsulosin (FLOMAX) 0.4 MG CAPS capsule Take 1 capsule (0.4 mg total) by mouth daily after supper. 02/05/20 02-Apr-2020  [place] Eric J, DO    Physical Exam: Vitals:   02/24/2020 1848 02/12/2020 1900 02/21/2020 1904 03/03/2020 1930  BP: 105/62 101/63 101/63 108/60  Pulse: (!) 122  (!) 114   Resp: (!) 26 (!) 30 (!) 29 (!) 27  Temp: 99.4 F (37.4 C)  99.7 F (37.6 C)   TempSrc: Oral  Oral   SpO2: 98% 98% 99% 98%    Constitutional:  . Appears calm and comfortable.  Patient is cachectic. Eyes:  . Pallor.  Tinge of jaundice.    ENMT:  . external ears, nose appear normal.  Dry buccal mucosa Neck:  . Neck is supple. No JVD Respiratory:  . CTA bilaterally, no w/r/r.  . Respiratory effort normal. No retractions or accessory muscle use Cardiovascular:  . S1S2 . No LE extremity edema   Abdomen:  . Abdomen is soft and non tender. Organs are difficult to assess. Neurologic:  . Awake and alert. . Moves all limbs.  Wt Readings from Last 3 Encounters:  02/25/20 62.3 kg  02/04/20 78 kg  01/14/20 72.1 kg    I have personally reviewed following labs and imaging studies  Labs on Admission:  CBC: Recent Labs  Lab 02/25/20 1014 02/21/2020 1327  WBC 10.3 4.4  NEUTROABS 5.7 2.6  HGB 8.0* 6.4*  HCT 25.7* 20.6*  MCV 90.2 93.6  PLT 119* 80*   Basic Metabolic Panel: Recent Labs  Lab 02/25/20 1014 02/19/2020 1327  NA 143 153*  K 4.0 3.1*  CL 110 118*  CO2 20* 23  GLUCOSE 183* 107*  BUN 12 19  CREATININE 0.71 1.09  CALCIUM 7.5* 7.3*   Liver Function Tests: Recent Labs  Lab 02/25/20 1014 02/19/2020 1327  AST 46* 36  ALT 12 18  ALKPHOS 1,285* 752*  BILITOT 0.9 1.9*  PROT 6.3* 5.6*  ALBUMIN 1.8* 1.6*   No results for input(s): LIPASE, AMYLASE in the last 168 hours. No results for input(s): AMMONIA in the last 168 hours. Coagulation Profile: Recent Labs  Lab 03/05/2020 1327  INR 1.9*   Cardiac Enzymes: No results for input(s): CKTOTAL, CKMB, CKMBINDEX, TROPONINI in the last  168 hours. BNP (last 3 results) No results for input(s): PROBNP in the last 8760 hours. HbA1C: No results for input(s): HGBA1C in the last 72 hours. CBG: No results for input(s): GLUCAP in the last 168 hours. Lipid Profile: No results for input(s): CHOL, HDL, LDLCALC, TRIG, CHOLHDL, LDLDIRECT in the last 72 hours. Thyroid Function Tests: No results for input(s): TSH, T4TOTAL, FREET4, T3FREE, THYROIDAB in the last 72 hours. Anemia Panel: No results for input(s): VITAMINB12, FOLATE, FERRITIN, TIBC, IRON, RETICCTPCT in the last 72 hours. Urine analysis:    Component Value Date/Time   COLORURINE AMBER (A) 02/09/2020 1327   APPEARANCEUR CLOUDY (A) 02/27/2020 1327   LABSPEC 1.015 02/24/2020 1327   PHURINE 5.0 03/05/2020 1327   GLUCOSEU NEGATIVE 02/14/2020 1327   HGBUR SMALL (A) 02/12/2020 1327  BILIRUBINUR NEGATIVE 02/22/2020 1327   KETONESUR NEGATIVE 02/12/2020 1327   PROTEINUR 30 (A) 02/16/2020 1327   NITRITE NEGATIVE 03/08/2020 1327   LEUKOCYTESUR MODERATE (A) 02/25/2020 1327   Sepsis Labs: @LABRCNTIP (procalcitonin:4,lacticidven:4) )No results found for this or any previous visit (from the past 240 hour(s)).    Radiological Exams on Admission: CT Head Wo Contrast  Result Date: 02/09/2020 CLINICAL DATA:  Fever with altered mental status. History of bladder carcinoma. EXAM: CT HEAD WITHOUT CONTRAST TECHNIQUE: Contiguous axial images were obtained from the base of the skull through the vertex without intravenous contrast. COMPARISON:  None. FINDINGS: Brain: There is moderate diffuse atrophy. There is no intracranial mass, hemorrhage, extra-axial fluid collection, or midline shift. 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 There is patchy small vessel disease in the centra semiovale bilaterally. No evident acute infarct. Vascular: No hyperdense vessel. There is calcification in each carotid siphon region. Skull: The bony calvarium  appears intact. Sinuses/Orbits: There is opacification of anterior left ethmoid air cell. There is mucosal thickening in the posterior right sphenoid sinus region. Other visualized paranasal sinuses are clear. Visualized orbits appear symmetric bilaterally. Other: There is diffuse opacification of mastoid air cells on the left. Mastoids on the right are clear. IMPRESSION: 1. Atrophy with periventricular small vessel disease. 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 No acute infarct. No mass or hemorrhage. 2.  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 3.  Mild intracranial intracranial arterial vascular calcification. 4.  Areas of paranasal sinus disease. Electronically Signed   By: Lowella Grip III M.D.   On: 02/20/2020 18:00   DG Chest Port 1 View  Result Date: 02/25/2020 CLINICAL DATA:  Altered level of consciousness, fever EXAM: PORTABLE CHEST 1 VIEW COMPARISON:  01/28/2020 FINDINGS: Single frontal view of the chest demonstrates an unremarkable cardiac silhouette. Interstitial prominence unchanged. No airspace disease, effusion, or pneumothorax. Diffuse bony sclerosis consistent with known metastatic prostate cancer. IMPRESSION: 1. Chronic interstitial edema.  No acute airspace disease. 2. Continued widespread bony metastases. Electronically Signed   By: Randa Ngo M.D.   On: 02/14/2020 13:55    EKG: Independently reviewed.   Active Problems:   Sepsis ([place])   Assessment/Plan Sepsis likely secondary to complicated UTI (patient has chronic indwelling Foley catheter): -Admit patient for further assessment and management -Panculture patient -Foley catheter has been changed -IV Vanco and cefepime -Hydrate patient -Follow cultures -Further management depend on hospital course.  Dehydration: -Sodium is 153 -Continue hydrating patient -Monitor pulmonary/respiratory status closely (chest x-ray is said to reveal chronic interstitial edema) -Last echo  revealed, normal EF. -Monitor sodium level, with the goal being to drop by 0.52M EQ per liter per hour.  Hypernatremia: -Volume related -IV fluid as above -Monitor sodium closely  Hypokalemia: -Monitor and replete -Check magnesium level.  Anemia: -Patient has chronic anemia. -Recent worsening of anemia. -Patient has already been transfused with packed red blood cells.  Elevated INR: -Doubt if patient is on any anticoagulation -Elevated INR is not new -Possible factor deficiency -Further management depend on goal of care.  Encephalopathy, likely combined toxic and metabolic: -Etiology is multifactorial -Treat above problems. -Continue to assess closely.  Mild acute kidney injury: -Possibly volume related versus ATN. -May have prognostic significance. -Monitor closely.  For further work-up if no resolution is noted.  Guarded prognosis.  DVT prophylaxis: SCD Code Status: DO NOT RESUSCITATE Family Communication:  Disposition  Plan: Likely back to skilled nursing facility, however, this will depend on hospital course Consults called: None Admission status: Inpatient  Time spent: 75 minutes  Dana Allan, MD  [facility] Pager #: [phone] 7PM-7AM contact night coverage as above  02/09/2020, 8:09 PM

## 2020-03-01 NOTE — Telephone Encounter (Signed)
Son called to let Dr Burr Medico know that Frank Daugherty has been admitted with high fever. Also wanted to make sure she is aware that he is not taking his cancer pills. Is wondering what would be the next steps if he is unable or unwilling to take the pills.

## 2020-03-01 NOTE — Progress Notes (Signed)
Patient pended to [facility] ICU/SD.  Reviewed chart for appropriate nursing assignment and room assignment.  Per chart, pt coming in from SNF with failure to thrive x3 weeks.  Pt is listed as a DNR.  VS have been stable for last 6 hours.  Labs of note Hgb 6.4, and K 3.1.  Due to these findings, called ED charge, to assess if patient could be downgraded.  After a few minutes, I was advised by another nurse on the unit that the ED had called and advised that the doctor wants them in ICU.  I advised the AC.  AC advised to currently hold patient in ED due to limited bed availability and she would assess the situation and advise on how to proceed.  Victor / Care Coordinator / Rapid Response Nurse ICU/SD Unit ([facility]) [place], [facility] Rapid Response Number:  [phone] ICU Charge Nurse Number:  [phone]

## 2020-03-01 NOTE — Progress Notes (Signed)
Pharmacy Antibiotic Note  Frank Daugherty is a 74 y.o. male admitted on 02/08/2020 with sepsis.  Pharmacy has been consulted for vancomycin and cefepime dosing.  Plan:  Vancomycin 1000 mg IV q24 hr (est AUC 470 based on SCr 1.09 [TBW CrCl]; Vd 0.72)  Measure vancomycin AUC at steady state as indicated  SCr q48 while on vanc  Cefepime 2 g IV q12 hr    Temp (24hrs), Avg:100.1 F (37.8 C), Min:99.4 F (37.4 C), Max:101.1 F (38.4 C)  Recent Labs  Lab 02/25/20 1014 02/26/2020 1327 02/27/2020 1527  WBC 10.3 4.4  --   CREATININE 0.71 1.09  --   LATICACIDVEN  --  2.0* 1.9    Estimated Creatinine Clearance: 52.4 mL/min (by C-G formula based on SCr of 1.09 mg/dL).    No Active Allergies  Antimicrobials this admission: 3/23 vancomycin >>  3/23 cefepime >>   Dose adjustments this admission: n/a  Microbiology results: none  Thank you for allowing pharmacy to be a part of this patient's care.  Perian Tedder A 02/19/2020 8:35 PM

## 2020-03-01 NOTE — ED Notes (Signed)
Called lab to review why only part of labs orders are in process

## 2020-03-01 NOTE — ED Notes (Signed)
Blood bank has blood ready for this patient, notified Joelyn Oms

## 2020-03-01 NOTE — ED Triage Notes (Signed)
Pt arrived via [facility] from [facility] and [place], fever X today. Per facility failure to thrive symptoms X 3 weeks.   20G left hand 450 NS given in route. 650 mg tylenol given at facility   Hx COVID test yesterday negative, Cathter in place, bladder cancer. Not on Oxygen at baseline   MOST form at bedside

## 2020-03-01 NOTE — Progress Notes (Signed)
Pharmacy Consult   A consult was received from an ED physician for cefepime per pharmacy dosing.  The patient's profile has been reviewed for ht/wt/allergies/indication/available labs.     A one time order has been placed for cefepime 2 gr IV x1 .  Further antibiotics/pharmacy consults should be ordered by admitting physician if indicated.                       Thank you,   Royetta Asal, PharmD, BCPS 02/29/2020 1:45 PM

## 2020-03-01 NOTE — Telephone Encounter (Signed)
Please let his son know I will see him in [facility] hospital tomorrow, I am out of office today. We will likely discuss hospice if he is not able to take cancer pills.   Truitt Merle MD

## 2020-03-01 NOTE — ED Notes (Signed)
Per Ralene Bathe MD Verbal order to change pts foley catheter

## 2020-03-01 NOTE — ED Provider Notes (Signed)
[facility] DEPT Provider Note   CSN: [id number] Arrival date & time: 02/16/2020  1255     History Chief Complaint  Patient presents with  . Posssible Sepsis  . Failure To Thrive    Frank Daugherty is a 74 y.o. male.  The history is provided by the EMS personnel, the patient, medical records and the nursing home. No language interpreter was used.   Frank Daugherty is a 74 y.o. male who presents to the Emergency Department complaining of failure to thrive and sepsis. He presents the emergency department from [facility] for evaluation of fever starting today. Per report he has had poor oral intake for the last several weeks. Level V caveat due to confusion.  Per NH he has been having difficulty swallowing.  They have been crushing his medications so he can swallow them.  He was been mouthing his pills that are not crushed.  He is eating less.  Yesterday he was weak compared to baseline.  Today he was significantly worse and they could not understand what he was saying.  He had fever to 101.6 today.       Past Medical History:  Diagnosis Date  . Chronic anemia   . Hypertension     Patient Active Problem List   Diagnosis Date Noted  . Palliative care by specialist   . Goals of care, counseling/discussion   . General weakness   . Prostate cancer metastatic to bone ([place])   . E coli bacteremia   . Severe sepsis with septic shock ([place]) 01/28/2020  . Acute lower UTI 01/28/2020  . Elevated lactic acid level 01/28/2020  . Acute on chronic anemia 01/28/2020  . Acute metabolic encephalopathy 99991111  . AKI (acute kidney injury) ([place]) 01/28/2020  . Hypocalcemia 01/28/2020  . Transaminitis 01/28/2020  . 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 Abnormal liver function tests   . Abnormal serum level of alkaline phosphatase   . Abnormal MRI of abdomen   . Atrial fibrillation with RVR ([place]) 01/05/2020  . Renal insufficiency 01/05/2020  .  Coagulopathy ([place]) 01/05/2020  . Hyperglycemia 01/05/2020  . HTN (hypertension) 04/23/2012  . HLD (hyperlipidemia) 04/23/2012    No past surgical history on file.     Family History  Family history unknown: Yes    Social History   Tobacco Use  . Smoking status: Never Smoker  . Smokeless tobacco: Never Used  Substance Use Topics  . Alcohol use: Yes    Alcohol/week: 4.0 standard drinks    Types: 4 Cans of beer per week  . Drug use: Never    Home Medications Prior to Admission medications   Medication Sig Start Date End Date Taking? Authorizing Provider  abiraterone acetate (ZYTIGA) 500 MG tablet Take 2 tablets (1,000 mg total) by mouth daily. Take on an empty stomach 1 hour before or 2 hours after a meal. 02/10/20   Truitt Merle, MD  ascorbic acid (VITAMIN C) 500 MG tablet Take 500 mg by mouth daily.    [provider]  bicalutamide (CASODEX) 50 MG tablet Take 1 tablet (50 mg total) by mouth daily. 02/05/20 03/19/20  [place] Eric J, DO  HYDROcodone-acetaminophen (NORCO/VICODIN) 5-325 MG tablet Take 1 tablet by mouth every 6 (six) hours as needed for moderate pain. 02/05/20 2020/03/19  [place] Donnamarie Poag, DO  metoprolol succinate (TOPROL-XL) 100 MG 24 hr tablet Take 1 tablet (100 mg total) by mouth daily. Take with or immediately following a meal. 02/05/20 2020-03-19  [place]  Donnamarie Poag, DO  Multiple Vitamin (MULTIVITAMIN WITH MINERALS) TABS tablet Take 1 tablet by mouth daily. 02/05/20   [place] Eric J, DO  predniSONE 5 MG TBEC Take 5 mg by mouth daily. 02/25/20   Truitt Merle, MD  tamsulosin (FLOMAX) 0.4 MG CAPS capsule Take 1 capsule (0.4 mg total) by mouth daily after supper. 02/05/20 2020-03-28  [place] Eric J, DO    Allergies    Patient has no active allergies.  Review of Systems   Review of Systems  All other systems reviewed and are negative.   Physical Exam Updated Vital Signs BP 99/67   Pulse (!) 114   Temp (!) 101.1 F (38.4 C) (Rectal)   Resp (!) 29   SpO2 95%    Physical Exam Vitals and nursing note reviewed.  Constitutional:      Appearance: He is well-developed.  HENT:     Head: Normocephalic and atraumatic.     Comments: Dry mucous membranes Cardiovascular:     Rate and Rhythm: Regular rhythm. Tachycardia present.     Heart sounds: No murmur.  Pulmonary:     Effort: Pulmonary effort is normal. No respiratory distress.     Breath sounds: Normal breath sounds. No stridor.  Abdominal:     Palpations: Abdomen is soft.     Tenderness: There is no abdominal tenderness. There is no guarding or rebound.  Genitourinary:    Comments: Foley catheter in place Musculoskeletal:        General: No tenderness.  Skin:    General: Skin is warm and dry.  Neurological:     Mental Status: He is alert.     Comments: Alert.  Dysarthric and unintelligible speech.  Follows commands.  Generalized weakness.    Psychiatric:        Behavior: Behavior normal.     ED Results / Procedures / Treatments   Labs (all labs ordered are listed, but only abnormal results are displayed) Labs Reviewed  COMPREHENSIVE METABOLIC PANEL - Abnormal; Notable for the following components:      Result Value   Sodium 153 (*)    Potassium 3.1 (*)    Chloride 118 (*)    Glucose, Bld 107 (*)    Calcium 7.3 (*)    Total Protein 5.6 (*)    Albumin 1.6 (*)    Alkaline Phosphatase 752 (*)    Total Bilirubin 1.9 (*)    All other components within normal limits  LACTIC ACID, PLASMA - Abnormal; Notable for the following components:   Lactic Acid, Venous 2.0 (*)    All other components within normal limits  CBC WITH DIFFERENTIAL/PLATELET - Abnormal; Notable for the following components:   RBC 2.20 (*)    Hemoglobin 6.4 (*)    HCT 20.6 (*)    RDW 19.6 (*)    Platelets 80 (*)    nRBC 11.9 (*)    Abs Immature Granulocytes 0.31 (*)    All other components within normal limits  PROTIME-INR - Abnormal; Notable for the following components:   Prothrombin Time 21.6 (*)    INR  1.9 (*)    All other components within normal limits  URINALYSIS, ROUTINE W REFLEX MICROSCOPIC - Abnormal; Notable for the following components:   Color, Urine AMBER (*)    APPearance CLOUDY (*)    Hgb urine dipstick SMALL (*)    Protein, ur 30 (*)    Leukocytes,Ua MODERATE (*)    Bacteria, UA MANY (*)    All other  components within normal limits  CULTURE, BLOOD (ROUTINE X 2)  CULTURE, BLOOD (ROUTINE X 2)  SARS CORONAVIRUS 2 (TAT 6-24 HRS)  LACTIC ACID, PLASMA  POC SARS CORONAVIRUS 2 AG -  ED  TYPE AND SCREEN  PREPARE RBC (CROSSMATCH)    EKG None  Radiology DG Chest Port 1 View  Result Date: 02/14/2020 CLINICAL DATA:  Altered level of consciousness, fever EXAM: PORTABLE CHEST 1 VIEW COMPARISON:  01/28/2020 FINDINGS: Single frontal view of the chest demonstrates an unremarkable cardiac silhouette. Interstitial prominence unchanged. No airspace disease, effusion, or pneumothorax. Diffuse bony sclerosis consistent with known metastatic prostate cancer. IMPRESSION: 1. Chronic interstitial edema.  No acute airspace disease. 2. Continued widespread bony metastases. Electronically Signed   By: Randa Ngo M.D.   On: 02/18/2020 13:55    Procedures Procedures (including critical care time) CRITICAL CARE Performed by: Quintella Reichert   Total critical care time: 45 minutes  Critical care time was exclusive of separately billable procedures and treating other patients.  Critical care was necessary to treat or prevent imminent or life-threatening deterioration.  Critical care was time spent personally by me on the following activities: development of treatment plan with patient and/or surrogate as well as nursing, discussions with consultants, evaluation of patient's response to treatment, examination of patient, obtaining history from patient or surrogate, ordering and performing treatments and interventions, ordering and review of laboratory studies, ordering and review of radiographic  studies, pulse oximetry and re-evaluation of patient's condition.  Medications Ordered in ED Medications  0.9 %  sodium chloride infusion (Manually program via Guardrails IV Fluids) (has no administration in time range)  sodium chloride 0.9 % bolus 1,000 mL (0 mLs Intravenous Stopped 02/13/2020 1551)    And  sodium chloride 0.9 % bolus 1,000 mL (0 mLs Intravenous Stopped 02/17/2020 1501)  ceFEPIme (MAXIPIME) 2 g in sodium chloride 0.9 % 100 mL IVPB (0 g Intravenous Stopped 03/07/2020 1431)    ED Course  I have reviewed the triage vital signs and the nursing notes.  Pertinent labs & imaging results that were available during my care of the patient were reviewed by me and considered in my medical decision making (see chart for details).    MDM Rules/Calculators/A&P                     Patient with metastatic prostate cancer with indwelling Foley catheter here for evaluation of fever, altered mental status and decreased oral intake for several days to weeks. He is ill appearing on evaluation. He has very difficult to understand speech but is able to move all extremities without difficulty. He denies any pain. He was febrile and hypotensive on ED arrival and sepsis protocol was initiated for presumed sepsis from urinary tract source. His blood pressure improved after IV fluid hydration. On repeat assessment he denies any pain but he still has difficulty understanding speech. Labs are significant for hyponatremia, dehydration, anemia. He does have a history of recurrent anemia, will transfuse two units prbc for symptomatic anemia. Discussed with patient and son findings of studies and recommendation for admission and transfusion and they are in agreement with treatment plan. Patient care transferred pending CT head admission for ongoing treatment.  Final Clinical Impression(s) / ED Diagnoses Final diagnoses:  Sepsis with acute organ dysfunction without septic shock, due to unspecified organism, unspecified  type ([place])  Dehydration    Rx / DC Orders ED Discharge Orders    None       Ralene Bathe,  Benjamine Mola, MD 02/10/2020 1655

## 2020-03-02 DIAGNOSIS — R6520 Severe sepsis without septic shock: Secondary | ICD-10-CM

## 2020-03-02 DIAGNOSIS — C61 Malignant neoplasm of prostate: Secondary | ICD-10-CM

## 2020-03-02 DIAGNOSIS — G9340 Encephalopathy, unspecified: Secondary | ICD-10-CM

## 2020-03-02 DIAGNOSIS — C7951 Secondary malignant neoplasm of bone: Secondary | ICD-10-CM

## 2020-03-02 DIAGNOSIS — A419 Sepsis, unspecified organism: Secondary | ICD-10-CM

## 2020-03-02 LAB — TYPE AND SCREEN
ABO/RH(D): B NEG
Antibody Screen: NEGATIVE
Unit division: 0
Unit division: 0

## 2020-03-02 LAB — BASIC METABOLIC PANEL
Anion gap: 10 (ref 5–15)
BUN: 19 mg/dL (ref 8–23)
CO2: 19 mmol/L — ABNORMAL LOW (ref 22–32)
Calcium: 7.5 mg/dL — ABNORMAL LOW (ref 8.9–10.3)
Chloride: 122 mmol/L — ABNORMAL HIGH (ref 98–111)
Creatinine, Ser: 0.81 mg/dL (ref 0.61–1.24)
GFR calc Af Amer: >60 mL/min (ref >60)
GFR calc non Af Amer: >60 mL/min (ref >60)
Glucose, Bld: 94 mg/dL (ref 70–99)
Potassium: 3 mmol/L — ABNORMAL LOW (ref 3.5–5.1)
Sodium: 151 mmol/L — ABNORMAL HIGH (ref 135–145)

## 2020-03-02 LAB — BPAM RBC
Blood Product Expiration Date: 202104112359
Blood Product Expiration Date: 202104172359
ISSUE DATE / TIME: 202103231833
ISSUE DATE / TIME: 202103232303
Unit Type and Rh: 1700
Unit Type and Rh: 1700

## 2020-03-02 LAB — PHOSPHORUS: Phosphorus: 2.5 mg/dL (ref 2.5–4.6)

## 2020-03-02 LAB — CBC
HCT: 28.5 % — ABNORMAL LOW (ref 39.0–52.0)
Hemoglobin: 9 g/dL — ABNORMAL LOW (ref 13.0–17.0)
MCH: 28.7 pg (ref 26.0–34.0)
MCHC: 31.6 g/dL (ref 30.0–36.0)
MCV: 90.8 fL (ref 80.0–100.0)
Platelets: 75 10*3/uL — ABNORMAL LOW (ref 150–400)
RBC: 3.14 MIL/uL — ABNORMAL LOW (ref 4.22–5.81)
RDW: 19.2 % — ABNORMAL HIGH (ref 11.5–15.5)
WBC: 5 10*3/uL (ref 4.0–10.5)
nRBC: 6.7 % — ABNORMAL HIGH (ref 0.0–0.2)

## 2020-03-02 LAB — MAGNESIUM: Magnesium: 1.8 mg/dL (ref 1.7–2.4)

## 2020-03-02 LAB — TSH: TSH: 0.72 u[IU]/mL (ref 0.350–4.500)

## 2020-03-02 MED ORDER — POTASSIUM CHLORIDE IN NACL 20-0.9 MEQ/L-% IV SOLN
INTRAVENOUS | Status: DC
  Filled 2020-03-02: qty 1000

## 2020-03-02 MED ORDER — DEXTROSE-NACL 5-0.45 % IV SOLN: INTRAVENOUS | Status: AC

## 2020-03-02 MED ORDER — HYDROCORTISONE NA SUCCINATE PF 100 MG IJ SOLR
50.0000 mg | Freq: Four times a day (QID) | INTRAMUSCULAR | Status: AC
  Administered 2020-03-02 – 2020-03-04 (×7): 50 mg via INTRAVENOUS
  Filled 2020-03-02 (×2): qty 2
  Filled 2020-03-02 (×7): qty 1

## 2020-03-02 MED ORDER — SODIUM CHLORIDE 0.9 % IV SOLN
2.0000 g | Freq: Three times a day (TID) | INTRAVENOUS | Status: DC
  Administered 2020-03-02 – 2020-03-04 (×5): 2 g via INTRAVENOUS
  Filled 2020-03-02 (×7): qty 2

## 2020-03-02 NOTE — Progress Notes (Signed)
PROGRESS NOTE  Frank Daugherty  DOB: [birth year]  PCP: Patient, No Pcp Per HYI:[id number]  DOA: 02/26/2020 Admitted From: SNF  LOS: 1 day   Chief Complaint  Patient presents with  . Posssible Sepsis  . Failure To Thrive   Brief narrative: Patient is a 74 year old African-American male with PMH significant for prostate cancer for which patient has chronic indwelling catheter; also with history of hypertension and chronic anemia.  Patient is a skilled nursing facility resident. Per history, patient has been failing to thrive in the last 3 weeks.  On 3/23, patient was noted to have fever and altered mental status and sent to the ED.  In the ED, patient had fever up to 101.1, blood pressure down at 80s, tachycardic to 120s. Work-up showed normal WBC count, elevated lactic acid level to 2, sodium level 153, potassium 3.1, albumin low at 1.6, hemoglobin low at 6.4, platelet count down to 80s, creatinine at 1.09. Alkaline phosphatase elevated to 752. CT head is negative for acute infarct, mass or hemorrhage.    It also showed extensive mastoid air cell disease on the left with mild intracranial arterial vascular calcification and areas of paranasal sinus disease.   Chest x-ray is said to reveal chronic interstitial edema, without acute airspace disease. 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  Patient was admitted to hospitalist service for further evaluation and management.  Subjective: Patient was seen and examined this morning.  Elderly African-American male, lethargic.  Waiting for bed in ED.  Tries to open eyes on sternal rub.  Unable to have a conversation.  Assessment/Plan: Sepsis likely secondary to complicated UTI Chronic indwelling Foley catheter -Met sepsis criteria on admission. -Culture sent. -No respiratory antibiotic coverage with IV vancomycin and IV cefepime. -On maintenance IV hydration. -Lactic acid level improved to normal  Hypernatremia -Sodium level elevated  to 153. -Switched to fluid this morning to dextrose with half-normal saline at 75 mill per hour. -Last echo revealed, normal EF. -Monitor sodium level.  Monitor urine output.  Hypokalemia: -Potassium level low at 3.1.  Repleted.  Recheck tomorrow.    Acute on chronic anemia -No evidence of active bleeding but hemoglobin was low at 6.4.  1 unit of PRBC transfused.  Hemoglobin improved to 9. -Continue to monitor hemoglobin.  Encephalopathy, likely combined toxic and metabolic: -Patient is very lethargic this morning.  Unclear how much of this is acute.  Monitor for next 24 to 48 hours.  If no improvement, may need to have palliative care conversation with family.  Code Status:  DNR per chart DVT prophylaxis:  SCDs Antimicrobials:  IV cefepime and IV vancomycin Fluid: D5 half NS at 75 mL/h Diet: N.p.o. at this time Mobility: Needs PT eval Family Communication:  None at bedside Discharge plan:  Anticipated date and disposition: More than 2 midnights Barriers: Ongoing work-up for sepsis  Consultants:  None  Antimicrobials: Anti-infectives (From admission, onward)   Start     Dose/Rate Route Frequency Ordered Stop   03/02/20 2200  ceFEPIme (MAXIPIME) 2 g in sodium chloride 0.9 % 100 mL IVPB     2 g 200 mL/hr over 30 Minutes Intravenous Every 8 hours 03/02/20 1226     02/10/2020 2200  ceFEPIme (MAXIPIME) 2 g in sodium chloride 0.9 % 100 mL IVPB  Status:  Discontinued     2 g 200 mL/hr over 30 Minutes Intravenous 2 times daily 02/10/2020 2033 03/02/20 1226   02/11/2020 2045  vancomycin (VANCOCIN) IVPB 1000 mg/200 mL premix  1,000 mg 200 mL/hr over 60 Minutes Intravenous Daily 02/20/2020 2033     02/25/2020 1345  ceFEPIme (MAXIPIME) 2 g in sodium chloride 0.9 % 100 mL IVPB     2 g 200 mL/hr over 30 Minutes Intravenous  Once 03/07/2020 1339 03/03/2020 1431        Code Status: DNR   Diet Order            Diet NPO time specified Except for: Sips with Meds  Diet effective now                Infusions:  . ceFEPime (MAXIPIME) IV    . dextrose 5 % and 0.45% NaCl 75 mL/hr at 03/02/20 0953  . vancomycin Stopped (03/02/20 1158)    Scheduled Meds: . hydrocortisone sod succinate (SOLU-CORTEF) inj  50 mg Intravenous Q6H    PRN meds:    Objective: Vitals:   03/02/20 1130 03/02/20 1200  BP: 118/86 (!) 125/94  Pulse: 97 (!) 105  Resp: 17 16  Temp:    SpO2: 97% 97%    Intake/Output Summary (Last 24 hours) at 03/02/2020 1257 Last data filed at 03/02/2020 0706 Gross per 24 hour  Intake 4775 ml  Output 800 ml  Net 3975 ml   There were no vitals filed for this visit. Weight change:  There is no height or weight on file to calculate BMI.   Physical Exam: General exam: Chronically sick looking elderly African-American male Skin: No rashes, lesions or ulcers. HEENT: Atraumatic, normocephalic, supple neck, no obvious bleeding Lungs: Clear to auscultation bilaterally CVS: Tachycardic, no murmurs GI/Abd soft, nontender, nondistended, bowel sound present CNS: Alert, awake, oriented x3 Psychiatry: Unable to examine due to altered mentation Extremities: No edema, no calf tenderness  Data Review: I have personally reviewed the laboratory data and studies available.  Recent Labs  Lab 02/25/20 1014 02/14/2020 1327 03/02/20 0500  WBC 10.3 4.4 5.0  NEUTROABS 5.7 2.6  --   HGB 8.0* 6.4* 9.0*  HCT 25.7* 20.6* 28.5*  MCV 90.2 93.6 90.8  PLT 119* 80* 75*   Recent Labs  Lab 02/25/20 1014 02/23/2020 1327 03/02/20 0500  NA 143 153* 151*  K 4.0 3.1* 3.0*  CL 110 118* 122*  CO2 20* 23 19*  GLUCOSE 183* 107* 94  BUN '12 19 19  ' CREATININE 0.71 1.09 0.81  CALCIUM 7.5* 7.3* 7.5*  MG  --   --  1.8  PHOS  --   --  2.5   Signed, Terrilee Croak, MD [facility] Pager: [phone] (Secure Chat preferred). 03/02/2020

## 2020-03-02 NOTE — Progress Notes (Signed)
Pharmacy Antibiotic Note  Frank Daugherty is a 74 y.o. male admitted on 03/05/2020 with sepsis.  Pharmacy has been consulted for vancomycin and cefepime dosing.  Today, 03/02/20 -WBC WNL -SCr 0.8, stable & WNL. CrCl ~70 mL/min -Tmax 101.1 F  Plan:  Increase cefepime to 2 g IV q8h  Continue vancomycin 1000 mg IV q24h  Goal vancomycin AUC 400-550. Check vancomycin levels once at steady state if indicated  Follow culture data for ability to de-escalate ABX   Temp (24hrs), Avg:99.7 F (37.6 C), Min:99 F (37.2 C), Max:101.1 F (38.4 C)  Recent Labs  Lab 02/25/20 1014 03/04/2020 1327 02/10/2020 1527 03/02/20 0500  WBC 10.3 4.4  --  5.0  CREATININE 0.71 1.09  --  0.81  LATICACIDVEN  --  2.0* 1.9  --     Estimated Creatinine Clearance: 70.5 mL/min (by C-G formula based on SCr of 0.81 mg/dL).    No Active Allergies  Antimicrobials this admission: 3/23 vancomycin >>  3/23 cefepime >>   Dose adjustments this admission:  3/24: Cefepime 2 g q12h --> 2 g q8h  Microbiology results: 3/23 UCx: Sent 3/23 COVID: Negative 3/23 BCx: [place], PharmD 03/02/2020 12:26 PM

## 2020-03-02 NOTE — Progress Notes (Signed)
Frank Daugherty   DOB:21-Sep-1946   [id number]   YR:[id number]  Oncology follow up note   Subjective: Mr.  Daugherty is well-known to me, under my care for his metastatic prostate cancer.  I saw him in my office last week, he presented to the emergency room with fever, hypotension, and confusion.  His fever and hypotension has resolved after IV fluids and broad-spectrum antibiotics, he still intermittently confused, he is n.p.o. now.  Per his nurse at SNF, patient has not been eating or drinking much, and was not able to swallow pills.    Objective:  Vitals:   03/02/20 1600 03/02/20 1630  BP: (!) 124/96 (!) 129/92  Pulse: 100 96  Resp: 17 17  Temp:    SpO2: 95% 98%    There is no height or weight on file to calculate BMI.  Intake/Output Summary (Last 24 hours) at 03/02/2020 1702 Last data filed at 03/02/2020 0706 Gross per 24 hour  Intake 2675 ml  Output 800 ml  Net 1875 ml     Sclerae unicteric  Oropharynx clear  No peripheral adenopathy  Lungs clear -- no rales or rhonchi  Heart regular rate and rhythm  Abdomen benign  MSK no focal spinal tenderness, no peripheral edema  Neuro nonfocal, drowsy    CBG (last 3)  No results for input(s): GLUCAP in the last 72 hours.   Labs:  Urine Studies No results for input(s): UHGB, CRYS in the last 72 hours.  Invalid input(s): UACOL, UAPR, USPG, UPH, UTP, UGL, UKET, UBIL, UNIT, UROB, ULEU, UEPI, UWBC, URBC, UBAC, CAST, [place], [place]  Basic Metabolic Panel: Recent Labs  Lab 02/25/20 1014 02/25/20 1014 02/19/2020 1327 03/02/20 0500  NA 143  --  153* 151*  K 4.0   < > 3.1* 3.0*  CL 110  --  118* 122*  CO2 20*  --  23 19*  GLUCOSE 183*  --  107* 94  BUN 12  --  19 19  CREATININE 0.71  --  1.09 0.81  CALCIUM 7.5*  --  7.3* 7.5*  MG  --   --   --  1.8  PHOS  --   --   --  2.5   < > = values in this interval not displayed.   GFR Estimated Creatinine Clearance: 70.5 mL/min (by C-G formula based on SCr of 0.81 mg/dL). Liver  Function Tests: Recent Labs  Lab 02/25/20 1014 02/09/2020 1327  AST 46* 36  ALT 12 18  ALKPHOS 1,285* 752*  BILITOT 0.9 1.9*  PROT 6.3* 5.6*  ALBUMIN 1.8* 1.6*   No results for input(s): LIPASE, AMYLASE in the last 168 hours. No results for input(s): AMMONIA in the last 168 hours. Coagulation profile Recent Labs  Lab 02/20/2020 1327  INR 1.9*    CBC: Recent Labs  Lab 02/25/20 1014 02/29/2020 1327 03/02/20 0500  WBC 10.3 4.4 5.0  NEUTROABS 5.7 2.6  --   HGB 8.0* 6.4* 9.0*  HCT 25.7* 20.6* 28.5*  MCV 90.2 93.6 90.8  PLT 119* 80* 75*   Cardiac Enzymes: No results for input(s): CKTOTAL, CKMB, CKMBINDEX, TROPONINI in the last 168 hours. BNP: Invalid input(s): POCBNP CBG: No results for input(s): GLUCAP in the last 168 hours. D-Dimer No results for input(s): DDIMER in the last 72 hours. Hgb A1c No results for input(s): HGBA1C in the last 72 hours. Lipid Profile No results for input(s): CHOL, HDL, LDLCALC, TRIG, CHOLHDL, LDLDIRECT in the last 72 hours. Thyroid function studies Recent Labs  03/02/20 0500  TSH 0.720   Anemia work up No results for input(s): VITAMINB12, FOLATE, FERRITIN, TIBC, IRON, RETICCTPCT in the last 72 hours. Microbiology Recent Results (from the past 240 hour(s))  SARS CORONAVIRUS 2 (TAT 6-24 HRS) Nasopharyngeal Nasopharyngeal Swab     Status: None   Collection Time: 03/05/2020  3:48 PM   Specimen: Nasopharyngeal Swab  Result Value Ref Range Status   SARS Coronavirus 2 NEGATIVE NEGATIVE Final    Comment: (NOTE) SARS-CoV-2 target nucleic acids are NOT DETECTED. The SARS-CoV-2 RNA is generally detectable in upper and lower respiratory specimens during the acute phase of infection. Negative results do not preclude SARS-CoV-2 infection, do not rule out co-infections with other pathogens, and should not be used as the sole basis for treatment or other patient management decisions. Negative results must be combined with clinical  observations, patient history, and epidemiological information. The expected result is Negative. Fact Sheet for Patients: SugarRoll.be Fact Sheet for Healthcare Providers: https://www.woods-mathews.com/ This test is not yet approved or cleared by the [place] FDA and  has been authorized for detection and/or diagnosis of SARS-CoV-2 by FDA under an Emergency Use Authorization (EUA). This EUA will remain  in effect (meaning this test can be used) for the duration of the COVID-19 declaration under Section 56 4(b)(1) of the Act, 21 U.S.C. section 360bbb-3(b)(1), unless the authorization is terminated or revoked sooner. Performed at [facility], [place] [address], [place], [place] [address]       Studies:  CT Head Wo Contrast  Result Date: 02/29/2020 CLINICAL DATA:  Fever with altered mental status. History of bladder carcinoma. EXAM: CT HEAD WITHOUT CONTRAST TECHNIQUE: Contiguous axial images were obtained from the base of the skull through the vertex without intravenous contrast. COMPARISON:  None. FINDINGS: Brain: There is moderate diffuse atrophy. There is no intracranial mass, hemorrhage, extra-axial fluid collection, or midline shift. 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 There is patchy small vessel disease in the centra semiovale bilaterally. No evident acute infarct. Vascular: No hyperdense vessel. There is calcification in each carotid siphon region. Skull: The bony calvarium appears intact. Sinuses/Orbits: There is opacification of anterior left ethmoid air cell. There is mucosal thickening in the posterior right sphenoid sinus region. Other visualized paranasal sinuses are clear. Visualized orbits appear symmetric bilaterally. Other: There is diffuse opacification of mastoid air cells on the left. Mastoids on the right are clear. IMPRESSION: 1. Atrophy with periventricular  small vessel disease. 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 No acute infarct. No mass or hemorrhage. 2.  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 3.  Mild intracranial intracranial arterial vascular calcification. 4.  Areas of paranasal sinus disease. Electronically Signed   By: Lowella Grip III M.D.   On: 02/20/2020 18:00   DG Chest Port 1 View  Result Date: 03/05/2020 CLINICAL DATA:  Altered level of consciousness, fever EXAM: PORTABLE CHEST 1 VIEW COMPARISON:  01/28/2020 FINDINGS: Single frontal view of the chest demonstrates an unremarkable cardiac silhouette. Interstitial prominence unchanged. No airspace disease, effusion, or pneumothorax. Diffuse bony sclerosis consistent with known metastatic prostate cancer. IMPRESSION: 1. Chronic interstitial edema.  No acute airspace disease. 2. Continued widespread bony metastases. Electronically Signed   By: Randa Ngo M.D.   On: 02/08/2020 13:55    Assessment: 74 y.o. with recently diagnosed metastatic breast cancer to bone, atrial fibrillation, hypertension, was admitted  for sepsis and failure to thrive.  1.  Sepsis likely secondary to complicated UTI, patient has chronic indwelling Foley 2.  Metabolic encephalopathy 3.  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  Worsening anemia and moderate thrombocytopenia, secondary to #3, s/p blood transfusion  5.  Hypernatremia and hypokalemia 6.  Severe protein and calorie malnutrition 7. Failure to thrive   Plan:  -Agree with IV fluids and broad antibiotics, his fever and hypotension has resolved -He was supposed to start Zytiga and prednisone last week for his prostate cancer, unfortunately he was not able to swallow the pills, per nursing home nurse -He has clearly deteriorated over the past month, with frequent hospital admissions for urosepsis, weight loss, and worsening overall condition. -He is clearly not a candidate for cytotoxic chemotherapy for his  prostate cancer, he has received 2 doses of Lupron, has not had clinical response, his PSA last week continued to arise.  If he is not able to take Zytiga, his condition is likely going to deteriorate -I called his son and his ex-wife and spoke with both of them on the phone.  Given the overall rapid deterioration, incurable and aggressive nature of his prostate cancer, I recommend hospice at this point, they are in agreement.  -His ex-wife is not able to take care of him at home.  Both his son and ex-wife live in [place], please contact hospice in [place], especially residential hospice, or SNF who will take hospice service, and transition him to comfort care -he is DNR -please consider speech evaluation if he can swallow safely  -consult palliative care -I will f/u     Truitt Merle, MD 03/02/2020  5:02 PM

## 2020-03-02 NOTE — ED Notes (Signed)
ED TO INPATIENT HANDOFF REPORT  Name/Age/Gender Frank Daugherty 74 y.o. male  Code Status    Code Status Orders  (From admission, onward)         Start     Ordered   03/02/20 0449  Do not attempt resuscitation (DNR)  Continuous    Question Answer Comment  In the event of cardiac or respiratory ARREST Do not call a "code blue"   In the event of cardiac or respiratory ARREST Do not perform Intubation, CPR, defibrillation or ACLS   In the event of cardiac or respiratory ARREST Use medication by any route, position, wound care, and other measures to relive pain and suffering. May use oxygen, suction and manual treatment of airway obstruction as needed for comfort.      03/02/20 0448        Code Status History    Date Active Date Inactive Code Status Order ID Comments User Context   02/02/2020 1006 02/05/2020 1923 Partial Code [id number]  [place] J, [place] Inpatient   01/28/2020 2151 02/02/2020 1005 Full Code [id number]  Rhetta Mura, DO ED   01/05/2020 2201 01/14/2020 2251 Full Code [id number]  Vianne Bulls, MD Inpatient   Advance Care Planning Activity    Advance Directive Documentation     Most Recent Value  Type of Advance Directive  Out of facility DNR (pink MOST or yellow form)  Pre-existing out of facility DNR order (yellow form or pink MOST form)  --  "MOST" Form in Place?  --      Home/SNF/Other Skilled nursing facility  Chief Complaint Sepsis [facility]) [A41.9]  Level of Care/Admitting Diagnosis ED Disposition    ED Disposition Condition [place] [100102]  Level of Care: Med-Surg [16]  May admit patient to [facility] or [facility] if equivalent level of care is available:: Yes  Covid Evaluation: Asymptomatic Screening Protocol (No Symptoms)  Diagnosis: Sepsis [facility]) [[id number]]  Admitting Physician: Shirlean Mylar  Attending Physician: Dana Allan I [[id number]]  Estimated length of stay: 5 - 7  days  Certification:: I certify this patient will need inpatient services for at least 2 midnights       Medical History Past Medical History:  Diagnosis Date  . Chronic anemia   . Hypertension     Allergies No Active Allergies  IV Location/Drains/Wounds Patient Lines/Drains/Airways Status   Active Line/Drains/Airways    Name:   Placement date:   Placement time:   Site:   Days:   Peripheral IV 02/16/2020 Right Antecubital   02/15/2020    1344    Antecubital   1   Peripheral IV 02/13/2020 Left Wrist   02/17/2020    1200    Wrist   1   Urethral Catheter Carroll Kinds RN Straight-tip 16 Fr.   01/12/20    1405    Straight-tip   50   Incision (Closed) 01/14/20 Back Left;Lower;Mid   01/14/20    1006     48          Labs/Imaging Results for orders placed or performed during the hospital encounter of 02/12/2020 (from the past 48 hour(s))  Comprehensive metabolic panel     Status: Abnormal   Collection Time: 02/12/2020  1:27 PM  Result Value Ref Range   Sodium 153 (H) 135 - 145 mmol/L   Potassium 3.1 (L) 3.5 - 5.1 mmol/L   Chloride 118 (H) 98 - 111 mmol/L  CO2 23 22 - 32 mmol/L   Glucose, Bld 107 (H) 70 - 99 mg/dL    Comment: Glucose reference range applies only to samples taken after fasting for at least 8 hours.   BUN 19 8 - 23 mg/dL   Creatinine, Ser 1.09 0.61 - 1.24 mg/dL   Calcium 7.3 (L) 8.9 - 10.3 mg/dL   Total Protein 5.6 (L) 6.5 - 8.1 g/dL   Albumin 1.6 (L) 3.5 - 5.0 g/dL   AST 36 15 - 41 U/L   ALT 18 0 - 44 U/L   Alkaline Phosphatase 752 (H) 38 - 126 U/L   Total Bilirubin 1.9 (H) 0.3 - 1.2 mg/dL   GFR calc non Af Amer >60 >60 mL/min   GFR calc Af Amer >60 >60 mL/min   Anion gap 12 5 - 15    Comment: Performed at [facility], [place] [address] [place], [place] [address]  Lactic acid, plasma     Status: Abnormal   Collection Time: 02/13/2020  1:27 PM  Result Value Ref Range   Lactic Acid, Venous 2.0 (HH) 0.5 - 1.9 mmol/L    Comment: CRITICAL RESULT  CALLED TO, READ BACK BY AND VERIFIED WITHSharlyn Bologna RN AT 5038 02/25/2020 MULLINS,T Performed at [facility], [place] [address], [place], [place] [address]   CBC with Differential     Status: Abnormal   Collection Time: 02/14/2020  1:27 PM  Result Value Ref Range   WBC 4.4 4.0 - 10.5 K/uL   RBC 2.20 (L) 4.22 - 5.81 MIL/uL   Hemoglobin 6.4 (LL) 13.0 - 17.0 g/dL    Comment: REPEATED TO VERIFY THIS CRITICAL RESULT HAS VERIFIED AND BEEN CALLED TO WEST,S. RN BY KATHLEEN COHEN ON 03 23 2021 AT 0349, AND HAS BEEN READ BACK. CRITICAL RESULT VERIFIED    HCT 20.6 (L) 39.0 - 52.0 %   MCV 93.6 80.0 - 100.0 fL   MCH 29.1 26.0 - 34.0 pg   MCHC 31.1 30.0 - 36.0 g/dL   RDW 19.6 (H) 11.5 - 15.5 %   Platelets 80 (L) 150 - 400 K/uL    Comment: REPEATED TO VERIFY PLATELET COUNT CONFIRMED BY SMEAR SPECIMEN CHECKED FOR CLOTS Immature Platelet Fraction may be clinically indicated, consider ordering this additional test [id number]    nRBC 11.9 (H) 0.0 - 0.2 %   Neutrophils Relative % 60 %   Neutro Abs 2.6 1.7 - 7.7 K/uL   Lymphocytes Relative 29 %   Lymphs Abs 1.3 0.7 - 4.0 K/uL   Monocytes Relative 4 %   Monocytes Absolute 0.2 0.1 - 1.0 K/uL   Eosinophils Relative 0 %   Eosinophils Absolute 0.0 0.0 - 0.5 K/uL   Basophils Relative 0 %   Basophils Absolute 0.0 0.0 - 0.1 K/uL   Immature Granulocytes 7 %   Abs Immature Granulocytes 0.31 (H) 0.00 - 0.07 K/uL   Polychromasia PRESENT    Target Cells PRESENT     Comment: Performed at [facility], [place] [address] [place], [place] [address]  Protime-INR     Status: Abnormal   Collection Time: 02/26/2020  1:27 PM  Result Value Ref Range   Prothrombin Time 21.6 (H) 11.4 - 15.2 seconds   INR 1.9 (H) 0.8 - 1.2    Comment: (NOTE) INR goal varies based on device and disease states. Performed at [facility], [place] [address], [place],  [address]   Urinalysis, Routine w reflex microscopic  Status: Abnormal   Collection Time: 03/03/2020  1:27 PM  Result Value Ref Range   Color, Urine AMBER (A) YELLOW    Comment: BIOCHEMICALS MAY BE AFFECTED BY COLOR   APPearance CLOUDY (A) CLEAR   Specific Gravity, Urine 1.015 1.005 - 1.030   pH 5.0 5.0 - 8.0   Glucose, UA NEGATIVE NEGATIVE mg/dL   Hgb urine dipstick SMALL (A) NEGATIVE   Bilirubin Urine NEGATIVE NEGATIVE   Ketones, ur NEGATIVE NEGATIVE mg/dL   Protein, ur 30 (A) NEGATIVE mg/dL   Nitrite NEGATIVE NEGATIVE   Leukocytes,Ua MODERATE (A) NEGATIVE   RBC / HPF 0-5 0 - 5 RBC/hpf   WBC, UA 21-50 0 - 5 WBC/hpf   Bacteria, UA MANY (A) NONE SEEN   Squamous Epithelial / LPF 0-5 0 - 5   Budding Yeast PRESENT     Comment: Performed at [facility], [place] [address], [place], [place] [address]  POC SARS Coronavirus 2 Ag-ED - Nasal Swab (BD Veritor Kit)     Status: None   Collection Time: 02/28/2020  2:18 PM  Result Value Ref Range   SARS Coronavirus 2 Ag NEGATIVE NEGATIVE    Comment: (NOTE) SARS-CoV-2 antigen NOT DETECTED.  Negative results are presumptive.  Negative results do not preclude SARS-CoV-2 infection and should not be used as the sole basis for treatment or other patient management decisions, including infection  control decisions, particularly in the presence of clinical signs and  symptoms consistent with COVID-19, or in those who have been in contact with the virus.  Negative results must be combined with clinical observations, patient history, and epidemiological information. The expected result is Negative. Fact Sheet for Patients: PodPark.tn Fact Sheet for Healthcare Providers: GiftContent.is This test is not yet approved or cleared by the [place] FDA and  has been authorized for detection and/or diagnosis of SARS-CoV-2 by FDA under an Emergency Use Authorization (EUA).  This EUA will remain in effect (meaning this test can be used)  for the duration of  the COVID-19 de claration under Section 564(b)(1) of the Act, 21 U.S.C. section 360bbb-3(b)(1), unless the authorization is terminated or revoked sooner.   Lactic acid, plasma     Status: None   Collection Time: 02/21/2020  3:27 PM  Result Value Ref Range   Lactic Acid, Venous 1.9 0.5 - 1.9 mmol/L    Comment: Performed at [facility], [place] [address], [place], [place] [address]  SARS CORONAVIRUS 2 (TAT 6-24 HRS) Nasopharyngeal Nasopharyngeal Swab     Status: None   Collection Time: 03/07/2020  3:48 PM   Specimen: Nasopharyngeal Swab  Result Value Ref Range   SARS Coronavirus 2 NEGATIVE NEGATIVE    Comment: (NOTE) SARS-CoV-2 target nucleic acids are NOT DETECTED. The SARS-CoV-2 RNA is generally detectable in upper and lower respiratory specimens during the acute phase of infection. Negative results do not preclude SARS-CoV-2 infection, do not rule out co-infections with other pathogens, and should not be used as the sole basis for treatment or other patient management decisions. Negative results must be combined with clinical observations, patient history, and epidemiological information. The expected result is Negative. Fact Sheet for Patients: SugarRoll.be Fact Sheet for Healthcare Providers: https://www.woods-mathews.com/ This test is not yet approved or cleared by the [place] FDA and  has been authorized for detection and/or diagnosis of SARS-CoV-2 by FDA under an Emergency Use Authorization (EUA). This EUA will remain  in effect (meaning this test can be used) for the duration  of the COVID-19 declaration under Section 56 4(b)(1) of the Act, 21 U.S.C. section 360bbb-3(b)(1), unless the authorization is terminated or revoked sooner. Performed at [facility], [place] [address] [place], [place] [address]   Type and screen [place]     Status: None   Collection Time:  02/13/2020  4:00 PM  Result Value Ref Range   ABO/RH(D) B NEG    Antibody Screen NEG    Sample Expiration 03/04/2020,2359    Unit Number [id number]    Blood Component Type RED CELLS,LR    Unit division 00    Status of Unit ISSUED,FINAL    Transfusion Status OK TO TRANSFUSE    Crossmatch Result Compatible    Unit Number [id number]    Blood Component Type RED CELLS,LR    Unit division 00    Status of Unit ISSUED,FINAL    Transfusion Status OK TO TRANSFUSE    Crossmatch Result      Compatible Performed at [facility], [place] [address], [place], [place] [address]   Prepare RBC (crossmatch)     Status: None   Collection Time: 02/29/2020  4:00 PM  Result Value Ref Range   Order Confirmation      ORDER PROCESSED BY BLOOD BANK Performed at [place] [address], [place], [place] [address]   Magnesium     Status: None   Collection Time: 03/02/20  5:00 AM  Result Value Ref Range   Magnesium 1.8 1.7 - 2.4 mg/dL    Comment: Performed at [facility], [place] [address], [place], [place] [address]  Phosphorus     Status: None   Collection Time: 03/02/20  5:00 AM  Result Value Ref Range   Phosphorus 2.5 2.5 - 4.6 mg/dL    Comment: Performed at [facility], [place] [address], [place], [place] [address]  TSH     Status: None   Collection Time: 03/02/20  5:00 AM  Result Value Ref Range   TSH 0.720 0.350 - 4.500 uIU/mL    Comment: Performed by a 3rd Generation assay with a functional sensitivity of <=0.01 uIU/mL. Performed at [facility], [place] [address], [place], [place] [address]   Basic metabolic panel     Status: Abnormal   Collection Time: 03/02/20  5:00 AM  Result Value Ref Range   Sodium 151 (H) 135 - 145 mmol/L   Potassium 3.0 (L) 3.5 - 5.1 mmol/L   Chloride 122 (H) 98 - 111 mmol/L   CO2 19 (L) 22 - 32 mmol/L   Glucose, Bld 94 70 - 99 mg/dL    Comment: Glucose reference range  applies only to samples taken after fasting for at least 8 hours.   BUN 19 8 - 23 mg/dL   Creatinine, Ser 0.81 0.61 - 1.24 mg/dL   Calcium 7.5 (L) 8.9 - 10.3 mg/dL   GFR calc non Af Amer >60 >60 mL/min   GFR calc Af Amer >60 >60 mL/min   Anion gap 10 5 - 15    Comment: Performed at [facility], [place] [address], [place], [place] [address]  CBC     Status: Abnormal   Collection Time: 03/02/20  5:00 AM  Result Value Ref Range   WBC 5.0 4.0 - 10.5 K/uL   RBC 3.14 (L) 4.22 - 5.81 MIL/uL   Hemoglobin 9.0 (L) 13.0 - 17.0 g/dL    Comment: REPEATED TO VERIFY POST TRANSFUSION  SPECIMEN DELTA CHECK NOTED    HCT 28.5 (L) 39.0 - 52.0 %   MCV 90.8 80.0 - 100.0 fL   MCH 28.7 26.0 - 34.0 pg   MCHC 31.6 30.0 - 36.0 g/dL   RDW 19.2 (H) 11.5 - 15.5 %   Platelets 75 (L) 150 - 400 K/uL    Comment: REPEATED TO VERIFY Immature Platelet Fraction may be clinically indicated, consider ordering this additional test [id number] CONSISTENT WITH PREVIOUS RESULT    nRBC 6.7 (H) 0.0 - 0.2 %    Comment: Performed at [facility], [place] [address], [place], [place] [address]   CT Head Wo Contrast  Result Date: 02/21/2020 CLINICAL DATA:  Fever with altered mental status. History of bladder carcinoma. EXAM: CT HEAD WITHOUT CONTRAST TECHNIQUE: Contiguous axial images were obtained from the base of the skull through the vertex without intravenous contrast. COMPARISON:  None. FINDINGS: Brain: There is moderate diffuse atrophy. There is no intracranial mass, hemorrhage, extra-axial fluid collection, or midline shift. 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 There is patchy small vessel disease in the centra semiovale bilaterally. No evident acute infarct. Vascular: No hyperdense vessel. There is calcification in each carotid siphon region. Skull: The bony calvarium appears intact. Sinuses/Orbits: There is opacification of anterior  left ethmoid air cell. There is mucosal thickening in the posterior right sphenoid sinus region. Other visualized paranasal sinuses are clear. Visualized orbits appear symmetric bilaterally. Other: There is diffuse opacification of mastoid air cells on the left. Mastoids on the right are clear. IMPRESSION: 1. Atrophy with periventricular small vessel disease. 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 No acute infarct. No mass or hemorrhage. 2.  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 3.  Mild intracranial intracranial arterial vascular calcification. 4.  Areas of paranasal sinus disease. Electronically Signed   By: Lowella Grip III M.D.   On: 03/08/2020 18:00   DG Chest Port 1 View  Result Date: 03/03/2020 CLINICAL DATA:  Altered level of consciousness, fever EXAM: PORTABLE CHEST 1 VIEW COMPARISON:  01/28/2020 FINDINGS: Single frontal view of the chest demonstrates an unremarkable cardiac silhouette. Interstitial prominence unchanged. No airspace disease, effusion, or pneumothorax. Diffuse bony sclerosis consistent with known metastatic prostate cancer. IMPRESSION: 1. Chronic interstitial edema.  No acute airspace disease. 2. Continued widespread bony metastases. Electronically Signed   By: Randa Ngo M.D.   On: 02/15/2020 13:55    Pending Labs Unresulted Labs (From admission, onward)    Start     Ordered   03/02/20 0500  Creatinine, serum  Every 48 hours,   R     02/19/2020 2033   03/02/20 0449  Urine culture  Once,   STAT     03/02/20 0448   03/02/20 0449  Culture, sputum-assessment  Once,   R     03/02/20 0448   03/02/2020 1327  Culture, blood (Routine x 2)  BLOOD CULTURE X 2,   STAT     03/04/2020 1327          Vitals/Pain Today's Vitals   03/02/20 1630 03/02/20 1700 03/02/20 1735 03/02/20 1800  BP: (!) 129/92 132/78  124/85  Pulse: 96 (!) 103 92 94  Resp: _0 Temp:      TempSrc:      SpO2: 98% 98% 96% 97%  PainSc:  Isolation Precautions No active isolations  Medications Medications  vancomycin (VANCOCIN) IVPB 1000 mg/200 mL premix (0 mg Intravenous Stopped 03/02/20 1158)  hydrocortisone sodium succinate (SOLU-CORTEF) 100 MG injection 50 mg (50 mg Intravenous Given 03/02/20 1157)  dextrose 5 %-0.45 % sodium chloride infusion ( Intravenous New Bag/Given 03/02/20 0953)  ceFEPIme (MAXIPIME) 2 g in sodium chloride 0.9 % 100 mL IVPB (has no administration in time range)  sodium chloride 0.9 % bolus 1,000 mL (0 mLs Intravenous Stopped 02/25/2020 1551)    And  sodium chloride 0.9 % bolus 1,000 mL (0 mLs Intravenous Stopped 02/10/2020 1501)  ceFEPIme (MAXIPIME) 2 g in sodium chloride 0.9 % 100 mL IVPB (0 g Intravenous Stopped 03/05/2020 1431)  0.9 %  sodium chloride infusion (Manually program via Guardrails IV Fluids) ( Intravenous Stopped 03/02/20 0047)  potassium chloride 10 mEq in 100 mL IVPB (0 mEq Intravenous Stopped 03/02/20 0047)    Mobility non-ambulatory

## 2020-03-02 NOTE — ED Notes (Signed)
Oncologist at bedside, states she will get in contact with family to give update.

## 2020-03-03 LAB — MRSA PCR SCREENING: MRSA by PCR: POSITIVE — AB

## 2020-03-03 MED ORDER — CHLORHEXIDINE GLUCONATE CLOTH 2 % EX PADS: 6.0000 | MEDICATED_PAD | Freq: Every day | CUTANEOUS | Status: DC

## 2020-03-03 MED ORDER — RESOURCE THICKENUP CLEAR PO POWD
ORAL | Status: DC | PRN
  Filled 2020-03-03: qty 125

## 2020-03-03 MED ORDER — CHLORHEXIDINE GLUCONATE CLOTH 2 % EX PADS
6.0000 | MEDICATED_PAD | Freq: Every day | CUTANEOUS | Status: DC
  Administered 2020-03-03 – 2020-03-06 (×4): 6 via TOPICAL

## 2020-03-03 MED ORDER — MUPIROCIN 2 % EX OINT
1.0000 "application " | TOPICAL_OINTMENT | Freq: Two times a day (BID) | CUTANEOUS | Status: DC
  Administered 2020-03-03 – 2020-03-06 (×7): 1 via NASAL
  Filled 2020-03-03: qty 22

## 2020-03-03 NOTE — TOC Initial Note (Addendum)
Transition of Care [facility]) - Initial/Assessment Note    Patient Details  Name: Frank Daugherty MRN: [id number] Date of Birth: [birth year]  Transition of Care [facility]) CM/SW Contact:    Lia Hopping, LCSW Phone Number: 03/03/2020, 4:16 PM  Clinical Narrative:   CSW discussed plan of care with oncology physician Dr. Burr Medico.  CSW met with the patient son at bedside.            Patient overall is deteriorating. Patient is requiring residential hospice treatment at this time. Patient ex-wife and son prefer the patient go to a [facility] in [place] reached out to the [facility] per the family request. The medical director states the patient does not meet criteria for their inpatient hospice care at this care. CSW called additional hospice agencies, again, their concern is the patient prognosis. CSW explain to the son and ex-wife if the patient goes to SNF with hospice services the family is responsible for room and board at SNF. Both report they cannot afford to the price. CSW sent a referral to the [place] for 6-8 weeks for hospice care. The patient does not yet qualify for the 1st general inpatient care which the insurance covers at 100 percent. The patient will discharge under "rountine care" status with $140 private care per day. If the family can provide the patient proof of income they will prorate the the private price.   Patient and spouse are agreeable to move the [facility] to [facility] with the understanding they can move the patient home with hospice care at anytime.   CSW asked about the visitor policy and provided updates to the family.    TOC staff will continue to assist with discharge.   Expected Discharge Plan: [place] Barriers to Discharge: No Barriers Identified   Patient Goals and CMS Choice   CMS Medicare.gov Compare Post Acute Care list provided to:: Legal Guardian Choice offered  to / list presented to : Patient  Expected Discharge Plan and Services Expected Discharge Plan: [place] In-house Referral: Clinical Social Work Discharge Planning Services: CM Consult Post Acute Care Choice: Hospice Living arrangements for the past 2 months: [place], [place]                                      Prior Living Arrangements/Services Living arrangements for the past 2 months: [place], [place] Lives with:: Facility Resident Patient language and need for interpreter reviewed:: No Do you feel safe going back to the place where you live?: Yes      Need for Family Participation in Patient Care: Yes (Comment) Care giver support system in place?: Yes (comment)   Criminal Activity/Legal Involvement Pertinent to Current Situation/Hospitalization: No - Comment as needed  Activities of Daily Living Home Assistive Devices/Equipment: Other (Comment)(pt has been [facility] eqipment at [facility]. Pt is unable to answer questions.) ADL Screening (condition at time of admission) Patient's cognitive ability adequate to safely complete daily activities?: No Is the patient deaf or have difficulty hearing?: No Does the patient have difficulty seeing, even when wearing glasses/contacts?: No Does the patient have difficulty concentrating, remembering, or making decisions?: Yes Patient able to express need for assistance with ADLs?: No Does the patient have difficulty dressing or bathing?: Yes Independently performs ADLs?: No Communication: Needs assistance Is this  a change from baseline?: Pre-admission baseline Dressing (OT): Needs assistance Is this a change from baseline?: Pre-admission baseline Grooming: Needs assistance Is this a change from baseline?: Pre-admission baseline Feeding: Needs assistance Is this a change from baseline?: Pre-admission baseline Bathing: Needs assistance Is this a change from  baseline?: Pre-admission baseline Toileting: Needs assistance Is this a change from baseline?: Pre-admission baseline In/Out Bed: Needs assistance Is this a change from baseline?: Pre-admission baseline Walks in Home: Needs assistance Is this a change from baseline?: Pre-admission baseline Does the patient have difficulty walking or climbing stairs?: Yes Weakness of Legs: Both Weakness of Arms/Hands: Both  Permission Sought/Granted Permission sought to share information with : Case Manager Permission granted to share information with : Yes, Verbal Permission Granted  Share Information with NAME: Yahya, Boldman [phone]  Permission granted to share info w AGENCY: Hospice Agency  Permission granted to share info w Relationship: Son     Emotional Assessment Appearance:: Appears stated age Attitude/Demeanor/Rapport: Unable to Assess Affect (typically observed): Unable to Assess Orientation: : (Disoriented x4) Alcohol / Substance Use: Not Applicable Psych Involvement: No (comment)  Admission diagnosis:  Dehydration [E86.0] Sepsis ([place]) [A41.9] Sepsis with acute organ dysfunction without septic shock, due to unspecified organism, unspecified type ([place]) [A41.9, R65.20] Patient Active Problem List   Diagnosis Date Noted  . Sepsis ([place]) 03/03/2020  . Palliative care by specialist   . Goals of care, counseling/discussion   . General weakness   . Prostate cancer metastatic to bone ([place])   . E coli bacteremia   . Severe sepsis with septic shock ([place]) 01/28/2020  . Acute lower UTI 01/28/2020  . Elevated lactic acid level 01/28/2020  . Acute on chronic anemia 01/28/2020  . Acute metabolic encephalopathy 57/89/7847  . AKI (acute kidney injury) ([place]) 01/28/2020  . Hypocalcemia 01/28/2020  . Transaminitis 01/28/2020  . 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 Abnormal liver function tests   . Abnormal serum level of alkaline phosphatase   . Abnormal MRI of abdomen    . Atrial fibrillation with RVR ([place]) 01/05/2020  . Renal insufficiency 01/05/2020  . Coagulopathy ([place]) 01/05/2020  . Hyperglycemia 01/05/2020  . HTN (hypertension) 04/23/2012  . HLD (hyperlipidemia) 04/23/2012   PCP:  Patient, No Pcp Per Pharmacy:   [place], [place] [address] Phone: [phone] Fax: [phone]  [id number] [place], [address] AT [place] [facility] [place] [address] Phone: [phone] Fax: [phone]     Social Determinants of Health (SDOH) Interventions    Readmission Risk Interventions No flowsheet data found.

## 2020-03-03 NOTE — Progress Notes (Signed)
Frank Daugherty   DOB:11-Oct-1946   [id number]   [id number]  Oncology follow up note   Subjective: Pt is waiting for swallow evaluation by speech therapist.  He was laying in bed, not in distress, appears lethargic, his response to question was slower than yesterday. Son at bedside.   Objective:  Vitals:   03/03/20 0500 03/03/20 1414  BP: (!) 128/96 131/87  Pulse: 96 94  Resp: 17 17  Temp: 97.6 F (36.4 C) 97.9 F (36.6 C)  SpO2: 97% 99%    Body mass index is 19.37 kg/m.  Intake/Output Summary (Last 24 hours) at 03/03/2020 1908 Last data filed at 03/03/2020 1800 Gross per 24 hour  Intake 706.54 ml  Output 970 ml  Net -263.46 ml     Sclerae unicteric  Oropharynx clear  No peripheral adenopathy  Lungs clear -- no rales or rhonchi  Heart regular rate and rhythm  Abdomen benign  MSK no focal spinal tenderness, no peripheral edema  Neuro nonfocal, drowsy    CBG (last 3)  No results for input(s): GLUCAP in the last 72 hours.   Labs:  Urine Studies No results for input(s): UHGB, CRYS in the last 72 hours.  Invalid input(s): UACOL, UAPR, USPG, UPH, UTP, UGL, UKET, UBIL, UNIT, UROB, ULEU, UEPI, UWBC, URBC, UBAC, CAST, UCOM, BILUA  Basic Metabolic Panel: Recent Labs  Lab 02/24/2020 1327 03/02/20 0500  NA 153* 151*  K 3.1* 3.0*  CL 118* 122*  CO2 23 19*  GLUCOSE 107* 94  BUN 19 19  CREATININE 1.09 0.81  CALCIUM 7.3* 7.5*  MG  --  1.8  PHOS  --  2.5   GFR Estimated Creatinine Clearance: 71.3 mL/min (by C-G formula based on SCr of 0.81 mg/dL). Liver Function Tests: Recent Labs  Lab 03/08/2020 1327  AST 36  ALT 18  ALKPHOS 752*  BILITOT 1.9*  PROT 5.6*  ALBUMIN 1.6*   No results for input(s): LIPASE, AMYLASE in the last 168 hours. No results for input(s): AMMONIA in the last 168 hours. Coagulation profile Recent Labs  Lab 03/05/2020 1327  INR 1.9*    CBC: Recent Labs  Lab 02/28/2020 1327 03/02/20 0500  WBC 4.4 5.0  NEUTROABS 2.6  --   HGB 6.4*  9.0*  HCT 20.6* 28.5*  MCV 93.6 90.8  PLT 80* 75*   Cardiac Enzymes: No results for input(s): CKTOTAL, CKMB, CKMBINDEX, TROPONINI in the last 168 hours. BNP: Invalid input(s): POCBNP CBG: No results for input(s): GLUCAP in the last 168 hours. D-Dimer No results for input(s): DDIMER in the last 72 hours. Hgb A1c No results for input(s): HGBA1C in the last 72 hours. Lipid Profile No results for input(s): CHOL, HDL, LDLCALC, TRIG, CHOLHDL, LDLDIRECT in the last 72 hours. Thyroid function studies Recent Labs    03/02/20 0500  TSH 0.720   Anemia work up No results for input(s): VITAMINB12, FOLATE, FERRITIN, TIBC, IRON, RETICCTPCT in the last 72 hours. Microbiology Recent Results (from the past 240 hour(s))  Urine culture     Status: Abnormal (Preliminary result)   Collection Time: 02/17/2020  1:27 PM   Specimen: Urine, Catheterized  Result Value Ref Range Status   Specimen Description   Final    URINE, CATHETERIZED Performed at [facility] [address] [place], [place] [address]    Special Requests   Final    NONE Performed at [facility], [place] [address], [place], [place] [address]    Culture (A)  Final    >=  100,000 COLONIES/mL ESCHERICHIA COLI SUSCEPTIBILITIES TO FOLLOW Performed at [facility] [address], [place], [place] [address]    Report Status PENDING  Incomplete  SARS CORONAVIRUS 2 (TAT 6-24 HRS) Nasopharyngeal Nasopharyngeal Swab     Status: None   Collection Time: 02/09/2020  3:48 PM   Specimen: Nasopharyngeal Swab  Result Value Ref Range Status   SARS Coronavirus 2 NEGATIVE NEGATIVE Final    Comment: (NOTE) SARS-CoV-2 target nucleic acids are NOT DETECTED. The SARS-CoV-2 RNA is generally detectable in upper and lower respiratory specimens during the acute phase of infection. Negative results do not preclude SARS-CoV-2 infection, do not rule out co-infections with other pathogens, and should not be used as  the sole basis for treatment or other patient management decisions. Negative results must be combined with clinical observations, patient history, and epidemiological information. The expected result is Negative. Fact Sheet for Patients: SugarRoll.be Fact Sheet for Healthcare Providers: https://www.woods-mathews.com/ This test is not yet approved or cleared by the [place] FDA and  has been authorized for detection and/or diagnosis of SARS-CoV-2 by FDA under an Emergency Use Authorization (EUA). This EUA will remain  in effect (meaning this test can be used) for the duration of the COVID-19 declaration under Section 56 4(b)(1) of the Act, 21 U.S.C. section 360bbb-3(b)(1), unless the authorization is terminated or revoked sooner. Performed at [facility], [place] [address], [place], [place] [address]   MRSA PCR Screening     Status: Abnormal   Collection Time: 03/03/20  2:49 AM   Specimen: Nasopharyngeal  Result Value Ref Range Status   MRSA by PCR POSITIVE (A) NEGATIVE Final    Comment:        The GeneXpert MRSA Assay (FDA approved for NASAL specimens only), is one component of a comprehensive MRSA colonization surveillance program. It is not intended to diagnose MRSA infection nor to guide or monitor treatment for MRSA infections. RESULT CALLED TO, READ BACK BY AND VERIFIED WITH: THERMAN,T. @0943  ON 3.25.2021 BY [facility] Performed at [facility], [place] [address], [place],  [address]       Studies:  No results found.  Assessment: 74 y.o. with recently diagnosed metastatic breast cancer to bone, atrial fibrillation, hypertension, was admitted for sepsis and failure to thrive.  1.  Sepsis likely secondary to complicated UTI, patient has chronic indwelling Foley 2.  Metabolic encephalopathy 3.  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  Worsening anemia and moderate thrombocytopenia, secondary to #3,  s/p blood transfusion  5.  Hypernatremia and hypokalemia 6.  Severe protein and calorie malnutrition 7. Failure to thrive  8. Code status: DNR   Plan:  -urine culture (+) E Coli, will defer antibiotics to primary team -his encephalopathy has not improved to me, hope he will pass swallow test  -I discussed discharge plan with SW Champ Mungo today.  His expected life expectancy is probably a few weeks to a month, he may not be qualify for most residential hospice.  Due to his extremely poor performance status, he will unlikely participate rehabilitation, which will make SNF placement also difficult. His son and ex-wife may not be able to take care of him at home. Elmyra Ricks will discuss other options with his son today  -I will f/u as needed.    Truitt Merle, MD 03/03/2020

## 2020-03-03 NOTE — Evaluation (Signed)
Clinical/Bedside Swallow Evaluation Patient Details  Name: Yeremy Trebing MRN: [id number] Date of Birth: [birth year]  Today's Date: 03/03/2020 Time: SLP Start Time (ACUTE ONLY): 1419 SLP Stop Time (ACUTE ONLY): 1520 SLP Time Calculation (min) (ACUTE ONLY): 61 min  Past Medical History:  Past Medical History:  Diagnosis Date  . Chronic anemia   . Hypertension    Past Surgical History: History reviewed. No pertinent surgical history. HPI:  74 yo male adm to [facility] with AMS - UTI - chronic indwelling catheter - with resultant sepsis and encephalopathy causing lethargy. Pt has prostate cancer with widespread metastasis per chart review.  CT head negative.  CXR negative for acute change but shows mets.  Oncologist recommended comfort care/hospice.  Swallow eval ordered received.   Assessment / Plan / Recommendation Clinical Impression  Pt noted to have dried secretions adhered to tongue and most white secretions attached to soft palate. SLP set up oral suction and provided mouth care to the pt.  CN exam unremarkable but pt does tend to lean to the right.  Pt has a h/o of a right internal capsule CVA which may have caused his dysphagia and admits to some premorbid coughing with liquids more than foods.  His reflexive cough and voice are weak - suspect due to deconditioning and acute illness.    Pt self feeding icecream and applesauce was tolerated without indication of aspiration but mutiple swallows that may indicate pharyngeal or oral residuals.  After pt swallows thin and nectar consistencies, he conducts throat clearing and multiple swallows with belching that may indicated cervical esophageal deficits.   Pt is able to follow directions to clear his throat and swallow prn.  Educated pt and son to findings/aspiration precautions with written instructions and teach back.    Son and pt advise they are not aware of his definitive medical care plan as they state "we are trying to figure this out".  Given  pt with marked improved mentation but continuing s/s of dysphagia - son and pt desire an MBS when presented with options of full diet or modified diet with precautions and MBS.   MBS will be completed next date to allow determination of safest diet consistency and helpful mitigation strategies.  In the interm, modified diet has been placed and RN, pt/family informed.   SLP Visit Diagnosis: Dysphagia, unspecified (R13.10)    Aspiration Risk  Moderate aspiration risk    Diet Recommendation Dysphagia 3 (Mech soft);Nectar-thick liquid(thin liquds any time except when eating or taking medications)   Liquid Administration via: Cup;Straw Medication Administration: Whole meds with puree Supervision: Patient able to self feed Compensations: Slow rate;Small sips/bites Postural Changes: Seated upright at 90 degrees;Remain upright for at least 30 minutes after po intake    Other  Recommendations Oral Care Recommendations: Oral care BID Other Recommendations: Order thickener from pharmacy   Follow up Recommendations (tbd)      Frequency and Duration     tbd       Prognosis    n/a    Swallow Study   General Date of Onset: 03/03/20 HPI: 74 yo male adm to [facility] with AMS - UTI - chronic indwelling catheter - with resultant sepsis and encephalopathy causing lethargy. Pt has prostate cancer with widespread metastasis per chart review.  CT head negative.  CXR negative for acute change but shows mets.  Oncologist recommended comfort care/hospice.  Swallow eval ordered received. Type of Study: Bedside Swallow Evaluation Diet Prior to this Study: NPO Temperature Spikes Noted: No  Respiratory Status: Room air History of Recent Intubation: No Behavior/Cognition: Alert;Cooperative;Pleasant mood Oral Cavity Assessment: Within Functional Limits Oral Care Completed by SLP: No Oral Cavity - Dentition: Other (Comment)(few dentition present) Vision: Functional for self-feeding Self-Feeding Abilities: Needs  set up Patient Positioning: Upright in bed Baseline Vocal Quality: Low vocal intensity Volitional Cough: Weak Volitional Swallow: Unable to elicit    Oral/Motor/Sensory Function Overall Oral Motor/Sensory Function: Generalized oral weakness   Ice Chips Ice chips: Within functional limits Presentation: Spoon   Thin Liquid Thin Liquid: Impaired Presentation: Self Fed;Cup;Straw;Spoon Pharyngeal  Phase Impairments: Multiple swallows;Throat Clearing - Immediate;Throat Clearing - Delayed;Cough - Delayed    Nectar Thick Nectar Thick Liquid: Impaired Presentation: Cup;Straw Pharyngeal Phase Impairments: Multiple swallows;Throat Clearing - Delayed;Throat Clearing - Immediate   Honey Thick Honey Thick Liquid: Not tested   Puree Puree: Within functional limits Presentation: Self Fed;Spoon   Solid     Solid: Impaired Presentation: Self Fed Oral Phase Functional Implications: Prolonged oral transit;Impaired mastication;Other (comment) Other Comments: prolonged mastication - pt used nectar liquids to moisten solid bolus, he was able to clear oral cavity of single cracker after total time of approx 4 minutes (self feeding)      Macario Golds 03/03/2020,4:34 PM  Kathleen Lime, MS [place] Office [phone]

## 2020-03-03 NOTE — Progress Notes (Signed)
PROGRESS NOTE  Frank Daugherty  DOB: December 12, 1945  PCP: Frank Daugherty, No Pcp Per WLN:[id number]  DOA: 02/21/2020 Admitted From: SNF  LOS: 2 days   Chief Complaint  Frank Daugherty presents with  . Posssible Sepsis  . Failure To Thrive   Brief narrative: Frank Daugherty is a 74 year old African-American male with PMH significant for prostate cancer for which Frank Daugherty has chronic indwelling catheter; also with history of hypertension and chronic anemia.  Frank Daugherty is a skilled nursing facility resident. Per history, Frank Daugherty has been failing to thrive in the last 3 weeks.  On 3/23, Frank Daugherty was noted to have fever and altered mental status and sent to the ED.  In the ED, Frank Daugherty had fever up to 101.1, blood pressure down at 80s, tachycardic to 120s. Work-up showed normal WBC count, elevated lactic acid level to 2, sodium level 153, potassium 3.1, albumin low at 1.6, hemoglobin low at 6.4, platelet count down to 80s, creatinine at 1.09. Alkaline phosphatase elevated to 752. CT head is negative for acute infarct, mass or hemorrhage.    It also showed extensive mastoid air cell disease on the left with mild intracranial arterial vascular calcification and areas of paranasal sinus disease.   Chest x-ray is said to reveal chronic interstitial edema, without acute airspace disease. 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  Frank Daugherty was admitted to hospitalist service for further evaluation and management.  Subjective: Frank Daugherty was seen and examined this morning.  Elderly African-American male. Looks more awake than yesterday.  Able to answer simple yes and no questions with muffled voice.  Knows he is in the hospital.    Assessment/Plan: Sepsis likely secondary to complicated UTI Chronic indwelling Foley catheter -Met sepsis criteria on admission. -Culture report awaited. -Currently on broad-spectrum antibiotic coverage with IV vancomycin and IV cefepime. -Continue maintenance IV hydration. -Lactic acid level  improved to normal  Hypernatremia -Sodium level was elevated to 153 on admission.  With dextrose drip, level improved to 151. -Last echo revealed, normal EF. -Monitor sodium level.  Monitor urine output.  Hypokalemia: -Potassium level low at 3.0 today.  Replaced.  Recheck tomorrow.   Acute on chronic anemia -No evidence of active bleeding but hemoglobin was low at 6.4.  1 unit of PRBC was transfused.  Hemoglobin improved to 9.  -Continue to monitor hemoglobin.  Encephalopathy, likely combined toxic and metabolic -Mental status gradually improving.    Advanced directive -Frank Daugherty is a DNR. -Frank Daugherty oncologist Dr. Burr Medico saw him yesterday.  She discussed with the family.  I also discussed the family this morning.  Family understands the poor health condition and poor prognosis of this Frank Daugherty. -Family is agreeable to pursue hospice care.  They would like him placed at the residential hospice at [facility].  If not possible, Frank Daugherty will be discharged to SNF in that area with a plan to look around for hospice services. -Frank Daugherty submitted clear to me this morning that he wants the antibiotics and fluid to continue while Frank Daugherty is in the hospital.  DVT prophylaxis:  SCDs Antimicrobials:  IV cefepime and IV vancomycin Fluid: D5 at 75 mL/h Diet: N.p.o. at this time.  Ongoing speech evaluation.  We should be able to start him on some feeding this afternoon. Mobility: Needs PT eval if placement desired.  I will order for 1 today. Family Communication:  None at bedside Discharge plan:  Anticipated date and disposition: More than 2 midnights Barriers: Ongoing work-up for sepsis  Consultants:  None  Antimicrobials: Anti-infectives (From admission, onward)   Start  Dose/Rate Route Frequency Ordered Stop   03/02/20 2200  ceFEPIme (MAXIPIME) 2 g in sodium chloride 0.9 % 100 mL IVPB     2 g 200 mL/hr over 30 Minutes Intravenous Every 8 hours 03/02/20 1226     02/20/2020 2200  ceFEPIme  (MAXIPIME) 2 g in sodium chloride 0.9 % 100 mL IVPB  Status:  Discontinued     2 g 200 mL/hr over 30 Minutes Intravenous 2 times daily 02/20/2020 2033 03/02/20 1226   02/16/2020 2045  vancomycin (VANCOCIN) IVPB 1000 mg/200 mL premix     1,000 mg 200 mL/hr over 60 Minutes Intravenous Daily 03/04/2020 2033     03/04/2020 1345  ceFEPIme (MAXIPIME) 2 g in sodium chloride 0.9 % 100 mL IVPB     2 g 200 mL/hr over 30 Minutes Intravenous  Once 02/08/2020 1339 02/13/2020 1431        Code Status: DNR   Diet Order            Diet NPO time specified Except for: Sips with Meds  Diet effective now              Infusions:  . ceFEPime (MAXIPIME) IV 2 g (03/03/20 1426)  . vancomycin 1,000 mg (03/03/20 1045)    Scheduled Meds: . Chlorhexidine Gluconate Cloth  6 each Topical Q0600  . hydrocortisone sod succinate (SOLU-CORTEF) inj  50 mg Intravenous Q6H  . mupirocin ointment  1 application Nasal BID    PRN meds:    Objective: Vitals:   03/03/20 0500 03/03/20 1414  BP: (!) 128/96 131/87  Pulse: 96 94  Resp: 17 17  Temp: 97.6 F (36.4 C) 97.9 F (36.6 C)  SpO2: 97% 99%    Intake/Output Summary (Last 24 hours) at 03/03/2020 1453 Last data filed at 03/03/2020 1444 Gross per 24 hour  Intake 406.67 ml  Output 970 ml  Net -563.33 ml   There were no vitals filed for this visit. Weight change:  There is no height or weight on file to calculate BMI.   Physical Exam: General exam: Chronically sick looking.  Mental status improved this morning.  Not in physical distress Skin: No rashes, lesions or ulcers. HEENT: Atraumatic, normocephalic, supple neck, no obvious bleeding Lungs: Clear to auscultation bilaterally CVS: Tachycardic, no murmurs GI/Abd soft, nontender, nondistended, bowel sound present CNS: Alert, awake, able to answer simple yes/no questions. Psychiatry: Unable to examine due to altered mentation Extremities: No edema, no calf tenderness  Data Review: I have personally reviewed  the laboratory data and studies available.  Recent Labs  Lab 02/25/2020 1327 03/02/20 0500  WBC 4.4 5.0  NEUTROABS 2.6  --   HGB 6.4* 9.0*  HCT 20.6* 28.5*  MCV 93.6 90.8  PLT 80* 75*   Recent Labs  Lab 02/28/2020 1327 03/02/20 0500  NA 153* 151*  K 3.1* 3.0*  CL 118* 122*  CO2 23 19*  GLUCOSE 107* 94  BUN 19 19  CREATININE 1.09 0.81  CALCIUM 7.3* 7.5*  MG  --  1.8  PHOS  --  2.5   Signed, Terrilee Croak, MD [facility] Pager: [phone] (Secure Chat preferred). 03/03/2020

## 2020-03-03 NOTE — Progress Notes (Signed)
CRITICAL VALUE ALERT  Critical Value:  PCR MRSA +  Date & Time Notied:  03/03/20 @0950   Provider Notified: Dr. Pietro Cassis  Orders Received/Actions taken: Awaiting response from MD.  Physician responded @1018  with no new orders received.

## 2020-03-03 NOTE — Progress Notes (Addendum)
Initial Nutrition Assessment  DOCUMENTATION CODES:   Not applicable  INTERVENTION:  - will monitor for ability for diet to be advanced.  - weigh patient today.  NUTRITION DIAGNOSIS:   Increased nutrient needs related to chronic illness, cancer and cancer related treatments as evidenced by estimated needs.  GOAL:   Patient will meet greater than or equal to 90% of their needs  MONITOR:   Diet advancement, Labs, Weight trends  REASON FOR ASSESSMENT:   Malnutrition Screening Tool  ASSESSMENT:   74 year old male with medical history of prostate cancer with chronic indwelling catheter, HTN, and chronic anemia. He resides at [facility] and has been failing to thrive x3 weeks PTA. On 3/23 he was noted to have a fever and AMS so he was sent to the ED. CT head is negative for acute infarct, mass, or hemorrhage. CXR showed chronic interstitial edema without acute airspace disease. Xray also indicated widespread bony mets.  Patient has been NPO since admission. Able to talk with Dr. Pietro Cassis and SLP and plan for swallow evaluation later today. Patient noted to be disoriented x4 and was sleeping at the time of RD visit with no family/visitors present. Did not attempt to awake patient due to mental status and did not perform NFPE for this reason.   Patient has not been weighed since 02/25/20 (PTA). Weight on 02/25/20 was 137 lb, weight on 02/04/20 was 172 lb, and weight on 01/14/20 was 159 lb. This indicates 22 lb weight loss (13.8% body weight) in the past 1.5 months; significant for time frame. Patient at high risk for malnutrition and likely does currently meet criteria for malnutrition.   Per notes: - sepsis 2/2 complicated UTI - hypernatremia - hypokalemia - acute on chronic anemia - encephalopathy--very lethargic on 3/24 - possible Palliative Care consult   Labs reviewed; Na: 151 mmol/l, K: 3 mmol/l, Cl: 122 mmol/l, Ca: 7.5 mg/dl. Medications reviewed; 50 mg solu-cortef QID.    NUTRITION -  FOCUSED PHYSICAL EXAM:  unable to complete at this time.   Diet Order:   Diet Order            Diet NPO time specified Except for: Sips with Meds  Diet effective now              EDUCATION NEEDS:   No education needs have been identified at this time  Skin:  Skin Assessment: Reviewed RN Assessment  Last BM:  PTA/unknown  Height:   Ht Readings from Last 1 Encounters:  02/25/20 5\' 11"  (1.803 m)    Weight:   Wt Readings from Last 1 Encounters:  02/25/20 62.3 kg    Estimated Nutritional Needs:  Kcal:  1870-2180 kcal Protein:  95-110 grams Fluid:  >/= 2 L/day     Frank Matin, MS, RD, LDN, CNSC Inpatient Clinical Dietitian RD pager # available in [place]  After hours/weekend pager # available in [facility]

## 2020-03-03 NOTE — Progress Notes (Signed)
Unable to complete admission questions due to pt mental status. Will continue to monitor.

## 2020-03-04 ENCOUNTER — Inpatient Hospital Stay (HOSPITAL_COMMUNITY): Payer: Medicare Other

## 2020-03-04 LAB — CBC WITH DIFFERENTIAL/PLATELET
Abs Immature Granulocytes: 0.84 10*3/uL — ABNORMAL HIGH (ref 0.00–0.07)
Basophils Absolute: 0 10*3/uL (ref 0.0–0.1)
Basophils Relative: 0 %
Eosinophils Absolute: 0 10*3/uL (ref 0.0–0.5)
Eosinophils Relative: 0 %
HCT: 27.7 % — ABNORMAL LOW (ref 39.0–52.0)
Hemoglobin: 8.9 g/dL — ABNORMAL LOW (ref 13.0–17.0)
Immature Granulocytes: 10 %
Lymphocytes Relative: 23 %
Lymphs Abs: 1.9 10*3/uL (ref 0.7–4.0)
MCH: 29 pg (ref 26.0–34.0)
MCHC: 32.1 g/dL (ref 30.0–36.0)
MCV: 90.2 fL (ref 80.0–100.0)
Monocytes Absolute: 0.2 10*3/uL (ref 0.1–1.0)
Monocytes Relative: 3 %
Neutro Abs: 5.1 10*3/uL (ref 1.7–7.7)
Neutrophils Relative %: 64 %
Platelets: 49 10*3/uL — ABNORMAL LOW (ref 150–400)
RBC: 3.07 MIL/uL — ABNORMAL LOW (ref 4.22–5.81)
RDW: 19.1 % — ABNORMAL HIGH (ref 11.5–15.5)
WBC: 8.2 10*3/uL (ref 4.0–10.5)
nRBC: 2.6 % — ABNORMAL HIGH (ref 0.0–0.2)

## 2020-03-04 LAB — BASIC METABOLIC PANEL
Anion gap: 9 (ref 5–15)
Anion gap: 10 (ref 5–15)
BUN: 34 mg/dL — ABNORMAL HIGH (ref 8–23)
BUN: 36 mg/dL — ABNORMAL HIGH (ref 8–23)
CO2: 17 mmol/L — ABNORMAL LOW (ref 22–32)
CO2: 19 mmol/L — ABNORMAL LOW (ref 22–32)
Calcium: 7 mg/dL — ABNORMAL LOW (ref 8.9–10.3)
Calcium: 7.7 mg/dL — ABNORMAL LOW (ref 8.9–10.3)
Chloride: 119 mmol/L — ABNORMAL HIGH (ref 98–111)
Chloride: 122 mmol/L — ABNORMAL HIGH (ref 98–111)
Creatinine, Ser: 0.9 mg/dL (ref 0.61–1.24)
Creatinine, Ser: 0.93 mg/dL (ref 0.61–1.24)
GFR calc Af Amer: >60 mL/min (ref >60)
GFR calc Af Amer: >60 mL/min (ref >60)
GFR calc non Af Amer: >60 mL/min (ref >60)
GFR calc non Af Amer: >60 mL/min (ref >60)
Glucose, Bld: 577 mg/dL (ref 70–99)
Glucose, Bld: 205 mg/dL — ABNORMAL HIGH (ref 70–99)
Potassium: 3 mmol/L — ABNORMAL LOW (ref 3.5–5.1)
Potassium: 3.4 mmol/L — ABNORMAL LOW (ref 3.5–5.1)
Sodium: 145 mmol/L (ref 135–145)
Sodium: 151 mmol/L — ABNORMAL HIGH (ref 135–145)

## 2020-03-04 LAB — BETA-HYDROXYBUTYRIC ACID: Beta-Hydroxybutyric Acid: 0.08 mmol/L (ref 0.05–0.27)

## 2020-03-04 LAB — URINE CULTURE: Culture: >=100000 — AB

## 2020-03-04 LAB — HEMOGLOBIN A1C
Hgb A1c MFr Bld: 6.2 % — ABNORMAL HIGH (ref 4.8–5.6)
Mean Plasma Glucose: 131.24 mg/dL

## 2020-03-04 LAB — GLUCOSE, CAPILLARY
Glucose-Capillary: 207 mg/dL — ABNORMAL HIGH (ref 70–99)
Glucose-Capillary: 196 mg/dL — ABNORMAL HIGH (ref 70–99)
Glucose-Capillary: 175 mg/dL — ABNORMAL HIGH (ref 70–99)
Glucose-Capillary: 196 mg/dL — ABNORMAL HIGH (ref 70–99)
Glucose-Capillary: 183 mg/dL — ABNORMAL HIGH (ref 70–99)

## 2020-03-04 MED ORDER — POTASSIUM CHLORIDE 20 MEQ PO PACK
40.0000 meq | PACK | Freq: Once | ORAL | Status: AC
  Administered 2020-03-04: 40 meq via ORAL
  Filled 2020-03-04: qty 2

## 2020-03-04 MED ORDER — CEFAZOLIN SODIUM-DEXTROSE 2-4 GM/100ML-% IV SOLN
2.0000 g | Freq: Three times a day (TID) | INTRAVENOUS | Status: DC
  Administered 2020-03-04 – 2020-03-06 (×6): 2 g via INTRAVENOUS
  Filled 2020-03-04 (×6): qty 100

## 2020-03-04 MED ORDER — INSULIN ASPART 100 UNIT/ML ~~LOC~~ SOLN
0.0000 [IU] | Freq: Three times a day (TID) | SUBCUTANEOUS | Status: DC
  Administered 2020-03-04 – 2020-03-05 (×4): 2 [IU] via SUBCUTANEOUS
  Administered 2020-03-05: 3 [IU] via SUBCUTANEOUS
  Administered 2020-03-06: 2 [IU] via SUBCUTANEOUS

## 2020-03-04 MED ORDER — DEXTROSE-NACL 5-0.45 % IV SOLN: INTRAVENOUS | Status: AC

## 2020-03-04 MED ORDER — CEFAZOLIN SODIUM-DEXTROSE 1-4 GM/50ML-% IV SOLN: 1.0000 g | Freq: Three times a day (TID) | INTRAVENOUS | Status: DC

## 2020-03-04 MED ORDER — INSULIN ASPART 100 UNIT/ML ~~LOC~~ SOLN: 0.0000 [IU] | Freq: Every day | SUBCUTANEOUS | Status: DC

## 2020-03-04 NOTE — Progress Notes (Addendum)
Modified Barium Swallow Progress Note  Patient Details  Name: Frank Daugherty MRN: [id number] Date of Birth: 03/13/1946  Today's Date: 03/04/2020  Modified Barium Swallow completed.  Full report located under Chart Review in the Imaging Section.  Brief recommendations include the following:  Clinical Impression  Pt was seen for a modified barium swallow study and he presents with mild-moderate oropharyngeal dysphagia with resultant stagnant laryngeal penetration of thin liquid and nectar-thick liquid with suspected silent aspiration of thin liquid after the swallow.   Laryngeal penetration occurred before the swallow and was secondary to delayed swallow initiation and delayed laryngeal closure.  Pt was unable to clear penetrated material from his laryngeal vestibule despite a cued throat clear/cough.  No laryngeal penetration was observed with thin liquid via tsp, honey-thick liquid, puree, or regular solids.  Oral phase was remarkable for prolonged mastication of regular solids and prolonged AP transport with all solid trials.  Pt additionally presented with reduced lingual control resulting in premature spillage to the pharynx and reduced lingual strength resulting in trace-mild oral residue.  Pharyngeal phase was remarkable for reduced BOT retraction resulting in vallecular residue and reduced pharyngeal constriction resulting in posterior pharyngeal wall residue.  Swallow initiation was delayed at the level of the pyriform sinuses with thin and nectar-thick liquids and it was at the level of the valleculae with honey-thick liquid, puree, and regular solids.  Pharyngoesophageal phase was remarkable for suspected bolus retention in the cervical esophagus. Pt may benefit from a GI consult to evaluate further.    Recommend Dysphagia 2 (fine chop) solids and honey-thick liquids with medications administered crushed in puree.   Swallow Evaluation Recommendations   Recommended Consults: Consider  esophageal assessment;Consider GI evaluation   SLP Diet Recommendations: Honey thick liquids;Dysphagia 2 (Fine chop) solids   Liquid Administration via: Cup   Medication Administration: Crushed with puree   Supervision: Staff to assist with self feeding;Intermittent supervision to cue for compensatory strategies   Compensations: Slow rate;Small sips/bites   Postural Changes: Seated upright at 90 degrees;Remain semi-upright after after feeds/meals (Comment)   Oral Care Recommendations: Oral care BID   Other Recommendations: Prohibited food (jello, ice cream, thin soups);Order thickener from pharmacy;Remove water pitcher;Have oral suction available   Colin Mulders., M.S., CCC-SLP Acute Rehabilitation Services Office: [phone]  [place] 03/04/2020,3:52 PM

## 2020-03-04 NOTE — Progress Notes (Signed)
PROGRESS NOTE  Frank Daugherty  DOB: 1946-01-22  PCP: Patient, No Pcp Per PFX:[id number]  DOA: 03/08/2020 Admitted From: SNF  LOS: 3 days   Chief Complaint  Patient presents with  . Posssible Sepsis  . Failure To Thrive   Brief narrative: Patient is a 74 year old African-American male with PMH significant for prostate cancer for which patient has chronic indwelling catheter; also with history of hypertension and chronic anemia.  Patient is a skilled nursing facility resident. Per history, patient has been failing to thrive in the last 3 weeks.  On 3/23, patient was noted to have fever and altered mental status and sent to the ED.  In the ED, patient had fever up to 101.1, blood pressure down at 80s, tachycardic to 120s. Work-up showed normal WBC count, elevated lactic acid level to 2, sodium level 153, potassium 3.1, albumin low at 1.6, hemoglobin low at 6.4, platelet count down to 80s, creatinine at 1.09. Alkaline phosphatase elevated to 752. CT head is negative for acute infarct, mass or hemorrhage.    It also showed extensive mastoid air cell disease on the left with mild intracranial arterial vascular calcification and areas of paranasal sinus disease.   Chest x-ray is said to reveal chronic interstitial edema, without acute airspace disease. 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  Patient was admitted to hospitalist service for further evaluation and management.  Subjective: Patient was seen and examined this morning.  Elderly African-American male. Looks presently more awake every day.  He knows he is in the hospital.  Not in distress or pain.    Assessment/Plan: Sepsis likely secondary to complicated UTI associated with chronic Foley catheter -Met sepsis criteria on admission. -Urine culture is showing more than 100,000 CFU per mL of E. coli.  Pending sensitivity. -Currently on IV vancomycin and IV cefepime.  Discussed with pharmacy.  Was switched to IV  Ancef. -Continue maintenance IV hydration, lower rate today -Lactic acid level improved to normal  Hypernatremia -Sodium level was elevated to 153 on admission.  With dextrose drip, level improved to 151. -Last echo revealed, normal EF. -Monitor sodium level.  Monitor urine output.  Hypokalemia: -Potassium level improved with replacement.  Acute on chronic anemia -No evidence of active bleeding but hemoglobin was low at 6.4.  1 unit of PRBC was transfused.  Hemoglobin improved to 8.9. -Continue to monitor hemoglobin.  Encephalopathy, likely combined toxic and metabolic -Mental status gradually improving.    Advanced directive -Patient is a DNR. -Initially we had a discussion about starting hospice care.  Patient's clinical status has improved.  Patient's family asked him to be placed at inpatient hospice in [place] area which has also been difficult to find. -Plan is clinical status is improving, I think patient can be safely discharged to SNF when stable.  Palliative care services can be started over there.  DVT prophylaxis:  SCDs Antimicrobials:  IV Ancef Fluid: D5 at 50 mL/h Diet: Dysphagia 2 diet Mobility: PT eval ordered Family Communication:  None at bedside Discharge plan:  Anticipated date and disposition: More than 2 midnights Barriers: Ongoing work-up for sepsis  Consultants:  None  Antimicrobials: Anti-infectives (From admission, onward)   Start     Dose/Rate Route Frequency Ordered Stop   03/04/20 1400  ceFAZolin (ANCEF) IVPB 1 g/50 mL premix  Status:  Discontinued     1 g 100 mL/hr over 30 Minutes Intravenous Every 8 hours 03/04/20 1033 03/04/20 1035   03/04/20 1400  ceFAZolin (ANCEF) IVPB 2g/100 mL premix  2 g 200 mL/hr over 30 Minutes Intravenous Every 8 hours 03/04/20 1035     03/02/20 2200  ceFEPIme (MAXIPIME) 2 g in sodium chloride 0.9 % 100 mL IVPB  Status:  Discontinued     2 g 200 mL/hr over 30 Minutes Intravenous Every 8 hours 03/02/20 1226  03/04/20 1033   02/19/2020 2200  ceFEPIme (MAXIPIME) 2 g in sodium chloride 0.9 % 100 mL IVPB  Status:  Discontinued     2 g 200 mL/hr over 30 Minutes Intravenous 2 times daily 02/24/2020 2033 03/02/20 1226   02/09/2020 2045  vancomycin (VANCOCIN) IVPB 1000 mg/200 mL premix  Status:  Discontinued     1,000 mg 200 mL/hr over 60 Minutes Intravenous Daily 02/25/2020 2033 03/04/20 0841   02/13/2020 1345  ceFEPIme (MAXIPIME) 2 g in sodium chloride 0.9 % 100 mL IVPB     2 g 200 mL/hr over 30 Minutes Intravenous  Once 02/24/2020 1339 02/25/2020 1431        Code Status: DNR   Diet Order            DIET DYS 2 Room service appropriate? Yes with Assist; Fluid consistency: Honey Thick  Diet effective now              Infusions:  .  ceFAZolin (ANCEF) IV 2 g (03/04/20 1340)  . dextrose 5 % and 0.45% NaCl 75 mL/hr at 03/04/20 1062    Scheduled Meds: . Chlorhexidine Gluconate Cloth  6 each Topical Q0600  . insulin aspart  0-5 Units Subcutaneous QHS  . insulin aspart  0-9 Units Subcutaneous TID WC  . mupirocin ointment  1 application Nasal BID    PRN meds:    Objective: Vitals:   03/04/20 0454 03/04/20 1309  BP: (!) 144/97 (!) 125/97  Pulse: 86 (!) 104  Resp: 17   Temp: 97.7 F (36.5 C) (!) 97.5 F (36.4 C)  SpO2: 98% 100%    Intake/Output Summary (Last 24 hours) at 03/04/2020 1542 Last data filed at 03/04/2020 1400 Gross per 24 hour  Intake 1150.22 ml  Output 975 ml  Net 175.22 ml   Filed Weights   03/03/20 1109  Weight: 63 kg   Weight change:  Body mass index is 19.37 kg/m.   Physical Exam: General exam: Chronically sick looking.  Mental status improving. Not in physical distress Skin: No rashes, lesions or ulcers. HEENT: Atraumatic, normocephalic, supple neck, no obvious bleeding Lungs: Clear to auscultation bilaterally CVS: Tachycardic, no murmurs GI/Abd soft, nontender, nondistended, bowel sound present CNS: Alert, awake, able to answer simple yes/no  questions. Psychiatry: Unable to examine due to altered mentation Extremities: No edema, no calf tenderness  Data Review: I have personally reviewed the laboratory data and studies available.  Recent Labs  Lab 03/09/2020 1327 03/02/20 0500 03/04/20 0439  WBC 4.4 5.0 8.2  NEUTROABS 2.6  --  5.1  HGB 6.4* 9.0* 8.9*  HCT 20.6* 28.5* 27.7*  MCV 93.6 90.8 90.2  PLT 80* 75* 49*   Recent Labs  Lab 03/05/2020 1327 03/02/20 0500 03/04/20 0439 03/04/20 0724  NA 153* 151* 145 151*  K 3.1* 3.0* 3.0* 3.4*  CL 118* 122* 119* 122*  CO2 23 19* 17* 19*  GLUCOSE 107* 94 577* 205*  BUN 19 19 34* 36*  CREATININE 1.09 0.81 0.90 0.93  CALCIUM 7.3* 7.5* 7.0* 7.7*  MG  --  1.8  --   --   PHOS  --  2.5  --   --  Signed, Terrilee Croak, MD [facility] Pager: [phone] (Secure Chat preferred). 03/04/2020

## 2020-03-04 NOTE — Evaluation (Signed)
Physical Therapy Evaluation Patient Details Name: Frank Daugherty MRN: [id number] DOB: 1946/09/26 Today's Date: 03/04/2020   History of Present Illness  74yo male presenting from [facility] with hypernatremia, hypocalcemia, encephalopathy, possible sepsis and FTT. PMH A-fib with RVR, liver disease with coagulopathy, HTN, chronic anemia, bladder CA, and prostate CA with indwelling catheter  Clinical Impression  Pt admitted as above and presenting with functional mobility limitations 2* generalized weakness, poor endurance, balance deficits and slow processing cognitively.  Pt would benefit from follow up rehab at SNF level to maximize IND and safety.    Follow Up Recommendations SNF    Equipment Recommendations  None recommended by PT    Recommendations for Other Services       Precautions / Restrictions Precautions Precautions: Fall Restrictions Weight Bearing Restrictions: No      Mobility  Bed Mobility Overal bed mobility: Needs Assistance Bed Mobility: Supine to Sit     Supine to sit: Min assist;+2 for safety/equipment     General bed mobility comments: increased time and multimodal cues; assist to manage LEs and to bring trunk to upright sitting  Transfers Overall transfer level: Needs assistance Equipment used: Rolling walker (2 wheeled) Transfers: Sit to/from Stand Sit to Stand: From elevated surface;+2 safety/equipment;Min assist         General transfer comment: cues for transition position and use of UEs to self assist; physical assist to bring wt up and fwd and to balance in standing  Ambulation/Gait Ambulation/Gait assistance: Min assist;Mod assist;+2 safety/equipment Gait Distance (Feet): 18 Feet Assistive device: Rolling walker (2 wheeled) Gait Pattern/deviations: Step-to pattern;Decreased step length - right;Decreased step length - left;Shuffle;Trunk flexed Gait velocity: decr   General Gait Details: cues for posture, position from RW, basic  safety awareness; Physical assist for balance/support and RW management; chair follow for safety  Stairs            Wheelchair Mobility    Modified Rankin (Stroke Patients Only)       Balance Overall balance assessment: Needs assistance Sitting-balance support: Bilateral upper extremity supported;Feet supported Sitting balance-Leahy Scale: Fair     Standing balance support: Bilateral upper extremity supported Standing balance-Leahy Scale: Poor                               Pertinent Vitals/Pain Pain Assessment: No/denies pain    Home Living Family/patient expects to be discharged to:: Unsure                 Additional Comments: Pt from [facility]; possible dc to hospice center    Prior Function Level of [place]: Needs assistance   Gait / Transfers Assistance Needed: RW     Comments: Pt from [place] [facility]      Hand Dominance   Dominant Hand: Right    Extremity/Trunk Assessment   Upper Extremity Assessment Upper Extremity Assessment: Generalized weakness;LUE deficits/detail LUE Deficits / Details: L side weaker vs right    Lower Extremity Assessment Lower Extremity Assessment: Generalized weakness;LLE deficits/detail LLE Deficits / Details: L side weaker vs right    Cervical / Trunk Assessment Cervical / Trunk Assessment: Kyphotic  Communication   Communication: Expressive difficulties  Cognition Arousal/Alertness: Awake/alert Behavior During Therapy: Flat affect Overall Cognitive Status: No family/caregiver present to determine baseline cognitive functioning  General Comments: Delayed response       General Comments      Exercises     Assessment/Plan    PT Assessment Patient needs continued PT services  PT Problem List Decreased strength;Decreased activity tolerance;Decreased balance;Decreased mobility;Decreased cognition;Decreased knowledge of use of DME;Decreased  safety awareness       PT Treatment Interventions DME instruction;Gait training;Functional mobility training;Therapeutic activities;Therapeutic exercise;Balance training;Patient/family education    PT Goals (Current goals can be found in the Care Plan section)  Acute Rehab PT Goals Patient Stated Goal: OOB PT Goal Formulation: With patient Time For Goal Achievement: 03/18/20 Potential to Achieve Goals: Fair    Frequency Min 2X/week   Barriers to discharge        Co-evaluation               AM-PAC PT "6 Clicks" Mobility  Outcome Measure Help needed turning from your back to your side while in a flat bed without using bedrails?: A Little Help needed moving from lying on your back to sitting on the side of a flat bed without using bedrails?: A Lot Help needed moving to and from a bed to a chair (including a wheelchair)?: A Lot Help needed standing up from a chair using your arms (e.g., wheelchair or bedside chair)?: A Lot Help needed to walk in hospital room?: A Lot Help needed climbing 3-5 steps with a railing? : A Lot 6 Click Score: 13    End of Session Equipment Utilized During Treatment: Gait belt Activity Tolerance: Patient limited by fatigue Patient left: in chair;with call bell/phone within reach;with chair alarm set Nurse Communication: Mobility status PT Visit Diagnosis: Difficulty in walking, not elsewhere classified (R26.2);Muscle weakness (generalized) (M62.81)    Time: [id number] PT Time Calculation (min) (ACUTE ONLY): 21 min   Charges:   PT Evaluation $PT Eval Low Complexity: 1 Low          [place] Pager [phone] Office [phone]   Jorgen Wolfinger 03/04/2020, 12:48 PM

## 2020-03-04 NOTE — Progress Notes (Signed)
Critical glucose 577. On call MD paged. Awaiting orders

## 2020-03-04 NOTE — TOC Progression Note (Signed)
Transition of Care [facility]) - Progression Note    Patient Details  Name: Frank Daugherty MRN: [id number] Date of Birth: [birth year]  Transition of Care [facility]) CM/SW Contact  Norvella Loscalzo, Juliann Pulse, RN Phone Number: 03/04/2020, 11:49 AM  Clinical Narrative: Left vm w/contact person son Jafarr [phone]-to f/u on prior CSW request to [facility] for residential hospice-closer to [place] area.[facility] is still following.      Expected Discharge Plan: [place] Barriers to Discharge: No Barriers Identified  Expected Discharge Plan and Services Expected Discharge Plan: [place] In-house Referral: Clinical Social Work Discharge Planning Services: CM Consult Post Acute Care Choice: Hospice Living arrangements for the past 2 months: [place], Single Family Home                                       Social Determinants of Health (SDOH) Interventions    Readmission Risk Interventions No flowsheet data found.

## 2020-03-04 NOTE — TOC Progression Note (Signed)
Transition of Care [facility]) - Progression Note    Patient Details  Name: Frank Daugherty MRN: [id number] Date of Birth: 1946-07-05  Transition of Care [facility]) CM/SW Contact  Farin Buhman, Juliann Pulse, RN Phone Number: 03/04/2020, 3:42 PM  Clinical Narrative: Per attending patient's condition improving-d/c plan return back to SNF w/palliative care services to follow-must confirm this plan w/son Jafarr [phone]-he has not returned my call-will continue to f/u. [facility] rep Irine Seal is aware, & can accept back w/palliative care services.      Expected Discharge Plan: [place] Barriers to Discharge: No Barriers Identified  Expected Discharge Plan and Services Expected Discharge Plan: [place] In-house Referral: Clinical Social Work Discharge Planning Services: CM Consult Post Acute Care Choice: Hospice Living arrangements for the past 2 months: [place], Single Family Home                                       Social Determinants of Health (SDOH) Interventions    Readmission Risk Interventions No flowsheet data found.

## 2020-03-04 NOTE — Progress Notes (Addendum)
[facility]  Referral received for residential hospice.  Noted that [facility] and [facility] are following as well and son has not made decision as to which location he would prefer.  [facility] does not have a bed to offer today.  Will continue to follow to see what families decision is.  Venia Carbon RN, BSN, [facility] Liaison   **at this time, pt is not eligible for residential hospice at [facility] per [facility] MD.  Please let us know if we can assist with hospice at home.

## 2020-03-04 NOTE — TOC Progression Note (Signed)
Transition of Care [facility]) - Progression Note    Patient Details  Name: Frank Daugherty MRN: [id number] Date of Birth: 1946-08-03  Transition of Care [facility]) CM/SW Contact  Eunie Lawn, Juliann Pulse, RN Phone Number: 03/04/2020, 3:21 PM  Clinical Narrative:patient screened for hospice home @ [facility], & [place]-would not be appropriate for In[facility] residential hospice d/t-no unmanaged symptoms-pain,n/v,sob. Currently [facility] following.      Expected Discharge Plan: [place] Barriers to Discharge: No Barriers Identified  Expected Discharge Plan and Services Expected Discharge Plan: [place] In-house Referral: Clinical Social Work Discharge Planning Services: CM Consult Post Acute Care Choice: Hospice Living arrangements for the past 2 months: [place], Single Family Home                                       Social Determinants of Health (SDOH) Interventions    Readmission Risk Interventions No flowsheet data found.

## 2020-03-05 LAB — PHOSPHORUS: Phosphorus: 2 mg/dL — ABNORMAL LOW (ref 2.5–4.6)

## 2020-03-05 LAB — BASIC METABOLIC PANEL
Anion gap: 11 (ref 5–15)
BUN: 36 mg/dL — ABNORMAL HIGH (ref 8–23)
CO2: 18 mmol/L — ABNORMAL LOW (ref 22–32)
Calcium: 7.9 mg/dL — ABNORMAL LOW (ref 8.9–10.3)
Chloride: 126 mmol/L — ABNORMAL HIGH (ref 98–111)
Creatinine, Ser: 1 mg/dL (ref 0.61–1.24)
GFR calc Af Amer: >60 mL/min (ref >60)
GFR calc non Af Amer: >60 mL/min (ref >60)
Glucose, Bld: 166 mg/dL — ABNORMAL HIGH (ref 70–99)
Potassium: 3.2 mmol/L — ABNORMAL LOW (ref 3.5–5.1)
Sodium: 155 mmol/L — ABNORMAL HIGH (ref 135–145)

## 2020-03-05 LAB — GLUCOSE, CAPILLARY
Glucose-Capillary: 190 mg/dL — ABNORMAL HIGH (ref 70–99)
Glucose-Capillary: 158 mg/dL — ABNORMAL HIGH (ref 70–99)
Glucose-Capillary: 212 mg/dL — ABNORMAL HIGH (ref 70–99)
Glucose-Capillary: 127 mg/dL — ABNORMAL HIGH (ref 70–99)

## 2020-03-05 LAB — MAGNESIUM: Magnesium: 2 mg/dL (ref 1.7–2.4)

## 2020-03-05 MED ORDER — DEXTROSE 5 % IV SOLN: INTRAVENOUS | Status: DC

## 2020-03-05 NOTE — Progress Notes (Signed)
Observed patient picking at nose off and on, large clot passed from left nare, MD made aware, stated to continue to monitor

## 2020-03-05 NOTE — Progress Notes (Signed)
PROGRESS NOTE  Frank Daugherty  DOB: Oct 03, 1946  PCP: Patient, No Pcp Per OIT:[id number]  DOA: 03/09/2020 Admitted From: SNF  LOS: 4 days   Chief Complaint  Patient presents with  . Posssible Sepsis  . Failure To Thrive   Brief narrative: Patient is a 74 year old African-American male with PMH significant for prostate cancer for which patient has chronic indwelling catheter; also with history of hypertension and chronic anemia.  Patient is a skilled nursing facility resident. Per history, patient has been failing to thrive in the last 3 weeks.  On 3/23, patient was noted to have fever and altered mental status and sent to the ED.  In the ED, patient had fever up to 101.1, blood pressure down at 80s, tachycardic to 120s. Work-up showed normal WBC count, elevated lactic acid level to 2, sodium level 153, potassium 3.1, albumin low at 1.6, hemoglobin low at 6.4, platelet count down to 80s, creatinine at 1.09. Alkaline phosphatase elevated to 752. CT head is negative for acute infarct, mass or hemorrhage.    It also showed extensive mastoid air cell disease on the left with mild intracranial arterial vascular calcification and areas of paranasal sinus disease.   Chest x-ray is said to reveal chronic interstitial edema, without acute airspace disease. 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  Patient was admitted to hospitalist service for further evaluation and management.  Subjective: Patient was seen and examined this morning.  Elderly African-American male.  Awake, alert, knows he is in the hospital. This afternoon, patient had an episode of epistaxis.  RN noted earlier that he was picking his nose.  He is not on any blood thinners.  Assessment/Plan: Sepsis likely secondary to complicated UTI associated with chronic Foley catheter E. coli UTI -Met sepsis criteria on admission. -Urine culture grew more than 100,000 CFU per mL of E. coli.  -Broad-spectrum antibiotics switched to  IV Ancef.   Hypernatremia -Initial sodium level was 153 which improved but again worse at 155 today.  Switch IV fluid from D5 NS to only D5 at 100 mL/h.  Repeat blood work tomorrow.   -Last echo revealed, normal EF. -Monitor sodium level.  Monitor urine output.  Hypokalemia: -Potassium level improved with replacement.  Acute on chronic thrombocytopenia Epistaxis -One episode today.  Patient was seen picking his nose earlier. -Not on blood thinners.  Platelets level was 80 on presentation, noted to be falling down, 42 today.  We will keep an eye.  May need platelet transfusion if continues to drop or starts rebleeding again.  Acute on chronic anemia -No evidence of active bleeding but hemoglobin was low at 6.4.  1 unit of PRBC was transfused.  -Hemoglobin improved to 8.9. -Continue to monitor hemoglobin.  Encephalopathy, likely combined toxic and metabolic -Mental status gradually improving.    Advanced directive -Patient is a DNR.  -Initially we had a discussion about starting hospice care.  Patient's clinical status has improved.   -I believe the next 1 to 2 days patient will be stable enough to be discharged back to SNF.    DVT prophylaxis:  SCDs Antimicrobials:  IV Ancef Fluid: D5 at 100 mL/h Diet: Dysphagia 2 diet Mobility: PT eval appreciated Family Communication:  I called and updated patient's son this morning. Discharge plan:  Anticipated date and disposition: Hopefully back to SNF in 1 to 2 days Barriers: Hypernatremia  Consultants:  None  Antimicrobials: Anti-infectives (From admission, onward)   Start     Dose/Rate Route Frequency Ordered Stop  03/04/20 1400  ceFAZolin (ANCEF) IVPB 1 g/50 mL premix  Status:  Discontinued     1 g 100 mL/hr over 30 Minutes Intravenous Every 8 hours 03/04/20 1033 03/04/20 1035   03/04/20 1400  ceFAZolin (ANCEF) IVPB 2g/100 mL premix     2 g 200 mL/hr over 30 Minutes Intravenous Every 8 hours 03/04/20 1035     03/02/20  2200  ceFEPIme (MAXIPIME) 2 g in sodium chloride 0.9 % 100 mL IVPB  Status:  Discontinued     2 g 200 mL/hr over 30 Minutes Intravenous Every 8 hours 03/02/20 1226 03/04/20 1033   02/21/2020 2200  ceFEPIme (MAXIPIME) 2 g in sodium chloride 0.9 % 100 mL IVPB  Status:  Discontinued     2 g 200 mL/hr over 30 Minutes Intravenous 2 times daily 02/23/2020 2033 03/02/20 1226   02/18/2020 2045  vancomycin (VANCOCIN) IVPB 1000 mg/200 mL premix  Status:  Discontinued     1,000 mg 200 mL/hr over 60 Minutes Intravenous Daily 03/08/2020 2033 03/04/20 0841   02/21/2020 1345  ceFEPIme (MAXIPIME) 2 g in sodium chloride 0.9 % 100 mL IVPB     2 g 200 mL/hr over 30 Minutes Intravenous  Once 02/14/2020 1339 02/28/2020 1431        Code Status: DNR   Diet Order            DIET DYS 2 Room service appropriate? Yes with Assist; Fluid consistency: Honey Thick  Diet effective now              Infusions:  .  ceFAZolin (ANCEF) IV 2 g (03/05/20 1402)    Scheduled Meds: . Chlorhexidine Gluconate Cloth  6 each Topical Q0600  . insulin aspart  0-5 Units Subcutaneous QHS  . insulin aspart  0-9 Units Subcutaneous TID WC  . mupirocin ointment  1 application Nasal BID    PRN meds:    Objective: Vitals:   03/05/20 0830 03/05/20 1352  BP: 131/90 (!) 151/95  Pulse: 84 72  Resp: 20   Temp: (!) 97.5 F (36.4 C)   SpO2: 99% 98%    Intake/Output Summary (Last 24 hours) at 03/05/2020 1544 Last data filed at 03/05/2020 1454 Gross per 24 hour  Intake 844.61 ml  Output 2475 ml  Net -1630.39 ml   Filed Weights   03/03/20 1109  Weight: 63 kg   Weight change:  Body mass index is 19.37 kg/m.   Physical Exam: General exam: Chronically sick looking.  Mental status improving. Not in physical distress Skin: No rashes, lesions or ulcers. HEENT: Atraumatic, normocephalic, supple neck, no obvious bleeding Lungs: Clear to auscultation bilaterally CVS: Tachycardic, no murmurs GI/Abd soft, nontender, nondistended, bowel  sound present CNS: Alert, awake, able to answer simple yes/no questions. Psychiatry: Mood appropriate Extremities: No edema, no calf tenderness  Data Review: I have personally reviewed the laboratory data and studies available.  Recent Labs  Lab 02/16/2020 1327 03/02/20 0500 03/04/20 0439 03/05/20 0809  WBC 4.4 5.0 8.2 8.2  NEUTROABS 2.6  --  5.1 5.0  HGB 6.4* 9.0* 8.9* 10.3*  HCT 20.6* 28.5* 27.7* 32.5*  MCV 93.6 90.8 90.2 91.3  PLT 80* 75* 49* 42*   Recent Labs  Lab 02/19/2020 1327 03/02/20 0500 03/04/20 0439 03/04/20 0724 03/05/20 0809  NA 153* 151* 145 151* 155*  K 3.1* 3.0* 3.0* 3.4* 3.2*  CL 118* 122* 119* 122* 126*  CO2 23 19* 17* 19* 18*  GLUCOSE 107* 94 577* 205* 166*  BUN  19 19 34* 36* 36*  CREATININE 1.09 0.81 0.90 0.93 1.00  CALCIUM 7.3* 7.5* 7.0* 7.7* 7.9*  MG  --  1.8  --   --  2.0  PHOS  --  2.5  --   --  2.0*   Signed, Terrilee Croak, MD [facility] Pager: [phone] (Secure Chat preferred). 03/05/2020

## 2020-03-05 NOTE — Progress Notes (Signed)
[facility] Documentation  Liaison notes that pt will dc to SNF with palliative. [place] liaison will continue to follow pt during course in hospital and will notify [facility] Palliative dept once pt is dc'd so that admission visit can be scheduled.   Please do not hesitate to outreach with any questions and thank you for the referral.   Freddie Breech, RN [facility] Liaison [phone]

## 2020-03-06 ENCOUNTER — Inpatient Hospital Stay (HOSPITAL_COMMUNITY): Payer: Medicare Other

## 2020-03-06 LAB — BLOOD GAS, ARTERIAL
Acid-base deficit: 2.8 mmol/L — ABNORMAL HIGH (ref 0.0–2.0)
Bicarbonate: 20 mmol/L (ref 20.0–28.0)
FIO2: 28
O2 Saturation: 97 %
Patient temperature: 98.6
pCO2 arterial: 29.3 mmHg — ABNORMAL LOW (ref 32.0–48.0)
pH, Arterial: 7.448 (ref 7.350–7.450)
pO2, Arterial: 78.4 mmHg — ABNORMAL LOW (ref 83.0–108.0)

## 2020-03-06 LAB — BASIC METABOLIC PANEL
Anion gap: 10 (ref 5–15)
BUN: 29 mg/dL — ABNORMAL HIGH (ref 8–23)
CO2: 18 mmol/L — ABNORMAL LOW (ref 22–32)
Calcium: 7.4 mg/dL — ABNORMAL LOW (ref 8.9–10.3)
Chloride: 127 mmol/L — ABNORMAL HIGH (ref 98–111)
Creatinine, Ser: 0.83 mg/dL (ref 0.61–1.24)
GFR calc Af Amer: >60 mL/min (ref >60)
GFR calc non Af Amer: >60 mL/min (ref >60)
Glucose, Bld: 184 mg/dL — ABNORMAL HIGH (ref 70–99)
Potassium: 2.8 mmol/L — ABNORMAL LOW (ref 3.5–5.1)
Sodium: 155 mmol/L — ABNORMAL HIGH (ref 135–145)

## 2020-03-06 LAB — CBC WITH DIFFERENTIAL/PLATELET
Abs Immature Granulocytes: 0.43 10*3/uL — ABNORMAL HIGH (ref 0.00–0.07)
Band Neutrophils: 3 %
Basophils Absolute: 0 10*3/uL (ref 0.0–0.1)
Basophils Relative: 0 %
Blasts: 0 %
Eosinophils Absolute: 0.1 10*3/uL (ref 0.0–0.5)
Eosinophils Relative: 1 %
HCT: 28.6 % — ABNORMAL LOW (ref 39.0–52.0)
Hemoglobin: 9 g/dL — ABNORMAL LOW (ref 13.0–17.0)
Lymphocytes Relative: 15 %
Lymphs Abs: 1.1 10*3/uL (ref 0.7–4.0)
MCH: 28.6 pg (ref 26.0–34.0)
MCHC: 31.5 g/dL (ref 30.0–36.0)
MCV: 90.8 fL (ref 80.0–100.0)
Metamyelocytes Relative: 4 %
Monocytes Absolute: 0.1 10*3/uL (ref 0.1–1.0)
Monocytes Relative: 1 %
Myelocytes: 2 %
Neutro Abs: 5.4 10*3/uL (ref 1.7–7.7)
Neutrophils Relative %: 74 %
Other: 0 %
Platelets: 34 10*3/uL — ABNORMAL LOW (ref 150–400)
Promyelocytes Relative: 0 %
RBC: 3.15 MIL/uL — ABNORMAL LOW (ref 4.22–5.81)
RDW: 18.6 % — ABNORMAL HIGH (ref 11.5–15.5)
WBC: 7.1 10*3/uL (ref 4.0–10.5)
nRBC: 4.8 % — ABNORMAL HIGH (ref 0.0–0.2)
nRBC: 5 /100 WBC — ABNORMAL HIGH

## 2020-03-06 LAB — GLUCOSE, CAPILLARY
Glucose-Capillary: 171 mg/dL — ABNORMAL HIGH (ref 70–99)
Glucose-Capillary: 138 mg/dL — ABNORMAL HIGH (ref 70–99)

## 2020-03-06 MED ORDER — FUROSEMIDE 10 MG/ML IJ SOLN
20.0000 mg | Freq: Once | INTRAMUSCULAR | Status: AC
  Administered 2020-03-06: 20 mg via INTRAVENOUS
  Filled 2020-03-06: qty 2

## 2020-03-06 MED ORDER — MORPHINE SULFATE (PF) 2 MG/ML IV SOLN
1.0000 mg | Freq: Once | INTRAVENOUS | Status: AC
  Administered 2020-03-06: 1 mg via INTRAVENOUS
  Filled 2020-03-06: qty 1

## 2020-03-06 MED ORDER — POTASSIUM CHLORIDE 10 MEQ/100ML IV SOLN
10.0000 meq | INTRAVENOUS | Status: AC
  Administered 2020-03-06 (×2): 10 meq via INTRAVENOUS
  Filled 2020-03-06: qty 100

## 2020-03-08 LAB — CBC WITH DIFFERENTIAL/PLATELET
Abs Immature Granulocytes: 1.23 10*3/uL — ABNORMAL HIGH (ref 0.00–0.07)
Basophils Absolute: 0 10*3/uL (ref 0.0–0.1)
Basophils Relative: 0 %
Eosinophils Absolute: 0 10*3/uL (ref 0.0–0.5)
Eosinophils Relative: 0 %
HCT: 32.5 % — ABNORMAL LOW (ref 39.0–52.0)
Hemoglobin: 10.3 g/dL — ABNORMAL LOW (ref 13.0–17.0)
Immature Granulocytes: 15 %
Lymphocytes Relative: 22 %
Lymphs Abs: 1.8 10*3/uL (ref 0.7–4.0)
MCH: 28.9 pg (ref 26.0–34.0)
MCHC: 31.7 g/dL (ref 30.0–36.0)
MCV: 91.3 fL (ref 80.0–100.0)
Monocytes Absolute: 0.2 10*3/uL (ref 0.1–1.0)
Monocytes Relative: 2 %
Neutro Abs: 5 10*3/uL (ref 1.7–7.7)
Neutrophils Relative %: 61 %
Platelets: 42 10*3/uL — ABNORMAL LOW (ref 150–400)
RBC: 3.56 MIL/uL — ABNORMAL LOW (ref 4.22–5.81)
RDW: 18.8 % — ABNORMAL HIGH (ref 11.5–15.5)
WBC Morphology: INCREASED
WBC: 8.2 10*3/uL (ref 4.0–10.5)
nRBC: 2.7 % — ABNORMAL HIGH (ref 0.0–0.2)

## 2020-03-10 NOTE — Discharge Summary (Signed)
Death Summary  Frank Daugherty [id number] DOB: 06-07-1946 DOA: 03-18-2020  PCP: Patient, No Pcp Per  Admit date: 2020-03-18 Date of Death: March 23, 2020 Time of Death: 12:33 pm Notification: Patient, No Pcp Per notified of death of March 23, 2020   History of present illness:  Patient is a 74 year old African-American male with PMH significant for prostate cancer for which patient has chronic indwelling catheter; also with history of hypertension and chronic anemia. Patient is a skilled nursing facility resident. Per history, patient has been failing to thrive in the last 3 weeks.  On 19-Mar-2023, patient was noted to have fever and altered mental status and sent to the ED.  In the ED, patient had fever up to 101.1, blood pressure down at 80s, tachycardic to 120s. Work-up showed normal WBC count, elevated lactic acid level to 2, sodium level 153, potassium 3.1, albumin low at 1.6, hemoglobin low at 6.4, platelet count down to 80s, creatinine at 1.09. Alkaline phosphatase elevated to 752. CT head is negative for acute infarct, mass or hemorrhage.   It also showed extensive mastoid air cell disease on the left with mild intracranial arterial vascular calcification and areas of paranasal sinus disease.  Chest x-ray is said to reveal chronic interstitial edema, without acute airspace disease. 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  Patient was admitted to hospitalist service for further evaluation and management.  Patient was managed for sepsis secondary to UTI.  Urine culture grew E. coli.  With appropriate antibiotics, IV fluid, he started to improve.Marland Kitchen  However on the night of 3/27, patient started to be altered again.  He probably started to aspirate his secretions. His respiratory status worsened.  Appropriate investigations and blood work were done.  In the next several hours, patient's respiratory status and mental status did not improve.  Patient passed away at 12:33 PM. Family was informed  in the morning while he was declining. I again called and spoke to patient's wife as well as son after he expired.  Final Diagnoses:  Aspiration pneumonia Sepsis secondary to E. coli UTI related to chronic Foley catheter Acute encephalopathy Prostate cancer Acute hypernatremia Acute on chronic thrombocytopenia Epistaxis  The results of significant diagnostics from this hospitalization (including imaging, microbiology, ancillary and laboratory) are listed below for reference.    Significant Diagnostic Studies: CT Head Wo Contrast  Result Date: 18-Mar-2020 CLINICAL DATA:  Fever with altered mental status. History of bladder carcinoma. EXAM: CT HEAD WITHOUT CONTRAST TECHNIQUE: Contiguous axial images were obtained from the base of the skull through the vertex without intravenous contrast. COMPARISON:  None. FINDINGS: Brain: There is moderate diffuse atrophy. There is no intracranial mass, hemorrhage, extra-axial fluid collection, or midline shift. 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 There is patchy small vessel disease in the centra semiovale bilaterally. No evident acute infarct. Vascular: No hyperdense vessel. There is calcification in each carotid siphon region. Skull: The bony calvarium appears intact. Sinuses/Orbits: There is opacification of anterior left ethmoid air cell. There is mucosal thickening in the posterior right sphenoid sinus region. Other visualized paranasal sinuses are clear. Visualized orbits appear symmetric bilaterally. Other: There is diffuse opacification of mastoid air cells on the left. Mastoids on the right are clear. IMPRESSION: 1. Atrophy with periventricular small vessel disease. 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 No acute infarct. No mass or hemorrhage. 2.  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 3.  Mild intracranial intracranial arterial vascular calcification. 4.   Areas of paranasal sinus disease. Electronically Signed   By: Lowella Grip III M.D.   On: 02/23/2020 18:00   DG Chest Port 1 View  Result Date: 03-29-2020 CLINICAL DATA:  Dyspnea EXAM: PORTABLE CHEST 1 VIEW COMPARISON:  02/27/2020 FINDINGS: Bilateral small pleural effusions. Mild bilateral interstitial thickening. No pneumothorax. Bibasilar atelectasis. Stable cardiomediastinal silhouette. No aggressive osseous lesion. IMPRESSION: Bilateral small pleural effusions with bibasilar atelectasis. Electronically Signed   By: Kathreen Devoid   On: Mar 29, 2020 08:51   DG Chest Port 1 View  Result Date: 02/11/2020 CLINICAL DATA:  Altered level of consciousness, fever EXAM: PORTABLE CHEST 1 VIEW COMPARISON:  01/28/2020 FINDINGS: Single frontal view of the chest demonstrates an unremarkable cardiac silhouette. Interstitial prominence unchanged. No airspace disease, effusion, or pneumothorax. Diffuse bony sclerosis consistent with known metastatic prostate cancer. IMPRESSION: 1. Chronic interstitial edema.  No acute airspace disease. 2. Continued widespread bony metastases. Electronically Signed   By: Randa Ngo M.D.   On: 02/20/2020 13:55   DG Swallowing Func-Speech Pathology  Result Date: 03/04/2020 Objective Swallowing Evaluation: Type of Study: MBS-Modified Barium Swallow Study  Patient Details Name: Frank Daugherty MRN: [id number] Date of Birth: 1946-05-07 Today's Date: 03/04/2020 Time: SLP Start Time (ACUTE ONLY): 1415 -SLP Stop Time (ACUTE ONLY): 1435 SLP Time Calculation (min) (ACUTE ONLY): 20 min Past Medical History: Past Medical History: Diagnosis Date . Chronic anemia  . Hypertension  Past Surgical History: No past surgical history on file. HPI: 74 yo male adm to [facility] with AMS - UTI - chronic indwelling catheter - with resultant sepsis and encephalopathy causing lethargy. Pt has prostate cancer with widespread metastasis per chart review.  CT head negative.  CXR negative for acute change but shows mets.   Oncologist recommended comfort care/hospice.  Swallow eval ordered received.  Subjective: Pt was awake/alert Assessment / Plan / Recommendation CHL IP CLINICAL IMPRESSIONS 03/04/2020 Clinical Impression Pt was seen for a modified barium swallow study and he presents with mild-moderate oropharyngeal dysphagia with resultant stagnant laryngeal penetration of thin liquid and nectar-thick liquid with suspected silent aspiration of thin liquid after the swallow.  Laryngeal penetration occurred before the swallow and was secondary to delayed swallow initiation and delayed laryngeal closure.  Pt was unable to clear penetrated material from his laryngeal vestibule despite a cued throat clear/cough.  No laryngeal penetration was observed with thin liquid via tsp, honey-thick liquid, puree, or regular solids.  Oral phase was remarkable for prolonged mastication of regular solids and prolonged AP transport with all solid trials.  Pt additionally presented with reduced lingual control resulting in premature spillage to the pharynx and reduced lingual strength resulting in trace-mild oral residue.  Pharyngeal phase was remarkable for reduced BOT retraction resulting in vallecular residue and reduced pharyngeal constriction resulting in posterior pharyngeal wall residue.  Swallow initiation was delayed at the level of the pyriform sinuses with thin and nectar-thick liquids and it was at the level of the valleculae with honey-thick liquid, puree, and regular solids.  Pharyngoesophageal phase was remarkable for suspected bolus retention in the cervical esophagus. Pt may benefit from a GI consult to evaluate further.  Recommend Dysphagia 2 (fine chop) solids and honey-thick liquids with medications administered crushed in puree. SLP Visit Diagnosis Dysphagia, oropharyngeal phase (R13.12) Attention and concentration deficit following -- Frontal lobe and executive function deficit following -- Impact on safety and function Moderate  aspiration risk   CHL IP TREATMENT RECOMMENDATION 03/04/2020 Treatment Recommendations Therapy  as outlined in treatment plan below   Prognosis 03/04/2020 Prognosis for Safe Diet Advancement Fair Barriers to Reach Goals -- Barriers/Prognosis Comment -- CHL IP DIET RECOMMENDATION 03/04/2020 SLP Diet Recommendations Honey thick liquids;Dysphagia 2 (Fine chop) solids Liquid Administration via Cup Medication Administration Crushed with puree Compensations Slow rate;Small sips/bites Postural Changes Seated upright at 90 degrees;Remain semi-upright after after feeds/meals (Comment)   CHL IP OTHER RECOMMENDATIONS 03/04/2020 Recommended Consults Consider esophageal assessment;Consider GI evaluation Oral Care Recommendations Oral care BID Other Recommendations Prohibited food (jello, ice cream, thin soups);Order thickener from pharmacy;Remove water pitcher;Have oral suction available   CHL IP FOLLOW UP RECOMMENDATIONS 03/04/2020 Follow up Recommendations Skilled Nursing facility   [facility] IP FREQUENCY AND DURATION 03/04/2020 Speech Therapy Frequency (ACUTE ONLY) min 2x/week Treatment Duration 2 weeks      CHL IP ORAL PHASE 03/04/2020 Oral Phase Impaired Oral - Pudding Teaspoon -- Oral - Pudding Cup -- Oral - Honey Teaspoon -- Oral - Honey Cup Lingual/palatal residue;Premature spillage;Decreased bolus cohesion Oral - Nectar Teaspoon -- Oral - Nectar Cup Premature spillage;Lingual/palatal residue Oral - Nectar Straw Lingual/palatal residue;Premature spillage Oral - Thin Teaspoon Lingual/palatal residue;Premature spillage Oral - Thin Cup Premature spillage;Lingual/palatal residue;Piecemeal swallowing Oral - Thin Straw Lingual/palatal residue;Piecemeal swallowing;Premature spillage Oral - Puree Delayed oral transit;Piecemeal swallowing;Lingual/palatal residue Oral - Mech Soft -- Oral - Regular Impaired mastication;Delayed oral transit;Lingual/palatal residue Oral - Multi-Consistency -- Oral - Pill -- Oral Phase - Comment --  CHL IP  PHARYNGEAL PHASE 03/04/2020 Pharyngeal Phase Impaired Pharyngeal- Pudding Teaspoon -- Pharyngeal -- Pharyngeal- Pudding Cup -- Pharyngeal -- Pharyngeal- Honey Teaspoon -- Pharyngeal -- Pharyngeal- Honey Cup Delayed swallow initiation-vallecula;Pharyngeal residue - valleculae;Pharyngeal residue - pyriform;Pharyngeal residue - posterior pharnyx;Pharyngeal residue - cp segment;Lateral channel residue;Reduced pharyngeal peristalsis;Reduced tongue base retraction Pharyngeal Material does not enter airway Pharyngeal- Nectar Teaspoon -- Pharyngeal -- Pharyngeal- Nectar Cup Penetration/Aspiration before swallow;Reduced tongue base retraction;Reduced airway/laryngeal closure;Reduced pharyngeal peristalsis;Delayed swallow initiation-pyriform sinuses;Pharyngeal residue - valleculae;Pharyngeal residue - posterior pharnyx;Lateral channel residue Pharyngeal Material enters airway, remains ABOVE vocal cords and not ejected out Pharyngeal- Nectar Straw Delayed swallow initiation-pyriform sinuses;Reduced airway/laryngeal closure;Reduced tongue base retraction;Reduced pharyngeal peristalsis;Penetration/Aspiration before swallow;Pharyngeal residue - valleculae;Pharyngeal residue - posterior pharnyx;Pharyngeal residue - cp segment Pharyngeal Material enters airway, remains ABOVE vocal cords and not ejected out Pharyngeal- Thin Teaspoon Delayed swallow initiation-pyriform sinuses;Pharyngeal residue - pyriform;Reduced tongue base retraction Pharyngeal Material does not enter airway Pharyngeal- Thin Cup Delayed swallow initiation-pyriform sinuses;Penetration/Aspiration before swallow;Pharyngeal residue - posterior pharnyx;Reduced tongue base retraction;Reduced airway/laryngeal closure;Reduced pharyngeal peristalsis Pharyngeal Material enters airway, remains ABOVE vocal cords and not ejected out Pharyngeal- Thin Straw Delayed swallow initiation-pyriform sinuses;Penetration/Aspiration before swallow;Penetration/Aspiration during  swallow;Penetration/Apiration after swallow;Pharyngeal residue - posterior pharnyx;Reduced airway/laryngeal closure;Reduced tongue base retraction;Reduced pharyngeal peristalsis;Trace aspiration Pharyngeal Material enters airway, CONTACTS cords and not ejected out;Material enters airway, passes BELOW cords without attempt by patient to eject out (silent aspiration) Pharyngeal- Puree Delayed swallow initiation-vallecula;Reduced tongue base retraction;Reduced pharyngeal peristalsis;Pharyngeal residue - valleculae;Pharyngeal residue - posterior pharnyx Pharyngeal Material does not enter airway Pharyngeal- Mechanical Soft -- Pharyngeal -- Pharyngeal- Regular Delayed swallow initiation-vallecula;Reduced tongue base retraction;Reduced pharyngeal peristalsis;Pharyngeal residue - valleculae;Pharyngeal residue - posterior pharnyx Pharyngeal Material does not enter airway Pharyngeal- Multi-consistency -- Pharyngeal -- Pharyngeal- Pill -- Pharyngeal -- Pharyngeal Comment --  CHL IP CERVICAL ESOPHAGEAL PHASE 03/04/2020 Cervical Esophageal Phase Impaired Pudding Teaspoon -- Pudding Cup -- Honey Teaspoon -- Honey Cup -- Nectar Teaspoon -- Nectar Cup -- Nectar Straw -- Thin Teaspoon -- Thin Cup (No Data) Thin Straw -- Puree --  Mechanical Soft -- Regular -- Multi-consistency -- Pill -- Cervical Esophageal Comment Suspected retention in the cervical esophagus Colin Mulders M.S., CCC-SLP Acute Rehabilitation Services Office: [phone] [place] 03/04/2020, 3:55 PM               Microbiology: Recent Results (from the past 240 hour(s))  Urine culture     Status: Abnormal   Collection Time: 02/27/2020  1:27 PM   Specimen: Urine, Catheterized  Result Value Ref Range Status   Specimen Description   Final    URINE, CATHETERIZED Performed at [facility] [address], [place], [place] [address]    Special Requests   Final    NONE Performed at [facility], [place] [address],  [place], [place] [address]    Culture >=100,000 COLONIES/mL ESCHERICHIA COLI (A)  Final   Report Status 03/04/2020 FINAL  Final   Organism ID, Bacteria ESCHERICHIA COLI (A)  Final      Susceptibility   Escherichia coli - MIC*    AMPICILLIN >=32 RESISTANT Resistant     CEFAZOLIN <=4 SENSITIVE Sensitive     CEFTRIAXONE <=0.25 SENSITIVE Sensitive     CIPROFLOXACIN <=0.25 SENSITIVE Sensitive     GENTAMICIN <=1 SENSITIVE Sensitive     IMIPENEM <=0.25 SENSITIVE Sensitive     NITROFURANTOIN <=16 SENSITIVE Sensitive     TRIMETH/SULFA <=20 SENSITIVE Sensitive     AMPICILLIN/SULBACTAM <=2 SENSITIVE Sensitive     PIP/TAZO <=4 SENSITIVE Sensitive     * >=100,000 COLONIES/mL ESCHERICHIA COLI  SARS CORONAVIRUS 2 (TAT 6-24 HRS) Nasopharyngeal Nasopharyngeal Swab     Status: None   Collection Time: 03/03/2020  3:48 PM   Specimen: Nasopharyngeal Swab  Result Value Ref Range Status   SARS Coronavirus 2 NEGATIVE NEGATIVE Final    Comment: (NOTE) SARS-CoV-2 target nucleic acids are NOT DETECTED. The SARS-CoV-2 RNA is generally detectable in upper and lower respiratory specimens during the acute phase of infection. Negative results do not preclude SARS-CoV-2 infection, do not rule out co-infections with other pathogens, and should not be used as the sole basis for treatment or other patient management decisions. Negative results must be combined with clinical observations, patient history, and epidemiological information. The expected result is Negative. Fact Sheet for Patients: SugarRoll.be Fact Sheet for Healthcare Providers: https://www.woods-mathews.com/ This test is not yet approved or cleared by the [place] FDA and  has been authorized for detection and/or diagnosis of SARS-CoV-2 by FDA under an Emergency Use Authorization (EUA). This EUA will remain  in effect (meaning this test can be used) for the duration of the COVID-19 declaration under Section 56  4(b)(1) of the Act, 21 U.S.C. section 360bbb-3(b)(1), unless the authorization is terminated or revoked sooner. Performed at [facility], [place] [address] [place], [place] [address]   MRSA PCR Screening     Status: Abnormal   Collection Time: 03/03/20  2:49 AM   Specimen: Nasopharyngeal  Result Value Ref Range Status   MRSA by PCR POSITIVE (A) NEGATIVE Final    Comment:        The GeneXpert MRSA Assay (FDA approved for NASAL specimens only), is one component of a comprehensive MRSA colonization surveillance program. It is not intended to diagnose MRSA infection nor to guide or monitor treatment for MRSA infections. RESULT CALLED TO, READ BACK BY AND VERIFIED WITH: THERMAN,T. @0943  ON 3.25.2021 BY [facility] Performed at [facility], [place] [address], [place], [place] [address]  Labs: Basic Metabolic Panel: Recent Labs  Lab 03/02/20 0500 03/02/20 0500 03/04/20 0439 03/04/20 0439 03/04/20 0724 03/04/20 0724 03/05/20 0809 03/31/2020 0440  NA 151*  --  145  --  151*  --  155* 155*  K 3.0*   < > 3.0*   < > 3.4*   < > 3.2* 2.8*  CL 122*  --  119*  --  122*  --  126* 127*  CO2 19*  --  17*  --  19*  --  18* 18*  GLUCOSE 94  --  577*  --  205*  --  166* 184*  BUN 19  --  34*  --  36*  --  36* 29*  CREATININE 0.81  --  0.90  --  0.93  --  1.00 0.83  CALCIUM 7.5*  --  7.0*  --  7.7*  --  7.9* 7.4*  MG 1.8  --   --   --   --   --  2.0  --   PHOS 2.5  --   --   --   --   --  2.0*  --    < > = values in this interval not displayed.   Liver Function Tests: Recent Labs  Lab 02/14/2020 1327  AST 36  ALT 18  ALKPHOS 752*  BILITOT 1.9*  PROT 5.6*  ALBUMIN 1.6*   No results for input(s): LIPASE, AMYLASE in the last 168 hours. No results for input(s): AMMONIA in the last 168 hours. CBC: Recent Labs  Lab 03/02/2020 1327 03/02/20 0500 03/04/20 0439 03/05/20 0809 2020-03-31 0440  WBC 4.4 5.0 8.2 8.2 7.1  NEUTROABS 2.6  --  5.1 5.0 5.4  HGB 6.4* 9.0*  8.9* 10.3* 9.0*  HCT 20.6* 28.5* 27.7* 32.5* 28.6*  MCV 93.6 90.8 90.2 91.3 90.8  PLT 80* 75* 49* 42* 34*   Cardiac Enzymes: No results for input(s): CKTOTAL, CKMB, CKMBINDEX, TROPONINI in the last 168 hours. D-Dimer No results for input(s): DDIMER in the last 72 hours. BNP: Invalid input(s): POCBNP CBG: Recent Labs  Lab 03/05/20 1207 03/05/20 1621 03/05/20 2206 03/31/2020 0800 March 31, 2020 1205  GLUCAP 158* 212* 127* 171* 138*   Anemia work up No results for input(s): VITAMINB12, FOLATE, FERRITIN, TIBC, IRON, RETICCTPCT in the last 72 hours. Urinalysis    Component Value Date/Time   COLORURINE AMBER (A) 03/03/2020 1327   APPEARANCEUR CLOUDY (A) 02/08/2020 1327   LABSPEC 1.015 02/23/2020 1327   PHURINE 5.0 02/20/2020 1327   GLUCOSEU NEGATIVE 02/23/2020 1327   HGBUR SMALL (A) 03/08/2020 1327   BILIRUBINUR NEGATIVE 02/10/2020 1327   KETONESUR NEGATIVE 02/29/2020 1327   PROTEINUR 30 (A) 02/16/2020 1327   NITRITE NEGATIVE 02/14/2020 1327   LEUKOCYTESUR MODERATE (A) 02/16/2020 1327   Sepsis Labs Invalid input(s): PROCALCITONIN,  WBC,  LACTICIDVEN     SIGNED:  Terrilee Croak, MD  [facility] 03/31/2020, 1:10 PM

## 2020-03-10 NOTE — Progress Notes (Signed)
Notified Lab that ABG being sent for analysis. 

## 2020-03-10 NOTE — Progress Notes (Signed)
Hospitalist on call paged about pts respirations being 36. Crackles auscultated/ Orderrs given

## 2020-03-10 NOTE — Progress Notes (Signed)
Breathing labored this am.  Patient somnolent, wakes up on touch stimulus.  Asleep quickly. On room air.  Maintaining oxygen saturation. But labored breathing with respiratory rate more than 30 to maintain O2 saturation. On auscultation, he has rattles on both sides but I think they are mostly transmitted throat sounds. Some old clots coming out of the left nostril  Chart reviewed. Just before 6 AM, RN noted crackles and labored breathing. IV Lasix 20 mg was given. I do not see any chest x-ray performed.  I am not sure why morphine IV was given. I am not sure why supplemental oxygen was not started. I am not sure why IV fluid was not held.  Patient is somnolent now probably because of the effect of IV morphine, likely to wear off in next hour or 2. Obtain chest x-ray Obtain ABG. Frequent suctioning. Monitor nasal bleeding. Platelet level falling down, 34 today.  If respiratory status improves, will transfuse platelets later today. His sodium level has been running high and that is the reason he was on dextrose drip.  With respiratory distress at this time, will hold IV fluid for now. D/w RN Marylin Crosby at bedside.

## 2020-03-10 NOTE — Progress Notes (Signed)
At or around 12 noon observed patient with nose bleed and pooling of blood in mouth, suctioned small amount of blood from mouth.  MD made aware.  Provided ADL care to patient and repositioning, breathing became more shallow and agonal, with respirations ceasing at 1233pm, two nurses pronounced death.  MD made aware and notified family.

## 2020-03-10 DEATH — deceased

## 2021-06-11 IMAGING — US US ABDOMEN LIMITED
1 series · 14 of 25 positions shown · non-contrast
Comparison: None.

Chest x-ray 01/05/2020

CLINICAL DATA: Abdominal pain for 2 weeks

EXAM:
ULTRASOUND ABDOMEN LIMITED RIGHT UPPER QUADRANT

[Series 1: us abdomen limited · 72 acquisitions, 14 frames shown]
[im 1/72]
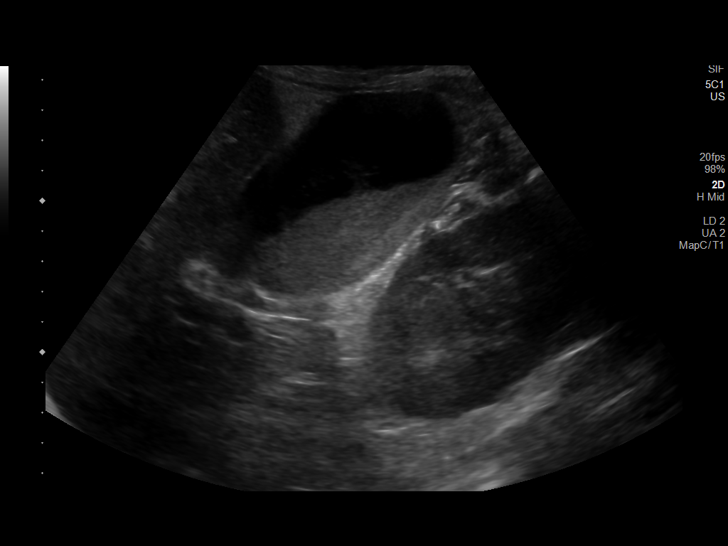
[im 6/72]
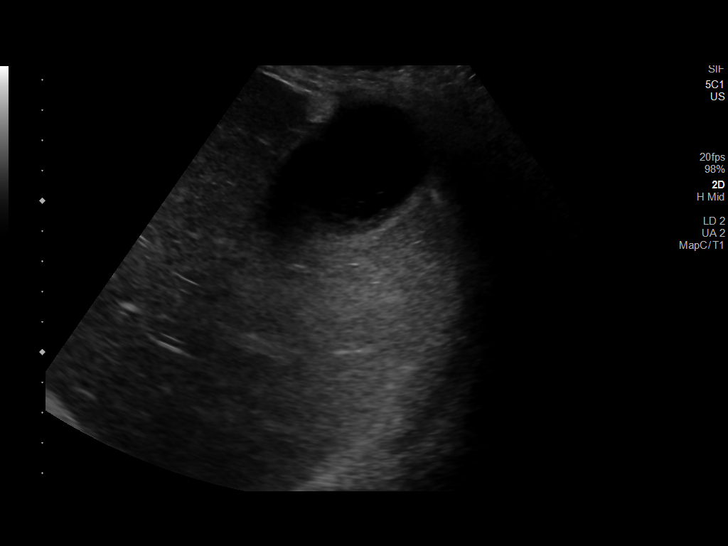
[im 12/72]
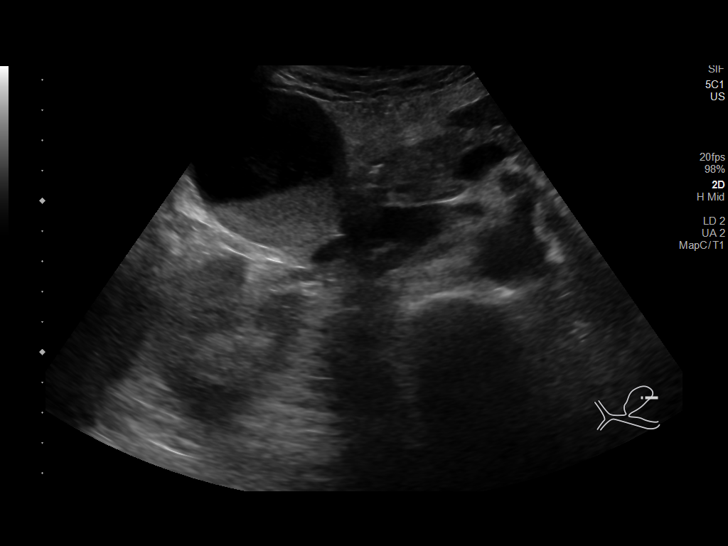
[im 18/72]
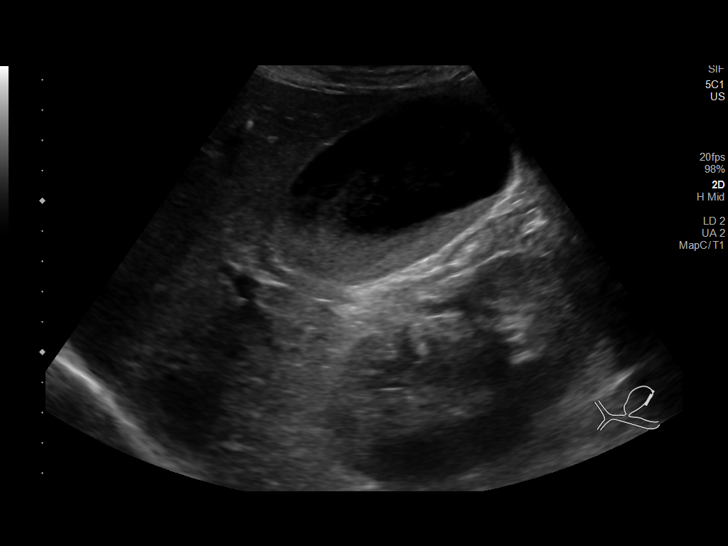
[im 24/72]
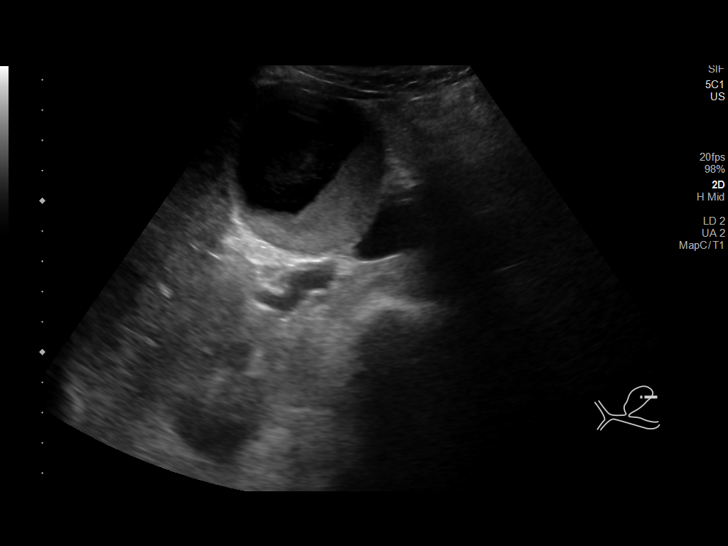
[im 27/72]
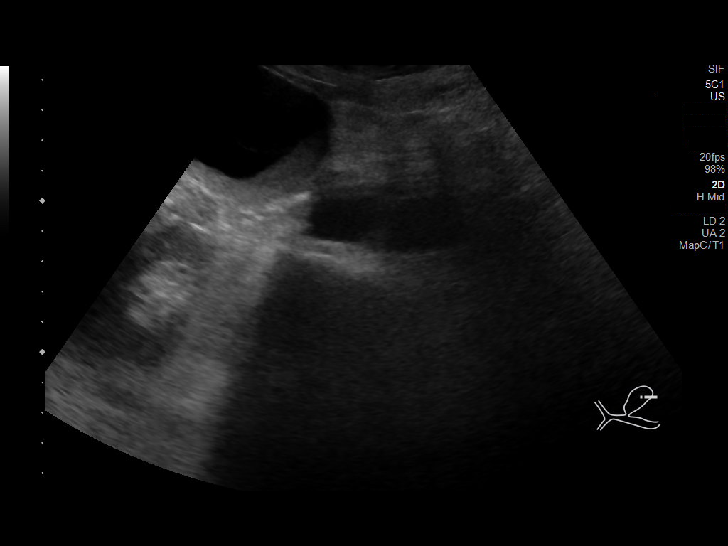
[im 33/72]
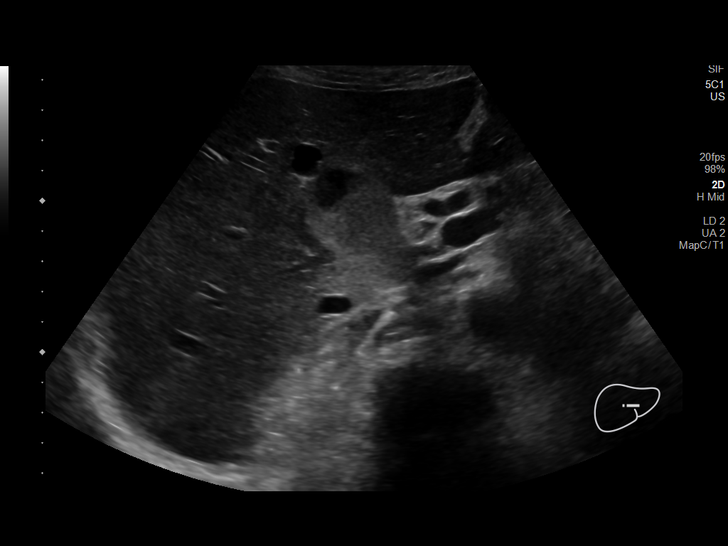
[im 39/72]
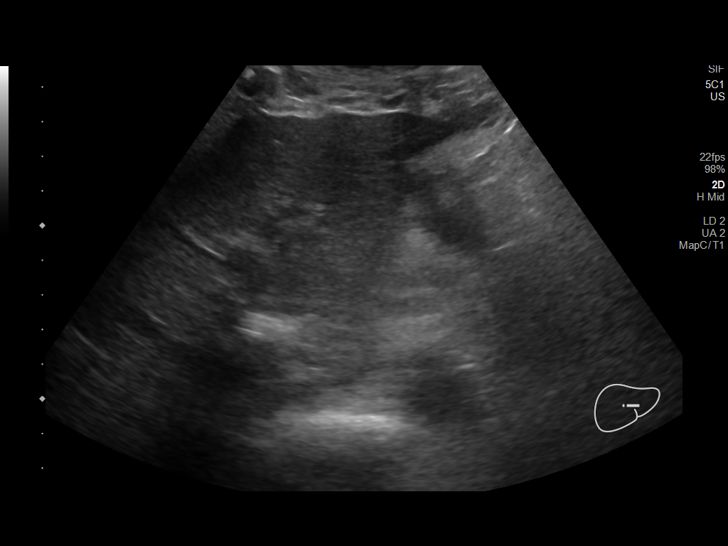
[im 45/72]
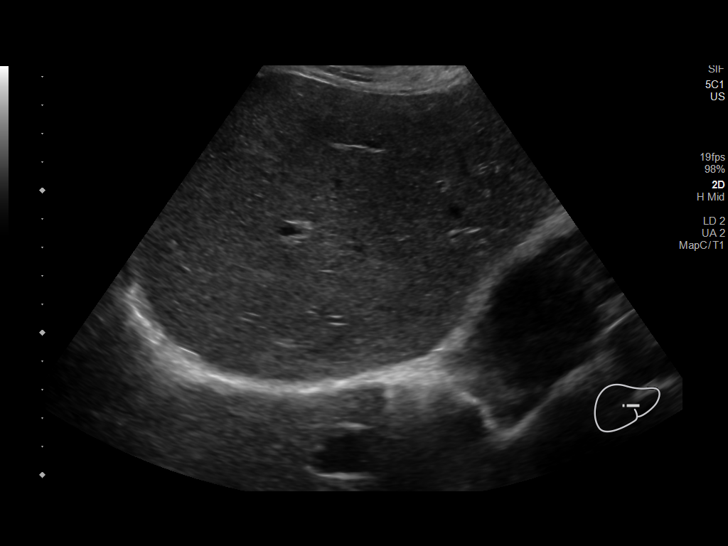
[im 48/72]
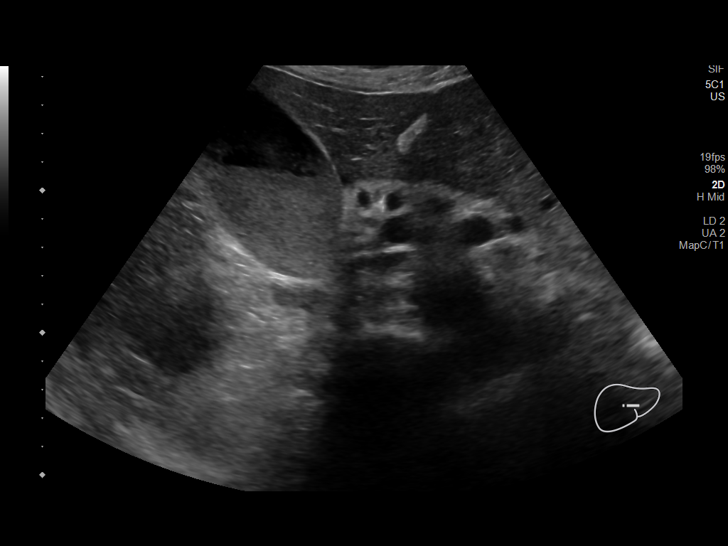
[im 54/72]
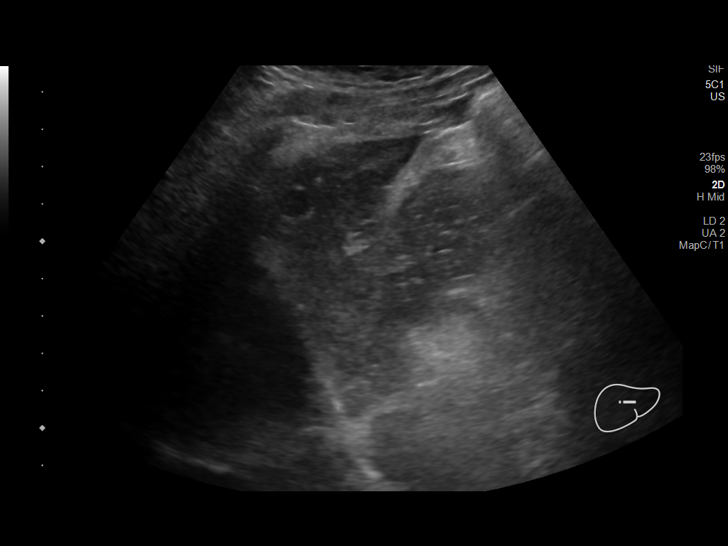
[im 60/72]
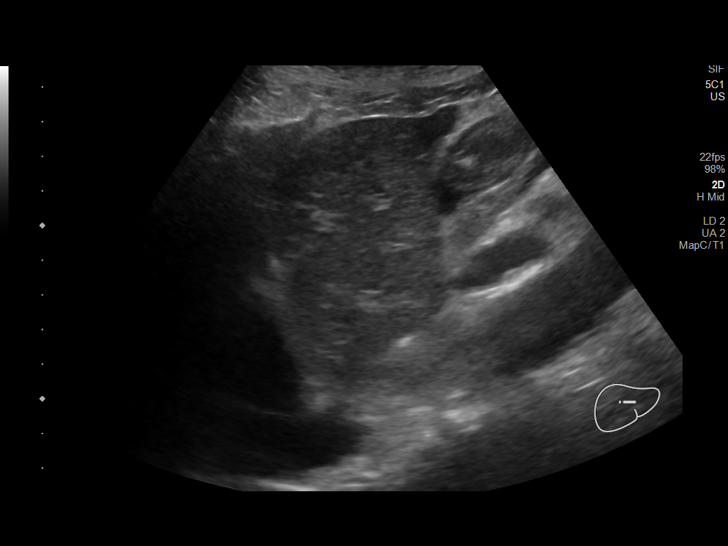
[im 66/72]
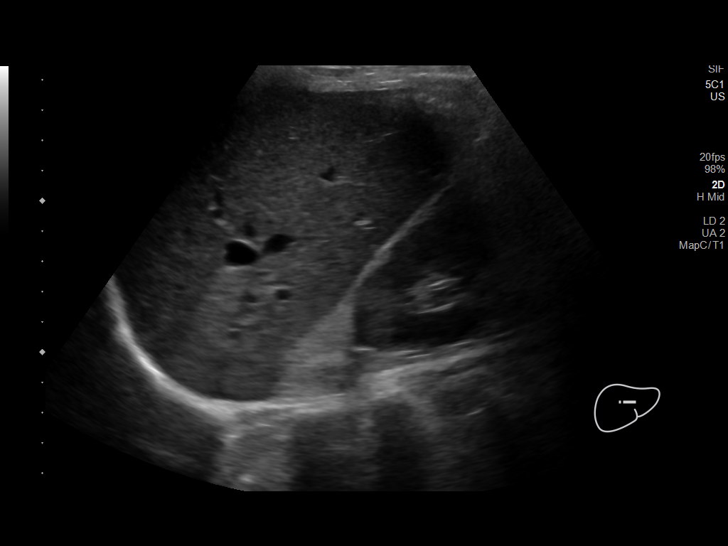
[im 72/72]
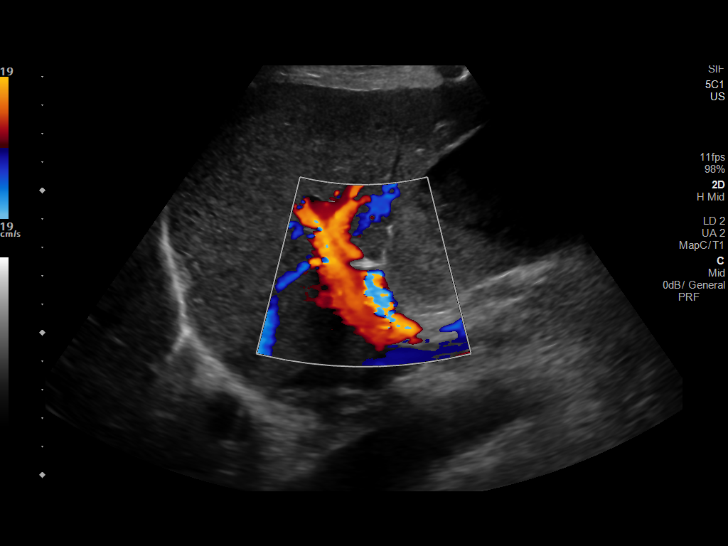

[14 of 25 positions shown; findings below may reference images not displayed]

FINDINGS: Gallbladder:

Slightly distended. Moderate sludge in the gallbladder. No shadowing
stones. Normal wall thickness. Negative sonographic Murphy

Common bile duct:

Diameter: 6 mm

Liver:

Liver is slightly echogenic. Multiple cysts within the liver. The
largest is seen in the posterior right hepatic lobe and measures
x 1.8 x 1.6 cm. Portal vein is patent on color Doppler imaging with
normal direction of blood flow towards the liver.

Other: None.
IMPRESSION: 1. Slightly distended gallbladder with sludge, but negative for wall
thickness, sonographic Murphy, or other features to suggest acute
gallbladder disease. Common duct diameter upper limits of normal
2. Slightly echogenic liver with multiple cysts

## 2021-06-11 IMAGING — DX DG CHEST 1V PORT
1 series · 1 of 1 positions shown · non-contrast
Comparison: None.

CLINICAL DATA: Weakness

EXAM:
PORTABLE CHEST 1 VIEW

[chest ap]
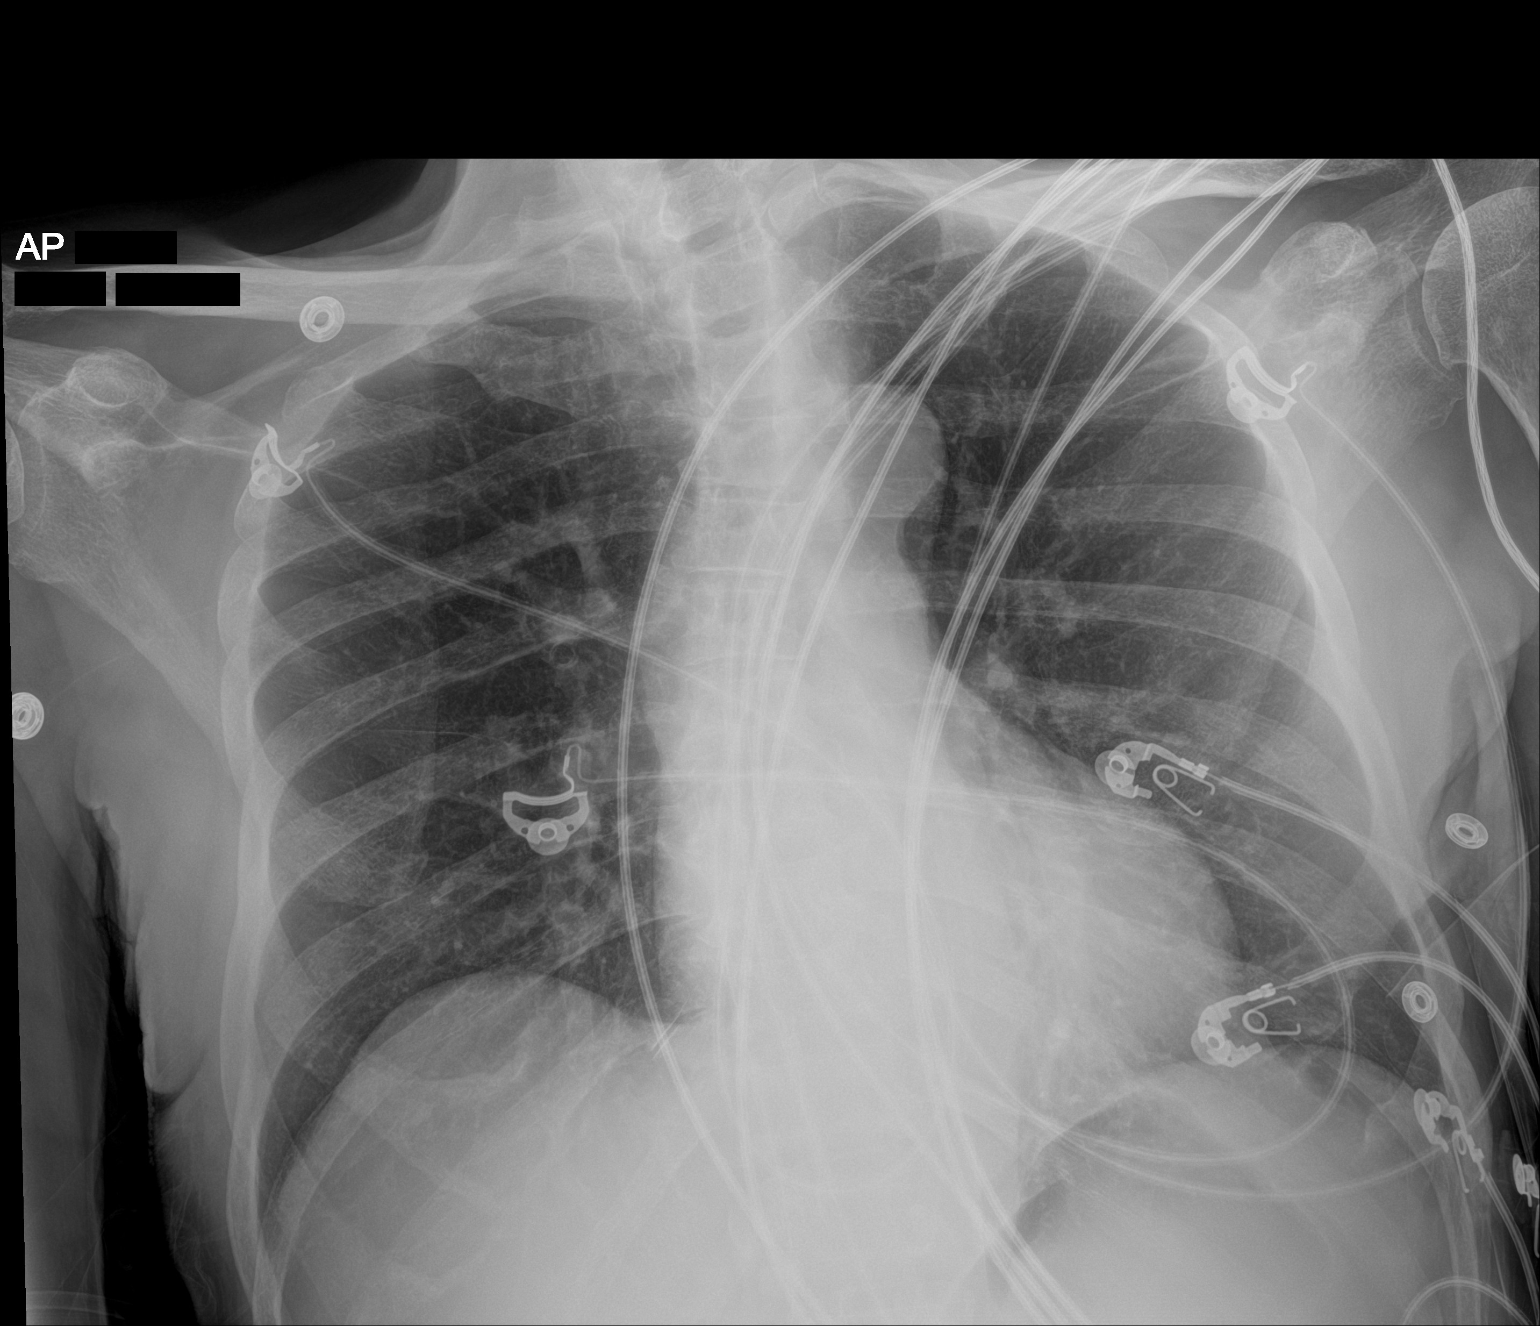

[1 of 1 positions shown; findings below may reference images not displayed]

FINDINGS: The heart size and mediastinal contours are within normal limits. No
focal airspace consolidation, pleural effusion, or pneumothorax. The
visualized skeletal structures are unremarkable.
IMPRESSION: No active disease.

## 2021-06-20 IMAGING — CT CT BIOPSY AND ASPIRATION BONE MARROW
1 of 2 series · 15 of 32 positions shown, 19 images · non-contrast
Comparison: none

INDICATION: 73-year-old with anemia and thrombocytopenia. Abnormal appearance of
the bone marrow on MRI.

[Series 2: i-spiral 5.0 b40f · axial · 0.89mm/px · z∈[+894,+1002]mm · 15 of 35 slices shown, 19 images]
[im 2/35  soft-tissue]
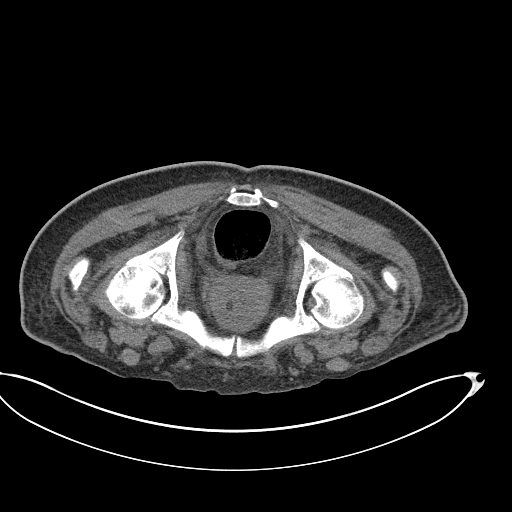
[im 2/35  bone]
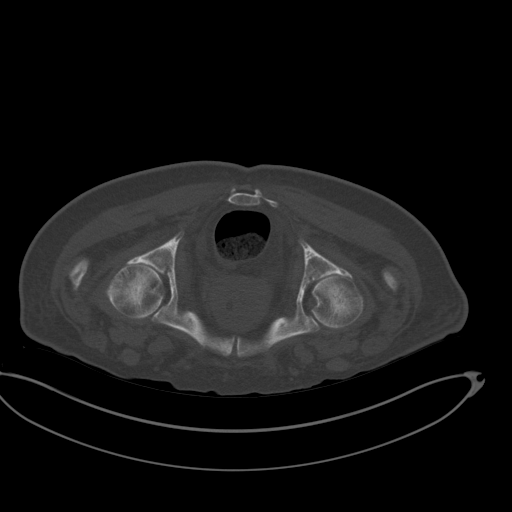
[im 5/35  soft-tissue]
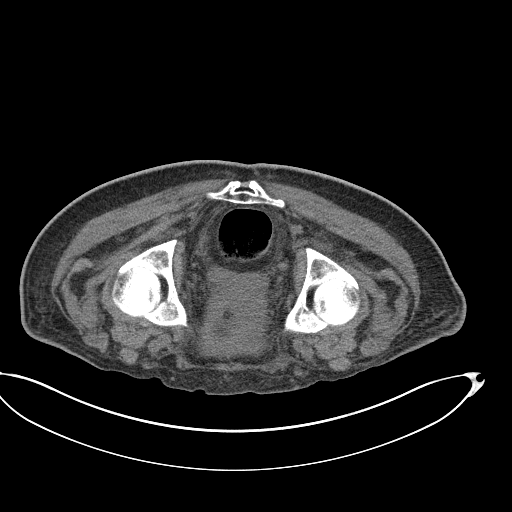
[im 8/35  soft-tissue]
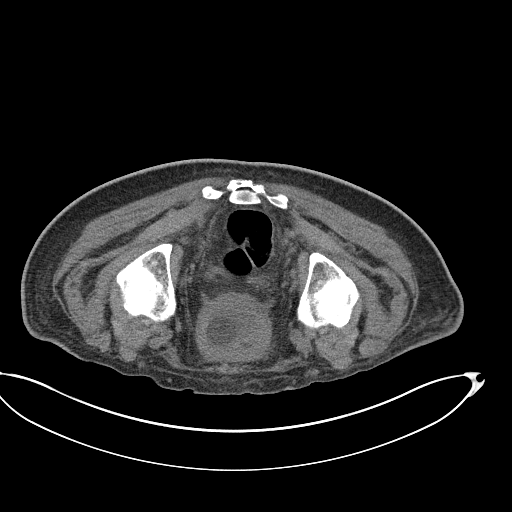
[im 9/35  soft-tissue]
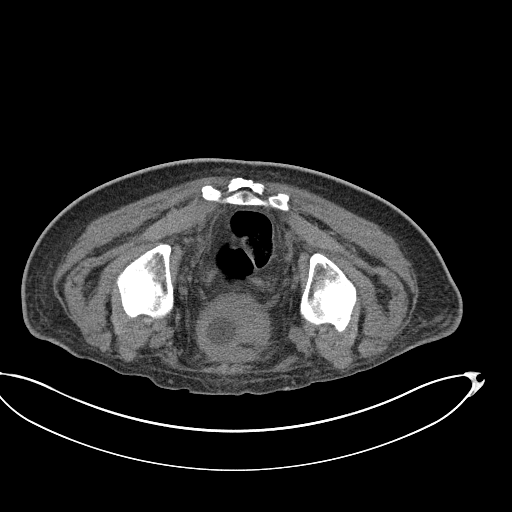
[im 12/35  soft-tissue]
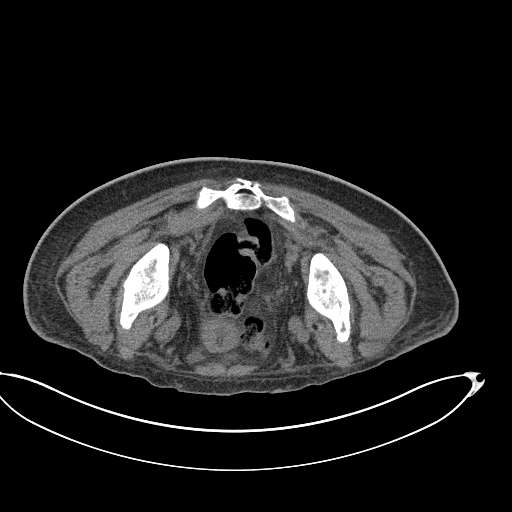
[im 15/35  soft-tissue]
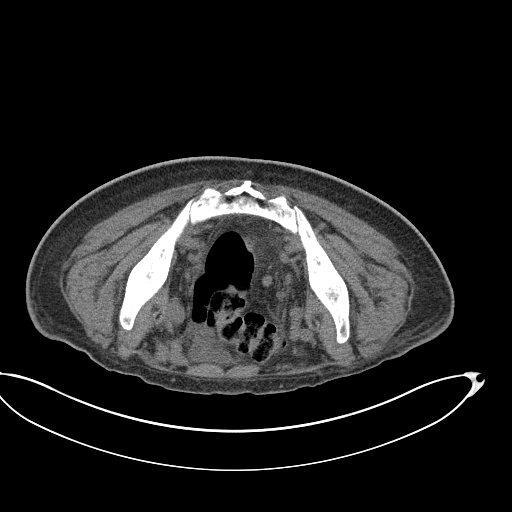
[im 18/35  soft-tissue]
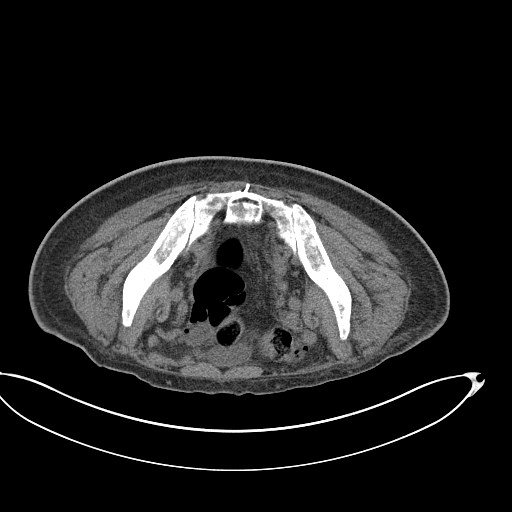
[im 20/35  soft-tissue]
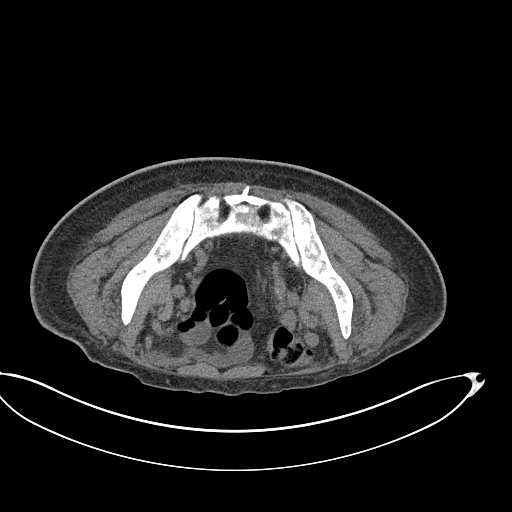
[im 23/35  soft-tissue]
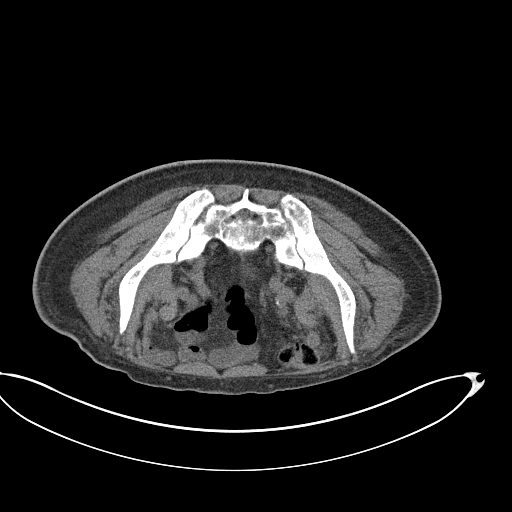
[im 23/35  bone]
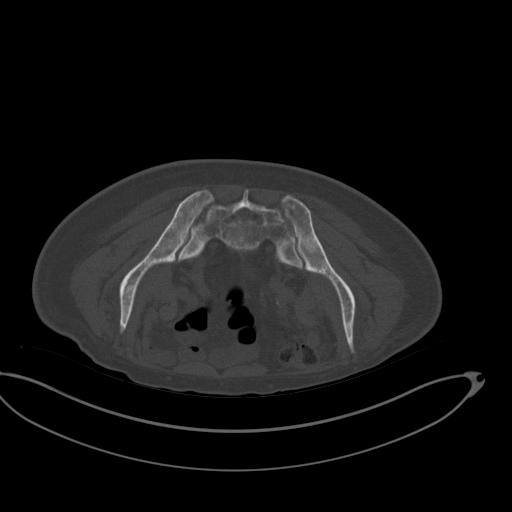
[im 26/35  soft-tissue]
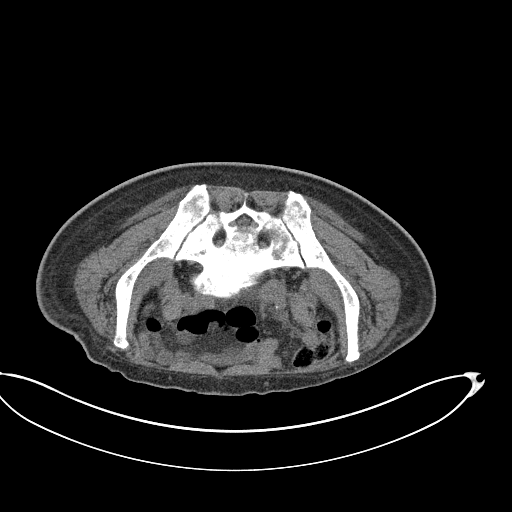
[im 27/35  soft-tissue]
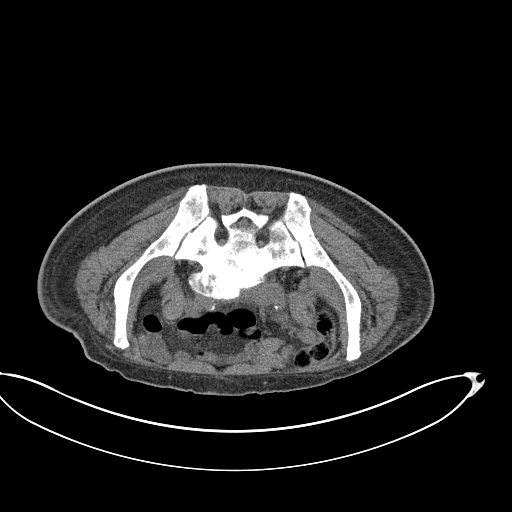
[im 29/35  lung]
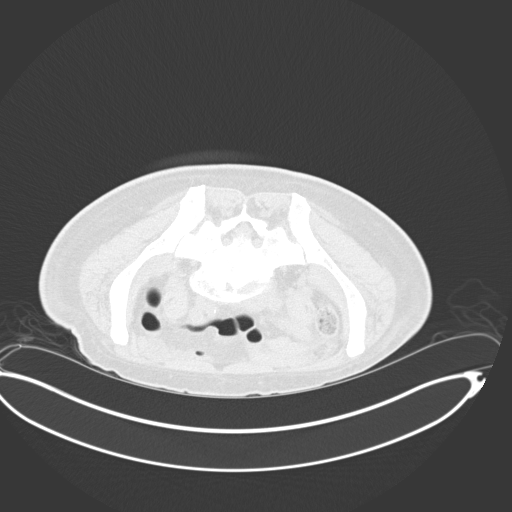
[im 30/35  soft-tissue]
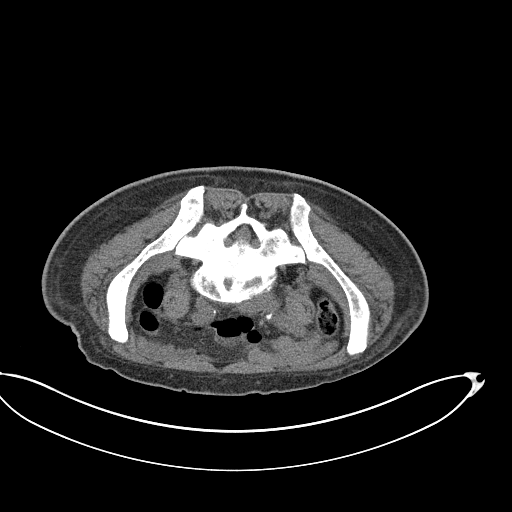
[im 30/35  lung]
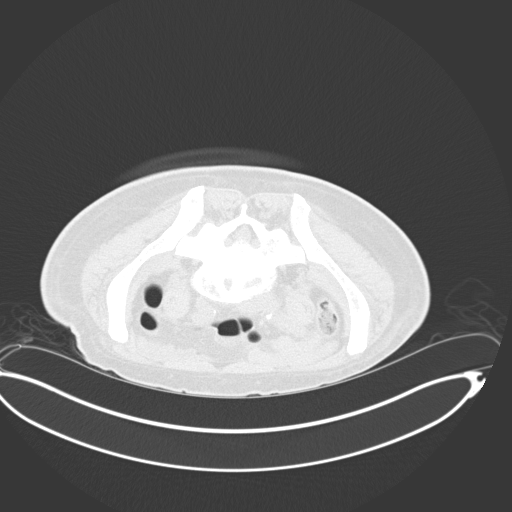
[im 32/35  lung]
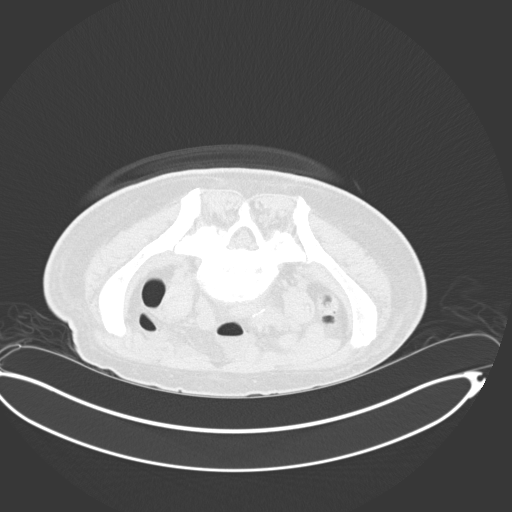
[im 33/35  soft-tissue]
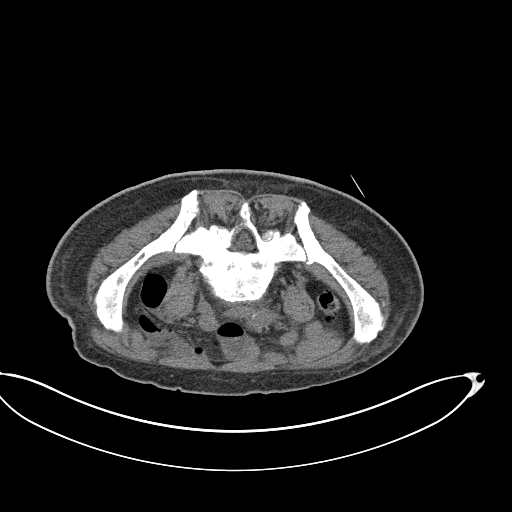
[im 33/35  lung]
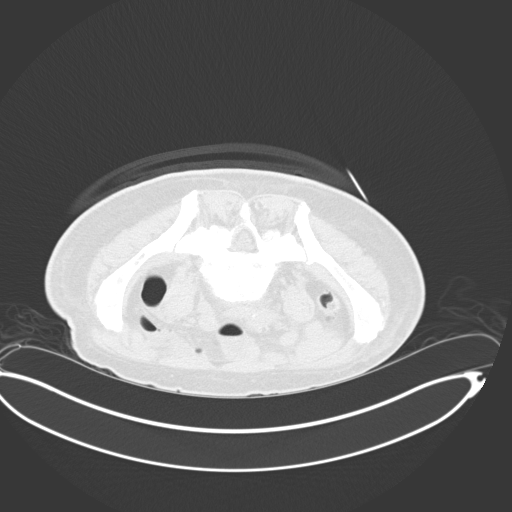

[15 of 32 positions shown; findings below may reference images not displayed]

EXAM:
CT GUIDED BONE MARROW ASPIRATES AND BIOPSY

MEDICATIONS:
None.

ANESTHESIA/SEDATION:
Fentanyl 50 mcg IV; Versed 0.5 mg IV

Moderate Sedation Time:  17 minutes

The patient was continuously monitored during the procedure by the
interventional radiology nurse under my direct supervision.

COMPLICATIONS:
None immediate.

PROCEDURE:
The procedure was explained to the patient. The risks and benefits
of the procedure were discussed and the patient's questions were
addressed. Informed consent was obtained from the patient. The
patient was placed prone on CT table. Images of the pelvis were
obtained. The right side of back was prepped and draped in sterile
fashion. The skin and right posterior ilium were anesthetized with
1% lidocaine. 11 gauge bone needle was directed into the right ilium
with CT guidance. Initially, no aspirate could be obtained.
Therefore, a core biopsy was obtained. Needle was directed back into
the right ilium and approximately 1-2 mL of aspirate could be
obtained. Second core biopsy was obtained. Bandage placed over the
puncture site.
FINDINGS: Pelvic bones are very heterogeneous with scattered areas of
sclerosis. Minimal aspirate could be obtained from the bone marrow.
Two adequate core biopsies were obtained.
IMPRESSION: CT guided bone marrow aspiration and core biopsy.

## 2021-08-11 IMAGING — DX DG CHEST 1V PORT
1 series · 1 of 1 positions shown · non-contrast
Comparison: 03/01/2020

CLINICAL DATA: Dyspnea

EXAM:
PORTABLE CHEST 1 VIEW

[chest ap]
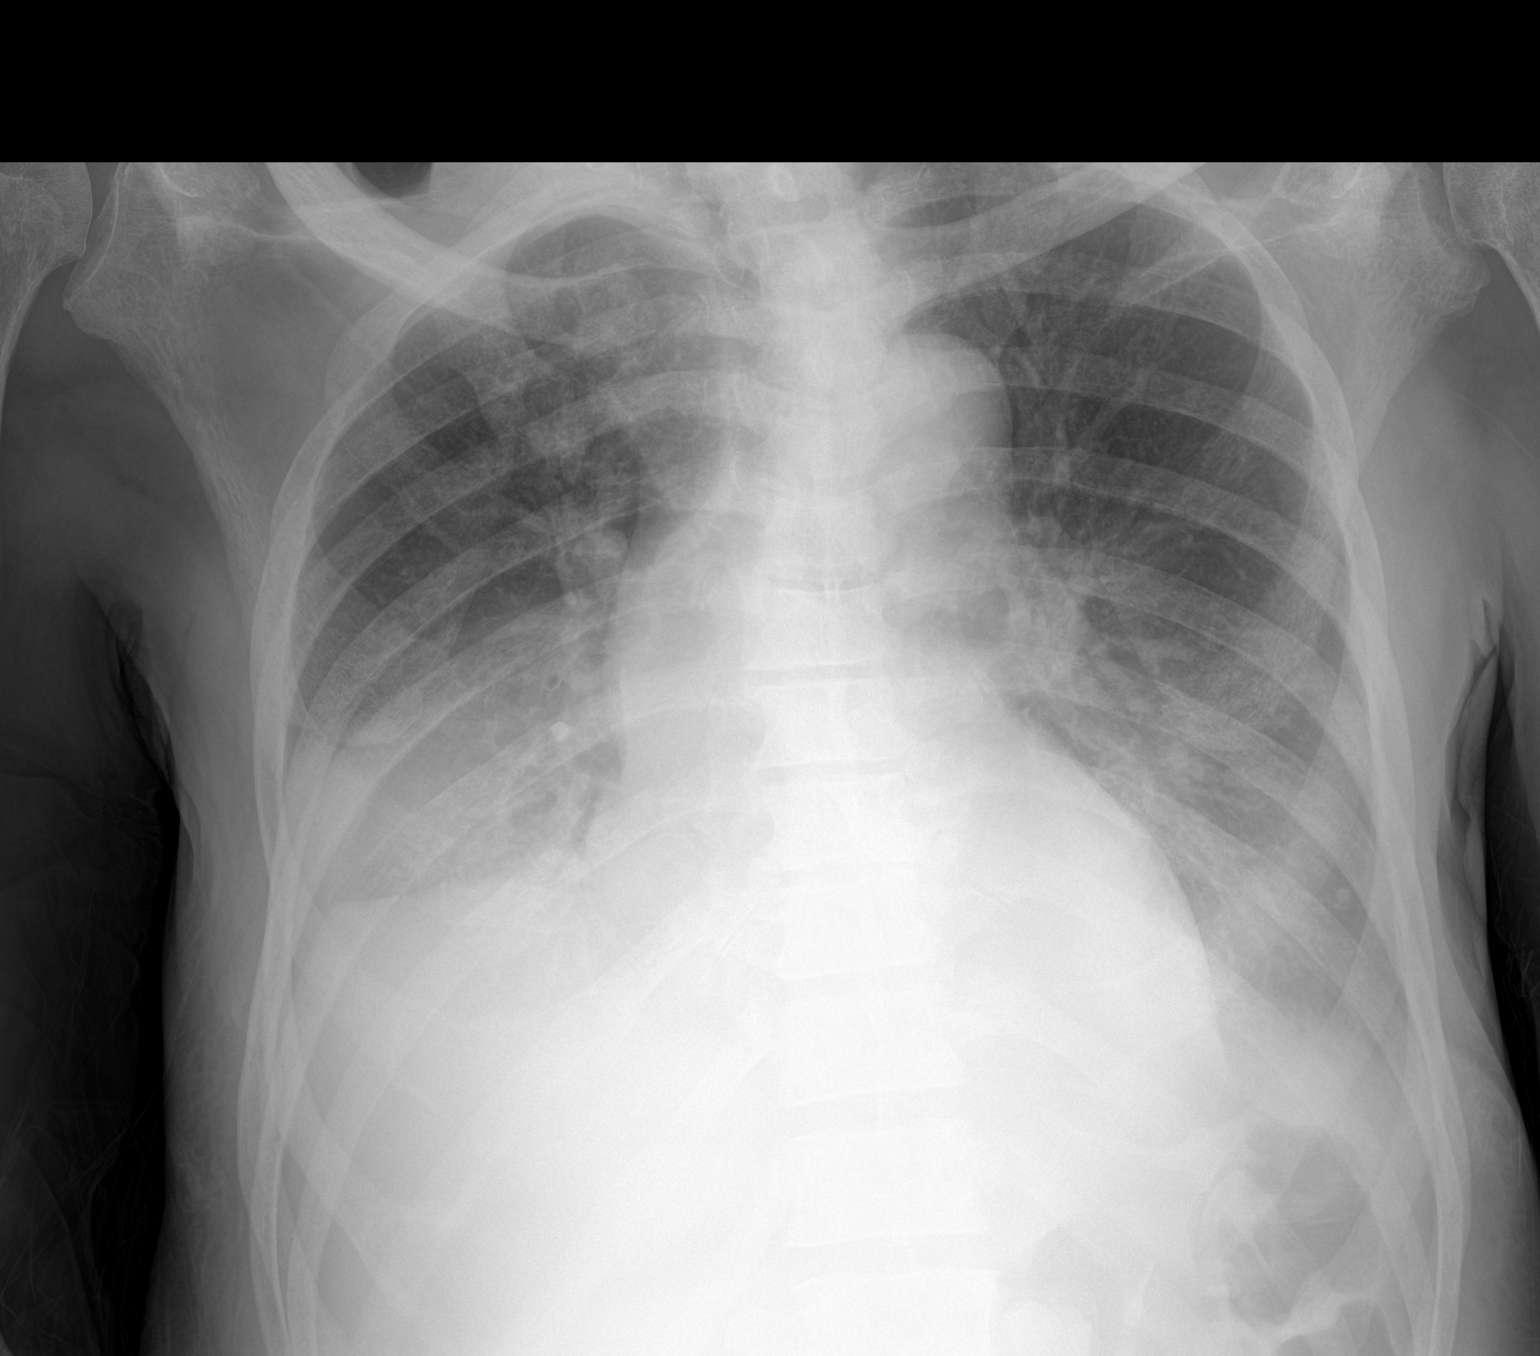

[1 of 1 positions shown; findings below may reference images not displayed]

FINDINGS: Bilateral small pleural effusions. Mild bilateral interstitial
thickening. No pneumothorax. Bibasilar atelectasis. Stable
cardiomediastinal silhouette. No aggressive osseous lesion.
IMPRESSION: Bilateral small pleural effusions with bibasilar atelectasis.
